# Patient Record
Sex: Male | Born: 1940 | Race: Black or African American | Hispanic: No | Smoking: Former smoker
Health system: Southern US, Community
[De-identification: ages and names within clinical notes are randomized; demographics above are authoritative.]

## PROBLEM LIST (undated history)

## (undated) DIAGNOSIS — K56609 Unspecified intestinal obstruction, unspecified as to partial versus complete obstruction: Secondary | ICD-10-CM

## (undated) DIAGNOSIS — R7303 Prediabetes: Secondary | ICD-10-CM

## (undated) DIAGNOSIS — I700 Atherosclerosis of aorta: Secondary | ICD-10-CM

## (undated) DIAGNOSIS — I10 Essential (primary) hypertension: Secondary | ICD-10-CM

## (undated) DIAGNOSIS — N401 Enlarged prostate with lower urinary tract symptoms: Secondary | ICD-10-CM

## (undated) DIAGNOSIS — E663 Overweight: Secondary | ICD-10-CM

## (undated) DIAGNOSIS — D573 Sickle-cell trait: Secondary | ICD-10-CM

## (undated) DIAGNOSIS — C61 Malignant neoplasm of prostate: Secondary | ICD-10-CM

## (undated) DIAGNOSIS — I493 Ventricular premature depolarization: Secondary | ICD-10-CM

## (undated) DIAGNOSIS — E785 Hyperlipidemia, unspecified: Secondary | ICD-10-CM

## (undated) DIAGNOSIS — H409 Unspecified glaucoma: Secondary | ICD-10-CM

## (undated) DIAGNOSIS — N400 Enlarged prostate without lower urinary tract symptoms: Secondary | ICD-10-CM

## (undated) DIAGNOSIS — E7800 Pure hypercholesterolemia, unspecified: Secondary | ICD-10-CM

## (undated) DIAGNOSIS — N138 Other obstructive and reflux uropathy: Secondary | ICD-10-CM

## (undated) DIAGNOSIS — Z86010 Personal history of colonic polyps: Secondary | ICD-10-CM

## (undated) DIAGNOSIS — T7840XA Allergy, unspecified, initial encounter: Secondary | ICD-10-CM

## (undated) DIAGNOSIS — L821 Other seborrheic keratosis: Secondary | ICD-10-CM

## (undated) DIAGNOSIS — Z860101 Personal history of adenomatous and serrated colon polyps: Secondary | ICD-10-CM

## (undated) DIAGNOSIS — N62 Hypertrophy of breast: Secondary | ICD-10-CM

## (undated) DIAGNOSIS — N189 Chronic kidney disease, unspecified: Secondary | ICD-10-CM

## (undated) DIAGNOSIS — I739 Peripheral vascular disease, unspecified: Secondary | ICD-10-CM

## (undated) DIAGNOSIS — F419 Anxiety disorder, unspecified: Secondary | ICD-10-CM

## (undated) DIAGNOSIS — J309 Allergic rhinitis, unspecified: Secondary | ICD-10-CM

## (undated) DIAGNOSIS — I509 Heart failure, unspecified: Secondary | ICD-10-CM

## (undated) DIAGNOSIS — G4730 Sleep apnea, unspecified: Secondary | ICD-10-CM

## (undated) DIAGNOSIS — R339 Retention of urine, unspecified: Secondary | ICD-10-CM

## (undated) DIAGNOSIS — J069 Acute upper respiratory infection, unspecified: Secondary | ICD-10-CM

## (undated) DIAGNOSIS — M549 Dorsalgia, unspecified: Secondary | ICD-10-CM

## (undated) HISTORY — DX: Prediabetes: R73.03

## (undated) HISTORY — DX: Hyperlipidemia, unspecified: E78.5

## (undated) HISTORY — DX: Overweight: E66.3

## (undated) HISTORY — DX: Atherosclerosis of aorta: I70.0

## (undated) HISTORY — DX: Hypertrophy of breast: N62

## (undated) HISTORY — DX: Allergy, unspecified, initial encounter: T78.40XA

## (undated) HISTORY — DX: Ventricular premature depolarization: I49.3

## (undated) HISTORY — PX: OTHER SURGICAL HISTORY: SHX169

## (undated) HISTORY — DX: Personal history of colonic polyps: Z86.010

## (undated) HISTORY — PX: POLYPECTOMY: SHX149

## (undated) HISTORY — PX: COLONOSCOPY: SHX174

## (undated) HISTORY — DX: Allergic rhinitis, unspecified: J30.9

## (undated) HISTORY — DX: Heart failure, unspecified: I50.9

## (undated) HISTORY — PX: COLONOSCOPY W/ BIOPSIES AND POLYPECTOMY: SHX1376

## (undated) HISTORY — PX: REFRACTIVE SURGERY: SHX103

## (undated) HISTORY — DX: Other seborrheic keratosis: L82.1

## (undated) HISTORY — DX: Anxiety disorder, unspecified: F41.9

## (undated) HISTORY — DX: Sickle-cell trait: D57.3

## (undated) HISTORY — DX: Peripheral vascular disease, unspecified: I73.9

## (undated) HISTORY — DX: Personal history of adenomatous and serrated colon polyps: Z86.0101

## (undated) HISTORY — DX: Benign prostatic hyperplasia without lower urinary tract symptoms: N40.0

## (undated) HISTORY — DX: Dorsalgia, unspecified: M54.9

## (undated) HISTORY — DX: Essential (primary) hypertension: I10

## (undated) HISTORY — DX: Pure hypercholesterolemia, unspecified: E78.00

## (undated) HISTORY — DX: Unspecified intestinal obstruction, unspecified as to partial versus complete obstruction: K56.609

## (undated) HISTORY — DX: Chronic kidney disease, unspecified: N18.9

## (undated) HISTORY — DX: Acute upper respiratory infection, unspecified: J06.9

---

## 2001-02-12 HISTORY — PX: PROSTATE BIOPSY: SHX241

## 2001-05-02 ENCOUNTER — Ambulatory Visit: Admission: RE | Admit: 2001-05-02 | Discharge: 2001-07-31 | Payer: Self-pay | Admitting: Radiation Oncology

## 2001-05-12 ENCOUNTER — Ambulatory Visit (HOSPITAL_COMMUNITY): Admission: RE | Admit: 2001-05-12 | Discharge: 2001-05-12 | Payer: Self-pay | Admitting: Urology

## 2004-07-11 ENCOUNTER — Ambulatory Visit: Payer: Self-pay | Admitting: Pulmonary Disease

## 2006-01-15 ENCOUNTER — Ambulatory Visit: Payer: Self-pay | Admitting: Pulmonary Disease

## 2006-02-19 ENCOUNTER — Ambulatory Visit: Payer: Self-pay | Admitting: Pulmonary Disease

## 2006-03-20 ENCOUNTER — Ambulatory Visit: Payer: Self-pay | Admitting: Gastroenterology

## 2006-04-15 ENCOUNTER — Encounter (INDEPENDENT_AMBULATORY_CARE_PROVIDER_SITE_OTHER): Payer: Self-pay | Admitting: *Deleted

## 2006-04-15 ENCOUNTER — Ambulatory Visit: Payer: Self-pay | Admitting: Gastroenterology

## 2006-11-22 ENCOUNTER — Ambulatory Visit: Payer: Self-pay | Admitting: Pulmonary Disease

## 2006-11-22 DIAGNOSIS — D126 Benign neoplasm of colon, unspecified: Secondary | ICD-10-CM | POA: Insufficient documentation

## 2006-11-22 DIAGNOSIS — C61 Malignant neoplasm of prostate: Secondary | ICD-10-CM

## 2006-11-22 DIAGNOSIS — K589 Irritable bowel syndrome without diarrhea: Secondary | ICD-10-CM | POA: Insufficient documentation

## 2006-11-22 DIAGNOSIS — F411 Generalized anxiety disorder: Secondary | ICD-10-CM | POA: Insufficient documentation

## 2006-11-22 DIAGNOSIS — N62 Hypertrophy of breast: Secondary | ICD-10-CM | POA: Insufficient documentation

## 2006-11-22 DIAGNOSIS — K649 Unspecified hemorrhoids: Secondary | ICD-10-CM | POA: Insufficient documentation

## 2007-01-23 ENCOUNTER — Telehealth (INDEPENDENT_AMBULATORY_CARE_PROVIDER_SITE_OTHER): Payer: Self-pay | Admitting: *Deleted

## 2007-02-24 ENCOUNTER — Encounter: Payer: Self-pay | Admitting: Pulmonary Disease

## 2007-04-03 ENCOUNTER — Ambulatory Visit: Payer: Self-pay | Admitting: Pulmonary Disease

## 2007-10-28 ENCOUNTER — Encounter: Payer: Self-pay | Admitting: Pulmonary Disease

## 2008-01-22 ENCOUNTER — Ambulatory Visit: Payer: Self-pay | Admitting: Pulmonary Disease

## 2008-01-22 DIAGNOSIS — I499 Cardiac arrhythmia, unspecified: Secondary | ICD-10-CM

## 2008-01-22 DIAGNOSIS — I10 Essential (primary) hypertension: Secondary | ICD-10-CM | POA: Insufficient documentation

## 2008-01-23 ENCOUNTER — Ambulatory Visit: Payer: Self-pay | Admitting: Pulmonary Disease

## 2008-01-25 LAB — CONVERTED CEMR LAB
AST: 21 units/L (ref 0–37)
Albumin: 3.8 g/dL (ref 3.5–5.2)
Basophils Absolute: 0 10*3/uL (ref 0.0–0.1)
Bilirubin, Direct: 0.1 mg/dL (ref 0.0–0.3)
Chloride: 105 meq/L (ref 96–112)
Creatinine, Ser: 1.4 mg/dL (ref 0.4–1.5)
Direct LDL: 140.5 mg/dL
Eosinophils Absolute: 0.1 10*3/uL (ref 0.0–0.7)
Eosinophils Relative: 1 % (ref 0.0–5.0)
GFR calc non Af Amer: 54 mL/min
Hemoglobin: 14.2 g/dL (ref 13.0–17.0)
Lymphocytes Relative: 40.2 % (ref 12.0–46.0)
MCV: 80.1 fL (ref 78.0–100.0)
Monocytes Absolute: 0.3 10*3/uL (ref 0.1–1.0)
Monocytes Relative: 6.3 % (ref 3.0–12.0)
Neutro Abs: 2.8 10*3/uL (ref 1.4–7.7)
Neutrophils Relative %: 52.4 % (ref 43.0–77.0)
Potassium: 4.2 meq/L (ref 3.5–5.1)
Sodium: 141 meq/L (ref 135–145)
Total CHOL/HDL Ratio: 5.4
Triglycerides: 52 mg/dL (ref 0–149)
WBC: 5.3 10*3/uL (ref 4.5–10.5)

## 2008-09-02 ENCOUNTER — Ambulatory Visit: Payer: Self-pay | Admitting: Pulmonary Disease

## 2008-09-02 DIAGNOSIS — E7800 Pure hypercholesterolemia, unspecified: Secondary | ICD-10-CM

## 2008-09-02 DIAGNOSIS — M5380 Other specified dorsopathies, site unspecified: Secondary | ICD-10-CM | POA: Insufficient documentation

## 2008-10-07 ENCOUNTER — Telehealth: Payer: Self-pay | Admitting: Pulmonary Disease

## 2008-10-11 LAB — CONVERTED CEMR LAB
BUN: 21 mg/dL (ref 6–23)
Basophils Absolute: 0 10*3/uL (ref 0.0–0.1)
CO2: 30 meq/L (ref 19–32)
Cholesterol: 177 mg/dL (ref 0–200)
Eosinophils Absolute: 0.1 10*3/uL (ref 0.0–0.7)
Glucose, Bld: 91 mg/dL (ref 70–99)
HCT: 41.7 % (ref 39.0–52.0)
HDL: 39.9 mg/dL (ref >39.00)
Lymphocytes Relative: 40.9 % (ref 12.0–46.0)
Lymphs Abs: 2.1 10*3/uL (ref 0.7–4.0)
MCV: 81.1 fL (ref 78.0–100.0)
Monocytes Absolute: 0.3 10*3/uL (ref 0.1–1.0)
Monocytes Relative: 6.4 % (ref 3.0–12.0)
Sodium: 145 meq/L (ref 135–145)
Total Bilirubin: 0.9 mg/dL (ref 0.3–1.2)
Total CHOL/HDL Ratio: 4
Triglycerides: 50 mg/dL (ref 0.0–149.0)
VLDL: 10 mg/dL (ref 0.0–40.0)
WBC: 5.1 10*3/uL (ref 4.5–10.5)

## 2008-11-19 ENCOUNTER — Encounter: Payer: Self-pay | Admitting: Pulmonary Disease

## 2009-04-20 ENCOUNTER — Encounter (INDEPENDENT_AMBULATORY_CARE_PROVIDER_SITE_OTHER): Payer: Self-pay | Admitting: *Deleted

## 2009-04-28 ENCOUNTER — Ambulatory Visit: Payer: Self-pay | Admitting: Pulmonary Disease

## 2009-04-30 LAB — CONVERTED CEMR LAB
ALT: 24 units/L (ref 0–53)
AST: 23 units/L (ref 0–37)
Alkaline Phosphatase: 76 units/L (ref 39–117)
Basophils Relative: 0.5 % (ref 0.0–3.0)
Chloride: 107 meq/L (ref 96–112)
Creatinine, Ser: 1.4 mg/dL (ref 0.4–1.5)
Eosinophils Absolute: 0.1 10*3/uL (ref 0.0–0.7)
Glucose, Bld: 92 mg/dL (ref 70–99)
HCT: 42.6 % (ref 39.0–52.0)
LDL Cholesterol: 131 mg/dL — ABNORMAL HIGH (ref 0–99)
Lymphocytes Relative: 38 % (ref 12.0–46.0)
MCHC: 32.9 g/dL (ref 30.0–36.0)
MCV: 81.2 fL (ref 78.0–100.0)
Monocytes Relative: 6.2 % (ref 3.0–12.0)
Platelets: 147 10*3/uL — ABNORMAL LOW (ref 150.0–400.0)
Potassium: 3.8 meq/L (ref 3.5–5.1)
RBC: 5.25 M/uL (ref 4.22–5.81)
RDW: 13.8 % (ref 11.5–14.6)
Sodium: 143 meq/L (ref 135–145)
Total CHOL/HDL Ratio: 4
Triglycerides: 64 mg/dL (ref 0.0–149.0)
VLDL: 12.8 mg/dL (ref 0.0–40.0)

## 2009-05-02 ENCOUNTER — Telehealth (INDEPENDENT_AMBULATORY_CARE_PROVIDER_SITE_OTHER): Payer: Self-pay | Admitting: *Deleted

## 2009-05-02 ENCOUNTER — Encounter (INDEPENDENT_AMBULATORY_CARE_PROVIDER_SITE_OTHER): Payer: Self-pay | Admitting: *Deleted

## 2009-05-09 ENCOUNTER — Encounter (INDEPENDENT_AMBULATORY_CARE_PROVIDER_SITE_OTHER): Payer: Self-pay

## 2009-05-10 ENCOUNTER — Ambulatory Visit: Payer: Self-pay | Admitting: Gastroenterology

## 2009-05-25 ENCOUNTER — Encounter: Payer: Self-pay | Admitting: Pulmonary Disease

## 2009-06-20 ENCOUNTER — Telehealth: Payer: Self-pay | Admitting: Pulmonary Disease

## 2009-11-16 ENCOUNTER — Telehealth: Payer: Self-pay | Admitting: Gastroenterology

## 2009-12-13 ENCOUNTER — Ambulatory Visit: Payer: Self-pay | Admitting: Pulmonary Disease

## 2009-12-26 ENCOUNTER — Telehealth: Payer: Self-pay | Admitting: Pulmonary Disease

## 2009-12-28 ENCOUNTER — Ambulatory Visit: Payer: Self-pay | Admitting: Cardiovascular Disease

## 2009-12-28 ENCOUNTER — Ambulatory Visit: Payer: Self-pay

## 2009-12-28 ENCOUNTER — Ambulatory Visit (HOSPITAL_COMMUNITY): Admission: RE | Admit: 2009-12-28 | Discharge: 2009-12-28 | Payer: Self-pay

## 2009-12-28 ENCOUNTER — Encounter: Payer: Self-pay | Admitting: Pulmonary Disease

## 2010-01-23 ENCOUNTER — Telehealth: Payer: Self-pay | Admitting: Pulmonary Disease

## 2010-02-10 ENCOUNTER — Telehealth: Payer: Self-pay | Admitting: Pulmonary Disease

## 2010-02-15 ENCOUNTER — Encounter: Payer: Self-pay | Admitting: Pulmonary Disease

## 2010-03-14 NOTE — Assessment & Plan Note (Signed)
Summary: cpx ///kp   CC:  8 month ROV & review of mult medical problems....  History of Present Illness: 70 y/o BM here for a follow up visit... he has multiple medical problems as noted below...    ~  he is followed Q46mo by the Urology division at [facility] for prostate cancer- bx 1/03 was + for sm volume prostate cancer, subseq bx 12/04 was neg, on active surveillance w/ PSA's in the 10-12 range and stable... last seen 9/09 & their note is reviewed... he's had some nocturia and freq- INTOL to Flomax & they have rec RAPAFLO but he hasn't tried it yet...   ~  April 28, 2009:  he saw DrBorden 10/10 w/ PSA=13, he wanted to repeat bx but pt declined, f/u eval sched for 4/11... BP has been borderline elevated but pt declined med Rx... we discussed further eval w/ 2DEcho to help him decide if med Rx indicated or not... he denies symptoms- no CP. palpit, SOB, etc... he is also due for f/u colonoscopy by DrJacobs (colon 3/08 w/ 10mm adenomatous polyp removed)...     Problem List:  HYPERTENSION (ICD-401.9) - hx borderline HBP prev Rx w/ Amlodipine5 but he didn't tolerate- c/o nightmares... he prefers to control this naturally w/ herbs, diet, low sodium, etc... home BP checks prev in the 140's/ 80's are now 130's/ 70's by his history...  ~  3/11:  BP= 150/90 today & he denies HA, fatigue, visual changes, CP, palipit, dizziness, syncope, dyspnea, edema, etc... we discussed 2DEcho to eval for LVH.  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  HYPERCHOLESTEROLEMIA, MILD (ICD-272.0) - hx mild elevation of TChol & LDL in the past- he prefers to control w/ diet + exercise therapy and doesn't want statin med Rx...   ~  FLP 12/09 showed TChol 207, Tg 52, HDL 38, LDL 141... he prefers diet.  ~  FLP 7/10 showed TChol 177, TG 50, HDL 40,  LDL 127... improved, continue diet Rx.  ~  FLP 3/11 showed TChol 191, TG 64, HDL 47, LDL 131     Hx of IRRITABLE BOWEL SYNDROME (ICD-564.1)  ADENOMATOUS COLONIC POLYP (ICD-211.3) - Colonoscopy by [facility] 3/08 w/ 10mm polyp in asc colon, removed w/ path= tubular adenoma;  repeat colon due 70yrs.  ~  7/10: he denies nausea, vomiting, heartburn, diarrhea, constipation, blood in stool, abdominal pain, swelling, gas...  ~  3/11:  repeat colonoscopy due per DrJacobs- refer to GI.  Hx of HEMORRHOIDS (ICD-455.6) - Hx hemorroid surg by DrGerkin 10/03     PROSTATE CANCER (ICD-185) - Dx w/ Prostate Ca 1/03 by DrPeterson & offered surg vs seeds;  second opinion w/ DrDalstedt 2/03 w/ long discussion re: options;  discussed seed Rx w/ DrGoodchild 3/03;  pt preferred herbal Rx- and takes [employer] & Immune Enhancers;  4th opinion w/ Dr Sinclair Grooms @ [facility] 8/04 w/ repeat bx which was neg & decision made for "watchful waiting" w/ q75mo follow up in [facility]- last seen 9/09 and PSA was 10.3.Marland Kitchen.  ~  7/10: pt informs me that he is no longer going to [facility] and wants a Emergency planning/management officer... refer to DrBorden at [facility]... we did his PSA= 13.85...  ~  3/11:  pt still on watchful waiting protocol per his decision... PSA today = 18.36... refer back  to DrBorden.  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 he has been taking an "immune enhancer" from DrNeilson in [place], [place] (pt asked to bring bottle in for [place] to review).     ANXIETY (ICD-300.00)   Allergies: 1)  ! Flomax 2)  ! Amlodipine Besylate (Amlodipine Besylate)  Comments:  Nurse/Medical Assistant: The patient's medications and allergies were reviewed with the patient and were updated in the Medication and Allergy Lists.  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 Hemorrhoidectomy - DrGerkin 10/03  Family History: Reviewed history from 09/02/2008 and no changes required. mother deceased in her late 7's from stroke hx of alcohol abuse father deceased age 42 from heart disease/hx of dm and hbp 1 brother alive age 64  hx of HBP 1 sister alive age 62  hx of HBP, DM 1 sister deceased age 79 from DM and dialysis  Social History: Reviewed history from 09/02/2008 and no changes required. quit smoking in 1971  smoked for 15 yrs  1/2 pack per week married to John [name] 1 son exercises  plays tennis 2-3 times per week no alcohol use  quit in 1980 quit using drugs in 1980  Review of Systems  The patient denies anorexia, fever, weight loss, weight gain, vision loss, decreased hearing, hoarseness, chest pain, syncope, dyspnea on exertion, peripheral edema, prolonged cough, headaches, hemoptysis, abdominal pain, melena, hematochezia, severe indigestion/heartburn, hematuria, incontinence, muscle weakness, suspicious skin lesions, transient blindness, difficulty walking, depression, unusual weight change, abnormal bleeding, enlarged lymph nodes, and angioedema.    Vital Signs:  Patient profile:   70 year old male Height:      75 inches Weight:      228.13 pounds BMI:     28.62 O2 Sat:      98 % on Room air Temp:     96.9 degrees F oral Pulse rate:   62 / minute BP sitting:   150 / 90  (right arm) Cuff size:   regular  Vitals Entered By: [name] CMA (April 28, 2009 9:37 AM)  O2 Sat at Rest %:  98 O2 Flow:  Room air CC: 8 month ROV & review of mult medical problems... Is Patient Diabetic?  No Pain Assessment Patient in pain? no      Comments meds updated today   Physical Exam  Additional Exam:  WD, Overweight, 70 y/o BM in NAD... GENERAL:  Alert & oriented; pleasant & cooperative... HEENT:  [place]/AT, EOM-wnl, PERRLA, EACs-clear, TMs-wnl, NOSE-clear, THROAT-clear & wnl. NECK:  Supple w/ fairROM; no JVD; normal carotid impulses w/o bruits; no thyromegaly or nodules palpated; no lymphadenopathy. CHEST:  Clear to P & A; without wheezes/ rales/ or rhonchi. HEART:  Regular Rhythm; without murmurs/ rubs/ or gallops. ABDOMEN:  Soft & nontender; normal bowel sounds; no organomegaly or masses detected. Rectal exam is deferred... EXT: without deformities, mild arthritic changes; no varicose veins/ venous insuffic/ or edema. NEURO:  CN's intact; motor testing normal; sensory testing normal; gait normal & balance OK. DERM:  No lesions noted; no rash etc...    CXR  Procedure  date:  04/28/2009  Findings:      CHEST - 2 VIEW Comparison: 01/22/2008   Findings:  The heart size and mediastinal contours are within normal limits.  Both lungs are clear.  The visualized skeletal structures are unremarkable.   IMPRESSION: No active cardiopulmonary disease.   Read By:  Jonne Ply,  M.D.   EKG  Procedure date:  04/28/2009  Findings:      Normal sinus rhythm with rate of:  60/ min...  Tracings show poor R progression & NSSTTWA...  SN   MISC. Report  Procedure date:  04/28/2009  Findings:      Lipid Panel (LIPID)   Cholesterol               191 mg/dL                   1-610   Triglycerides             64.0 mg/dL                  9.6-045.4   HDL                       09.81 mg/dL                 >19.14   LDL Cholesterol      [H]  782 mg/dL                   9-56  BMP (METABOL)   Sodium                    143 mEq/L                   135-145   Potassium                 3.8 mEq/L                   3.5-5.1   Chloride                  107 mEq/L                    96-112   Carbon Dioxide            32 mEq/L                    19-32   Glucose                   92 mg/dL                    21-30   BUN                       17 mg/dL                    8-65   Creatinine                1.4 mg/dL                   7.8-4.6   Calcium                   9.3 mg/dL                   9.6-29.5   GFR  65.16 mL/min                >60  Hepatic/Liver Function Panel (HEPATIC)   Total Bilirubin           0.6 mg/dL                   9.1-4.7   Direct Bilirubin          0.1 mg/dL                   8.2-9.5   Alkaline Phosphatase      76 U/L                      39-117   AST                       23 U/L                      0-37   ALT                       24 U/L                      0-53   Total Protein             7.7 g/dL                    6.2-1.3   Albumin                   4.0 g/dL                    0.8-6.5  Comments:      CBC Platelet w/Diff (CBCD)   White Cell Count          5.5 K/uL                    4.5-10.5   Red Cell Count            5.25 Mil/uL                 4.22-5.81   Hemoglobin                14.0 g/dL                   78.4-69.6   Hematocrit                42.6 %                      39.0-52.0   MCV                       81.2 fl                     78.0-100.0   Platelet Count       [L]  147.0 K/uL                  150.0-400.0   Neutrophil %              54.3 %                      43.0-77.0   Lymphocyte %  38.0 %                      12.0-46.0   Monocyte %                6.2 %                       3.0-12.0   Eosinophils%              1.0 %                       0.0-5.0   Basophils %               0.5 %                       0.0-3.0  Tests: (5)   FastTSH                   1.33 uIU/mL                 0.35-5.50  Prostate Specific Antigen (PSA)   PSA-Hyb              [H]  18.36 ng/mL                 0.10-4.00   Impression & Recommendations:  Problem # 1:  HYPERTENSION (ICD-401.9) BP is borderline, but he notes  better at home... we decided to check 2DEcho for LVH & let that guide therapy which he is reluctant to accept... Orders: T-2 View CXR (71020TC) TLB-Lipid Panel (80061-LIPID) TLB-BMP (Basic Metabolic Panel-BMET) (80048-METABOL) TLB-Hepatic/Liver Function Pnl (80076-HEPATIC) TLB-CBC Platelet - w/Differential (85025-CBCD) TLB-TSH (Thyroid Stimulating Hormone) (84443-TSH) TLB-PSA (Prostate Specific Antigen) (84153-PSA) 2 D Echo (2 D Echo)  Problem # 2:  HYPERCHOLESTEROLEMIA, MILD (ICD-272.0) He prefers to control w/ diet alone and declines med Rx for this problem...  Problem # 3:  ADENOMATOUS COLONIC POLYP (ICD-211.3) Due for f/u colon by DrJacobs... Orders: Gastroenterology Referral (GI)  Problem # 4:  PROSTATE CANCER (ICD-185) Rising PSA-  needs f/u DrBorden...  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  Problem # 6:  OTHER MEDICAL PROBLEMS AS NOTED>>>  Complete Medication List: 1)  Aspirin 81 Mg Tbec (Aspirin) .... Take 1 tab by mouth once daily.Marland KitchenMarland Kitchen 2)  Multiple Vitamin Tabs (Multiple vitamin) .... He takes several vitamin supplements 3)  Immune Enhancer  .... Take as directed by drneilson...  Other Orders: EKG w/ Interpretation (93000) Urology Referral (Urology)  Patient Instructions: 1)  Today we updated your med list- see below.... 2)  Today we did your follow up CXR, EKG, & fasting blood work... please call the "phone tree" in a few days for your lab results.Marland KitchenMarland Kitchen 3)  Continue your excellent exercise program, and be sure to elim salt from your diet.Marland KitchenMarland Kitchen 4)  We will arrange for a 2DEcho of your heart & we will call you w/ these results when avail (& determine if BP meds are needed). 5)  We will also set up a f/u appt w/ DrBorden for April.Marland KitchenMarland Kitchen  6)  We will also check w/ DrJacobs about the f/u colonoscopy based on the polyp you had in 2008... 7)  Call for any questions... 8)  Please schedule a follow-up appointment  in 6 months.   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  Tracings show poor R progression & NSSTTWA...  SN

## 2010-03-14 NOTE — Progress Notes (Signed)
Summary: results/cb  Phone Note Call from Patient Call back at Home Phone [phone]   Caller: wife John [name] Call For: John [name] Summary of Call: wants results of labs drawn 04/28/2009 Initial call taken by: Lacinda Axon,  Jun 20, 2009 12:13 PM  Follow-up for Phone Call        Pt had labs done 3/17- append says PT msg recorded but this is no longer available.  Please advise results, thanks [name]  Jun 20, 2009 1:35 PM   Additional Follow-up for Phone Call Additional follow up Details #1::        per SN----i called and spoke with pts wife and she is aware of results---per SN  slightly elevated ldl on diet alone---all other labs are normal except for the psa---very high at 18.36  she stated that this was very important and that they will follow up with urology for this---they will call for appt and she is aware that a copy of these labs have been forwarded to urology back in march when the labs were done. [name] CMA  Jun 20, 2009 3:26 PM

## 2010-03-14 NOTE — Assessment & Plan Note (Signed)
Summary: rov 6 months///kp   CC:  7-8 month ROV & review of mult medical problems....  History of Present Illness: 70 y/o [name] here for a follow up visit... he has multiple medical problems as noted below...    ~  he is followed Q3mo by the Urology division at [facility] for prostate cancer- bx 1/03 was + for sm volume prostate cancer, subseq bx 12/04 was neg, on active surveillance w/ PSA's in the 10-12 range and stable... last seen 9/09 & their note is reviewed... he's had some nocturia and freq- INTOL to Flomax & they have rec RAPAFLO but he hasn't tried it yet...   ~  April 28, 2009:  he saw DrBorden 10/10 w/ PSA=13, he wanted to repeat bx but pt declined, f/u eval sched for 4/11... BP has been borderline elevated but pt declined med Rx... we discussed further eval w/ 2DEcho to help him decide if med Rx indicated or not (he never went for the Echo)... he denies symptoms- no CP, palpit, SOB, etc... he is also due for f/u colonoscopy by DrJacobs (colon 3/08 w/ 10mm adenomatous polyp removed- f/u colon sched for pt & he cancelled the appt).   ~  December 13, 2009:  he had f/u DrBorden (PSA ~18) w/ repeat prostate bx= "no cancer there" per pt hx (we don't have results from Urology) & he notes f/u due soon... he notes a lipoma on left lat foot- reassured & offered surg referral if he wants... BP noted to be elev & we discussed adding DOXAZOSIN 8mg /d...    Problem List:  HYPERTENSION (ICD-401.9) - hx borderline HBP prev Rx w/ Amlodipine5 but he didn't tolerate- c/o nightmares... he prefers to control this naturally w/ herbs, diet, low sodium, etc... home BP checks prev in the 140's/ 80's are now 130's/ 70's by his history...  ~  3/11:  BP= 150/90 today & he denies HA, fatigue, visual changes, CP, palipit, dizziness, syncope, dyspnea, edema, etc... we discussed 2DEcho to eval for LVH (he never went).  ~  11/11:  BP up at 150/100 today & we discussed diet, no salt, & start DOXAZOSIN 8mg /d.  Hx of  PREMATURE VENTRICULAR CONTRACTIONS (ICD-427.69), & DYSRHYTHMIA, CARDIAC NOS (ICD-427.9) - on ASA 81mg /d... Remote hx of PVC's w/ eval 1986 showing rare PVC's on holter monitor;  no cardiac symptoms; EKG w/ L axis, poor R prog V1-3, NAD.  HYPERCHOLESTEROLEMIA, MILD (ICD-272.0) - hx mild elevation of TChol & LDL in the past- he prefers to control w/ diet + exercise therapy and doesn't want statin med Rx...   ~  FLP 12/09 showed TChol 207, Tg John, HDL 38, LDL 141... he prefers diet.  ~  FLP 7/10 showed TChol 177, TG 50, HDL 40, LDL 127... improved, continue diet Rx.  ~  FLP 3/11 showed TChol 191, TG 64, HDL 47, LDL 131     Hx of IRRITABLE BOWEL SYNDROME (ICD-564.1)  ADENOMATOUS COLONIC POLYP (ICD-211.3) - Colonoscopy by [facility] 3/08 w/ 10mm polyp in asc colon, removed w/ path= tubular adenoma;  repeat colon due 21yrs.  ~  7/10: he denies nausea, vomiting, heartburn, diarrhea, constipation, blood in stool, abdominal pain, swelling, gas...  ~  3/11:  repeat colonoscopy due per DrJacobs- referred to GI, but pt cancelled appt.  ~  11/11:  pt encouraged to set up appt for f/u colonoscopy- he has promised to resched.  Hx of HEMORRHOIDS (ICD-455.6) - Hx hemorroid surg by DrGerkin 10/03     PROSTATE CANCER (ICD-185) -  Dx w/ Prostate Ca 1/03 by DrPeterson & offered surg vs seeds;  second opinion w/ DrDalstedt 2/03 w/ long discussion re: options;  discussed seed Rx w/ DrGoodchild 3/03;  pt preferred herbal Rx- and takes [employer] & Immune Enhancers;  4th opinion w/ Dr Sinclair Grooms @ [facility] 8/04 w/ repeat bx which was neg & decision made for "watchful waiting" w/ q31mo follow up in [facility]- last seen 9/09 and PSA was 10.3.Marland Kitchen.  ~  7/10: pt informs me that he is no longer going to [facility] and wants a Emergency planning/management officer... refer to DrBorden at [facility]... we did his PSA= 13.85...  ~  3/11:  pt still on watchful waiting protocol per his decision... PSA today = 18.36... refer back to DrBorden> note indicates that he  wants to repeat bx & pt agreed> we don't have results but pt indicates that "there was no cancer there".  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 he has been taking an "immune enhancer" from DrNeilson in [place], [place] (pt asked to bring bottle in for [place] to review).     ANXIETY (ICD-300.00)  Health Maint:  pt still taking supplemental "Immune Enhancer" - he states "I can tell a difference, I have more energy".   Allergies (verified): 1)  ! Flomax 2)  ! Amlodipine Besylate (Amlodipine Besylate)  Comments:  Nurse/Medical Assistant: The patient's medications and allergies were reviewed with the patient and were updated in the Medication and Allergy Lists. Boone Master CNA/MA  December 13, 2009 10:43 AM   Past History:  Past Medical History: HYPERTENSION (ICD-401.9) Hx of PREMATURE VENTRICULAR CONTRACTIONS (ICD-427.69) HYPERCHOLESTEROLEMIA, MILD (ICD-272.0) Hx of IRRITABLE BOWEL SYNDROME (ICD-564.1) ADENOMATOUS COLONIC POLYP (ICD-211.3) Hx of HEMORRHOIDS (ICD-455.6) PROSTATE CANCER (ICD-185) Hx of GYNECOMASTIA (ICD-611.1) ANXIETY (ICD-300.00)  Past Surgical History: FNA L Breast 5/01 in [place] - neg (pt had gynecomastia) Prostate Biopsy - DrPeterson 1/03 +prostate ca; repeat bx 10/04 DrWallen [facility] neg. Hemorrhoidectomy - DrGerkin 10/03  Family History: Reviewed history from 04/28/2009 and no changes required. mother deceased in her late 50's from stroke hx of alcohol abuse father deceased age 38 from heart disease/hx of dm and hbp 1 brother alive age 87  hx of HBP 1 sister alive age 27  hx of HBP, DM 1 sister deceased age 76 from DM and dialysis  Social History: Reviewed history from 04/28/2009 and no changes required. quit smoking in 1971  smoked for 15 yrs  1/2 pack per week married to John [name] 1 son exercises  plays tennis 2-3 times per week no alcohol use  quit in 1980 quit using drugs in  1980  Review of Systems      See HPI  The patient denies anorexia, fever, weight loss, weight gain, vision loss, decreased hearing, hoarseness, chest pain, syncope, dyspnea on exertion, peripheral edema, prolonged cough, headaches, hemoptysis, abdominal pain, melena, hematochezia, severe indigestion/heartburn, hematuria, incontinence, muscle weakness, suspicious skin lesions, transient blindness, difficulty walking, depression, unusual weight change, abnormal bleeding, enlarged lymph nodes, and angioedema.    Vital Signs:  Patient profile:   70 year old male Height:      75 inches Weight:      222.25 pounds BMI:     27.88 O2 Sat:      100 % on Room air Temp:     98.8 degrees F oral Pulse rate:   72 / minute BP sitting:   150 / 100  (left arm)  Cuff size:   regular  Vitals Entered By: Boone Master CNA/MA (December 13, 2009 10:43 AM)  O2 Flow:  Room air  Physical Exam  Additional Exam:  WD, Overweight, 70 y/o [name] in NAD... GENERAL:  Alert & oriented; pleasant & cooperative... HEENT:  [place]/AT, EOM-wnl, PERRLA, EACs-clear, TMs-wnl, NOSE-clear, THROAT-clear & wnl. NECK:  Supple w/ fairROM; no JVD; normal carotid impulses w/o bruits; no thyromegaly or nodules palpated; no lymphadenopathy. CHEST:  Clear to P & A; without wheezes/ rales/ or rhonchi. HEART:  Regular Rhythm; without murmurs/ rubs/ or gallops. ABDOMEN:  Soft & nontender; normal bowel sounds; no organomegaly or masses detected. Rectal exam is deferred... EXT: without deformities, mild arthritic changes; no varicose veins/ venous insuffic/ or edema. NEURO:  CN's intact; motor testing normal; sensory testing normal; gait normal & balance OK. DERM:  No lesions noted; no rash etc...    Impression & Recommendations:  Problem # 1:  HYPERTENSION (ICD-401.9) BP is higher & he needs meds but has been reluctant to start anything & never stays on the prev meds... we discussed these issues & I have rec DOXAZOSIN 8mg  for BP &  prostate... monitorBP at home... he needs the 2DEcho & we will try to resched this for him. His updated medication list for this problem includes:    Doxazosin Mesylate 8 Mg Tabs (Doxazosin mesylate) .Marland Kitchen... Take 1 tab by mouth once daily  Orders: 2 D Echo (2 D Echo)  Problem # 2:  HYPERCHOLESTEROLEMIA, MILD (ICD-272.0) On diet alone>  refuses med Rx.  Problem # 3:  ADENOMATOUS COLONIC POLYP (ICD-211.3) Overdue for f/u colon & needs to resched this important procedure.  Problem # 4:  PROSTATE CANCER (ICD-185) Followed by DrBorden w/ bxc repeated ?4/11> we don't have results & he will f/u w/ Urology.  Problem # 5:  ANXIETY (ICD-300.00) Aware>  pt declines meds for this...  Problem # 6:  OTHER MEDICAL PROBLEMS AS NOTED>>> He declines flu vaccine...  Complete Medication List: 1)  Aspirin 81 Mg Tbec (Aspirin) .... Take 1 tab by mouth once daily.Marland KitchenMarland Kitchen 2)  Multiple Vitamin Tabs (Multiple vitamin) .... He takes several vitamin supplements 3)  Immune Enhancer  .... Take as directed by drneilson... 4)  Doxazosin Mesylate 8 Mg Tabs (Doxazosin mesylate) .... Take 1 tab by mouth once daily  Patient Instructions: 1)  Today we updated your med list- see below.... 2)  We decided to add DOXAZOSIN- take one tab daily yo help your urination & BP... 3)  We will arrange for the 2DEchocardiogram at the [facility] center on [address]... we will call you w/ this result when avail.Marland KitchenMarland Kitchen 4)  Remember: no salt/ sodium, keep the wt down... 5)  Call for any questions.Marland KitchenMarland Kitchen 6)  Please schedule a follow-up appointment in 6 months, with FASTING blood work at that time... Prescriptions: DOXAZOSIN MESYLATE 8 MG TABS (DOXAZOSIN MESYLATE) take 1 tab by mouth once daily  #30 x 12   Entered and Authorized by:   Michele Mcalpine MD   Signed by:   Michele Mcalpine MD on 12/13/2009   Method used:   Print then Give to Patient   RxID:   [id number]

## 2010-03-14 NOTE — Letter (Signed)
Summary: Previsit letter  [facility]  [address] [place], [place] [address]   Phone: [phone]  Fax: [phone]       05/02/2009 MRN: [id number]  [facility] [address] [place], [place]  [address]  Dear John [name],  Welcome to the Gastroenterology Division at [facility].    You are scheduled to see a nurse for your pre-procedure visit on 05/10/2009 at 1:30PM on the 3rd floor at [facility], [address]  We ask that you try to arrive at our office 15 minutes prior to your appointment time to allow for check-in.  Your nurse visit will consist of discussing your medical and surgical history, your immediate family medical history, and your medications.    Please bring a complete list of all your medications or, if you prefer, bring the medication bottles and we will list them.  We will need to be aware of both prescribed and over the counter drugs.  We will need to know exact dosage information as well.  If you are on blood thinners (Coumadin, Plavix, Aggrenox, Ticlid, etc.) please call our office today/prior to your appointment, as we need to consult with your physician about holding your medication.   Please be prepared to read and sign documents such as consent forms, a financial agreement, and acknowledgement forms.  If necessary, and with your consent, a friend or relative is welcome to sit-in on the nurse visit with you.  Please bring your insurance card so that we may make a copy of it.  If your insurance requires a referral to see a specialist, please bring your referral form from your primary care physician.  No co-pay is required for this nurse visit.     If you cannot keep your appointment, please call [phone] to cancel or reschedule prior to your appointment date.  This allows [place] the opportunity to schedule an appointment for another patient in need of care.    Thank you for choosing [facility] for your  medical needs.  We appreciate the opportunity to care for you.  Please visit [place] at our website  to learn more about our practice.                     Sincerely.                                                                                                                   The Gastroenterology Division

## 2010-03-14 NOTE — Progress Notes (Signed)
Summary: fax   [id number]  Phone Note From Other Clinic Call back at [phone] ex [phone]   Caller: [facility]  connie Call For: nadel Summary of Call: received fax with two patients name ( John [name] and John [name] need clarification . Initial call taken by: Rickard Patience,  May 02, 2009 1:17 PM  Follow-up for Phone Call        [facility]. Arman Filter LPN  May 02, 2009 1:24 PM   Additional Follow-up for Phone Call Additional follow up Details #1::        Spoke with Junious Dresser in med records.  She states that they only received referal letter for this pt and labs.  They are needing last ov and cxr report faxed.  I faxed these both to Connnie's attn at [phone]

## 2010-03-14 NOTE — Miscellaneous (Signed)
Summary: [facility] previsit  Clinical Lists Changes  Medications: Added new medication of MOVIPREP 100 GM  SOLR (PEG-KCL-NACL-NASULF-NA ASC-C) As per prep instructions. - Signed Rx of MOVIPREP 100 GM  SOLR (PEG-KCL-NACL-NASULF-NA ASC-C) As per prep instructions.;  #1 x 0;  Signed;  Entered by: Ulis Rias RN;  Authorized by: Rachael Fee MD;  Method used: [facility]  [address] [phone]*, [address], [place], [place], [place]  [id number], Ph: [phone] or [phone], Fax: [phone] Observations: Added new observation of ALLERGY REV: Done (05/10/2009 13:19)    Prescriptions: MOVIPREP 100 GM  SOLR (PEG-KCL-NACL-NASULF-NA ASC-C) As per prep instructions.  #1 x 0   Entered by:   Ulis Rias RN   Authorized by:   Rachael Fee MD   Signed by:   Ulis Rias RN on 05/10/2009   Method used:   [facility]        [facility]  [address] [phone]* (retail)       [address]       [place], [place]  [id number]       Ph: [phone] or [phone]       Fax: [phone]   RxID:   [id number]

## 2010-03-14 NOTE — Progress Notes (Signed)
Summary: test  Phone Note Call from Patient Call back at Home Phone [phone]   Caller: spouse-Brenda Lapine  Call For: Elexia Friedt Summary of Call: need to schedule rountine test Initial call taken by: Lacinda Axon,  December 26, 2009 12:37 PM  Follow-up for Phone Call        Pt wife is concerned about her husbands behavior. She states sometimes it seems liek he has 2 different personalitis. He has been having alot of mood swings, and has ben acting strange. She is requested that he be tested for sickle cell anemia,bipolar disorder, and  alzehimers. Pt also wants the same test order for her because she states he will not get the test done if she doesnt also get them done. Please advise.Carron Curie CMA  December 26, 2009 12:52 PM   Additional Follow-up for Phone Call Additional follow up Details #1::        per SN--pts wife is requesting neurology appt for eval of mental status changes---order has been placed in the computer for this---pts wife is aware of this and is requesting a copy of pts last PSA [name] CMA  December 26, 2009 3:17 PM   New Problems: ALTERED MENTAL STATUS (ICD-780.97)   New Problems: ALTERED MENTAL STATUS (ICD-780.97)

## 2010-03-14 NOTE — Letter (Signed)
Summary: [facility]  [facility]   Imported By: Lennie Odor 06/07/2009 11:36:46  _____________________________________________________________________  External Attachment:    Type:   Image     Comment:   External Document

## 2010-03-14 NOTE — Letter (Signed)
Summary: Colonoscopy Letter  [facility]  [address] [place], [place] [address]   Phone: [phone]  Fax: [phone]      April 20, 2009 MRN: [id number]   John [name] [address] [place], [place]  [address]   Dear [name],   According to your medical record, it is time for you to schedule a Colonoscopy. The American Cancer Society recommends this procedure as a method to detect early colon cancer. Patients with a family history of colon cancer, or a personal history of colon polyps or inflammatory bowel disease are at increased risk.  This letter has been generated based on the recommendations made at the time of your procedure. If you feel that in your particular situation this may no longer apply, please contact our office.  Please call our office at [phone] to schedule this appointment or to update your records at your earliest convenience.  Thank you for cooperating with [place] to provide you with the very best care possible.   Sincerely,  Rachael Fee, M.D.  [facility] Gastro[facility] Division [phone]

## 2010-03-14 NOTE — Letter (Signed)
Summary: [facility] Instructions  [facility]  [address] [place], [place] [address]   Phone: [phone]  Fax: [phone]       AUGUSTA HILBERT    03-Mar-1940    MRN: [id number]        Procedure Day Dorna Bloom:  [facility]  05/25/09     Arrival Time:  8:30AM     Procedure Time:  9:30AM     Location of Procedure:                    _X _  [facility] (4th Floor)                        PREPARATION FOR COLONOSCOPY WITH MOVIPREP   Starting 5 days prior to your procedure 05/20/09 do not eat nuts, seeds, popcorn, corn, beans, peas,  salads, or any raw vegetables.  Do not take any fiber supplements (e.g. Metamucil, Citrucel, and Benefiber).  THE DAY BEFORE YOUR PROCEDURE         DATE: 05/24/09  DAY: TUESDAY  1.  Drink clear liquids the entire day-NO SOLID FOOD  2.  Do not drink anything colored red or purple.  Avoid juices with pulp.  No orange juice.  3.  Drink at least 64 oz. (8 glasses) of fluid/clear liquids during the day to prevent dehydration and help the prep work efficiently.  CLEAR LIQUIDS INCLUDE: Water Jello Ice Popsicles Tea (sugar ok, no milk/cream) Powdered fruit flavored drinks Coffee (sugar ok, no milk/cream) Gatorade Juice: apple, white grape, white cranberry  Lemonade Clear bullion, consomm, broth Carbonated beverages (any kind) Strained chicken noodle soup Hard Candy                             4.  In the morning, mix first dose of MoviPrep solution:    Empty 1 Pouch A and 1 Pouch B into the disposable container    Add lukewarm drinking water to the top line of the container. Mix to dissolve    Refrigerate (mixed solution should be used within 24 hrs)  5.  Begin drinking the prep at 5:00 p.m. The MoviPrep container is divided by 4 marks.   Every 15 minutes drink the solution down to the next mark (approximately 8 oz) until the full liter is complete.   6.  Follow completed prep with 16 oz of clear liquid of your choice  (Nothing red or purple).  Continue to drink clear liquids until bedtime.  7.  Before going to bed, mix second dose of MoviPrep solution:    Empty 1 Pouch A and 1 Pouch B into the disposable container    Add lukewarm drinking water to the top line of the container. Mix to dissolve    Refrigerate  THE DAY OF YOUR PROCEDURE      DATE: 05/25/09 DAY: WEDNESDAY  Beginning at 4:30a.m. (5 hours before procedure):         1. Every 15 minutes, drink the solution down to the next mark (approx 8 oz) until the full liter is complete.  2. Follow completed prep with 16 oz. of clear liquid of your choice.    3. You may drink clear liquids until 7:30AM (2 HOURS BEFORE PROCEDURE).   MEDICATION INSTRUCTIONS  Unless otherwise instructed, you should take regular prescription medications with a small sip of water   as early as possible the morning of  your procedure.         OTHER INSTRUCTIONS  You will need a responsible adult at least 68 years of age to accompany you and drive you home.   This person must remain in the waiting room during your procedure.  Wear loose fitting clothing that is easily removed.  Leave jewelry and other valuables at home.  However, you may wish to bring a book to read or  an iPod/MP3 player to listen to music as you wait for your procedure to start.  Remove all body piercing jewelry and leave at home.  Total time from sign-in until discharge is approximately 2-3 hours.  You should go home directly after your procedure and rest.  You can resume normal activities the  day after your procedure.  The day of your procedure you should not:   Drive   Make legal decisions   Operate machinery   Drink alcohol   Return to work  You will receive specific instructions about eating, activities and medications before you leave.    The above instructions have been reviewed and explained to me by   Ulis Rias RN  May 10, 2009 2:07 PM     I fully understand  and can verbalize these instructions _____________________________ Date _________

## 2010-03-14 NOTE — Progress Notes (Signed)
Summary: Reschedule Colonoscopy  Phone Note Outgoing Call Call back at Home Phone [phone]   Call placed by: Harlow Mares CMA Duncan Dull),  November 16, 2009 8:45 AM Call placed to: Patient Summary of Call: patient is due for a colonoscopy he had it scheduled and cxed I have tried to call the patient to reschedule but his home number is disconnected. We will mail him a reminder letter.  Initial call taken by: Harlow Mares CMA (AAMA),  November 16, 2009 8:46 AM

## 2010-03-16 NOTE — Progress Notes (Signed)
Summary: rx questions  Phone Note Call from Patient Call back at Home Phone [phone]   Caller: Wyline Copas Call For: nadel Summary of Call: questions re: rx's. call home # first then cell [phone] Initial call taken by: Tivis Ringer, CNA,  February 10, 2010 8:47 AM  Follow-up for Phone Call        Pt wife wanted to know what the name of new BP medication the pt was started on. I advised it was doxazosin.Carron Curie CMA  February 10, 2010 9:32 AM

## 2010-03-16 NOTE — Progress Notes (Signed)
Summary: echo results  Phone Note Call from Patient Call back at Home Phone [phone]   Caller: Patient Call For: nadel Summary of Call: Requests results of 2-d echo. Initial call taken by: Darletta Moll,  January 23, 2010 11:48 AM  Follow-up for Phone Call        Pt informed of SN recs on 12/28/09 append to 2D echo. Zackery Barefoot CMA  January 23, 2010 2:14 PM

## 2010-03-16 NOTE — Letter (Signed)
Summary: [facility]   Imported By: Sherian Rein 02/24/2010 12:37:23  _____________________________________________________________________  External Attachment:    Type:   Image     Comment:   External Document

## 2010-05-22 ENCOUNTER — Telehealth: Payer: Self-pay | Admitting: Pulmonary Disease

## 2010-05-22 NOTE — Telephone Encounter (Signed)
Spoke w/ pt and he was calling to set up an apt w/ Dr. Christella Hartigan for his colonoscopy. Pt advised me he already see Dr. Christella Hartigan just needs an apt. I advised pt he needs to call up to their office to make that apt. Pt verbalized understanindg

## 2010-05-31 ENCOUNTER — Ambulatory Visit (AMBULATORY_SURGERY_CENTER): Payer: Medicare Other | Admitting: *Deleted

## 2010-05-31 VITALS — Ht 75.0 in | Wt 228.0 lb

## 2010-05-31 DIAGNOSIS — Z86010 Personal history of colonic polyps: Secondary | ICD-10-CM

## 2010-06-12 ENCOUNTER — Telehealth: Payer: Self-pay | Admitting: Gastroenterology

## 2010-06-12 ENCOUNTER — Ambulatory Visit: Payer: Self-pay | Admitting: Pulmonary Disease

## 2010-06-12 NOTE — Telephone Encounter (Signed)
[facility] [address].  Pt notified.  Ezra Sites

## 2010-06-13 ENCOUNTER — Encounter: Payer: Self-pay | Admitting: Gastroenterology

## 2010-06-14 ENCOUNTER — Ambulatory Visit (AMBULATORY_SURGERY_CENTER): Payer: Medicare Other | Admitting: Gastroenterology

## 2010-06-14 ENCOUNTER — Telehealth: Payer: Self-pay | Admitting: Gastroenterology

## 2010-06-14 ENCOUNTER — Encounter: Payer: Self-pay | Admitting: Gastroenterology

## 2010-06-14 VITALS — BP 164/97 | HR 65 | Temp 98.1°F | Resp 14 | Ht 75.0 in | Wt 215.0 lb

## 2010-06-14 DIAGNOSIS — D126 Benign neoplasm of colon, unspecified: Secondary | ICD-10-CM

## 2010-06-14 DIAGNOSIS — Z860100 Personal history of colon polyps, unspecified: Secondary | ICD-10-CM

## 2010-06-14 DIAGNOSIS — Z1211 Encounter for screening for malignant neoplasm of colon: Secondary | ICD-10-CM

## 2010-06-14 MED ORDER — SODIUM CHLORIDE 0.9 % IV SOLN: 500.0000 mL | INTRAVENOUS | Status: DC

## 2010-06-14 NOTE — Telephone Encounter (Signed)
1440--spouse called back to [facility]--spoke with Everlean Alstrom RN. Spouse instructed that all foods pt could eat are on the blue/green discharge instruction sheets, and she stated she realized that after she read the papers after she called. Spouse with no further questions

## 2010-06-14 NOTE — Progress Notes (Signed)
Iv tubing disconnected at iv catheter site, iv fluids running onto bed, unsure of how much medicine pt actually received. Tubing connected and pt given an additional 6 mg Versed, 3 mg and 3 mg 2 minutes apart and became more drowsy then sleepy

## 2010-06-14 NOTE — Patient Instructions (Signed)
Please read the handout given to you by your recovery room nurse.   The biopsy results will be mailed to you within 2 weeks.   Resume your daily medications.  If you have any questions or concerns, please call [phone]. Thank-you.

## 2010-06-14 NOTE — Telephone Encounter (Signed)
Tried home number and number given in admitting today [phone]---home number in computer disconnected and [phone] was a machine and had no ID so no message left

## 2010-06-21 ENCOUNTER — Ambulatory Visit (INDEPENDENT_AMBULATORY_CARE_PROVIDER_SITE_OTHER): Payer: Medicare Other | Admitting: Pulmonary Disease

## 2010-06-21 ENCOUNTER — Other Ambulatory Visit (INDEPENDENT_AMBULATORY_CARE_PROVIDER_SITE_OTHER): Payer: Medicare Other

## 2010-06-21 DIAGNOSIS — F411 Generalized anxiety disorder: Secondary | ICD-10-CM

## 2010-06-21 DIAGNOSIS — I10 Essential (primary) hypertension: Secondary | ICD-10-CM

## 2010-06-21 DIAGNOSIS — E7800 Pure hypercholesterolemia, unspecified: Secondary | ICD-10-CM

## 2010-06-21 DIAGNOSIS — I4949 Other premature depolarization: Secondary | ICD-10-CM

## 2010-06-21 DIAGNOSIS — D573 Sickle-cell trait: Secondary | ICD-10-CM

## 2010-06-21 DIAGNOSIS — D126 Benign neoplasm of colon, unspecified: Secondary | ICD-10-CM

## 2010-06-21 DIAGNOSIS — C61 Malignant neoplasm of prostate: Secondary | ICD-10-CM

## 2010-06-21 DIAGNOSIS — Z0110 Encounter for examination of ears and hearing without abnormal findings: Secondary | ICD-10-CM

## 2010-06-21 LAB — CBC WITH DIFFERENTIAL/PLATELET
Basophils Absolute: 0 10*3/uL (ref 0.0–0.1)
Basophils Relative: 0.5 % (ref 0.0–3.0)
Eosinophils Relative: 0.8 % (ref 0.0–5.0)
HCT: 42.6 % (ref 39.0–52.0)
Hemoglobin: 14.6 g/dL (ref 13.0–17.0)
Lymphocytes Relative: 41.4 % (ref 12.0–46.0)
Lymphs Abs: 2 10*3/uL (ref 0.7–4.0)
Monocytes Relative: 5.4 % (ref 3.0–12.0)
Neutro Abs: 2.6 10*3/uL (ref 1.4–7.7)
RBC: 5.23 Mil/uL (ref 4.22–5.81)
RDW: 14.6 % (ref 11.5–14.6)
WBC: 4.9 10*3/uL (ref 4.5–10.5)

## 2010-06-21 LAB — LIPID PANEL
HDL: 46.7 mg/dL (ref >39.00)
Triglycerides: 40 mg/dL (ref 0.0–149.0)
VLDL: 8 mg/dL (ref 0.0–40.0)

## 2010-06-21 LAB — BASIC METABOLIC PANEL
BUN: 17 mg/dL (ref 6–23)
CO2: 30 mEq/L (ref 19–32)
Calcium: 9.2 mg/dL (ref 8.4–10.5)
Creatinine, Ser: 1.4 mg/dL (ref 0.4–1.5)
GFR: 62.86 mL/min (ref >60.00)
Glucose, Bld: 89 mg/dL (ref 70–99)
Sodium: 141 mEq/L (ref 135–145)

## 2010-06-21 LAB — HEPATIC FUNCTION PANEL
Albumin: 4 g/dL (ref 3.5–5.2)
Total Protein: 6.9 g/dL (ref 6.0–8.3)

## 2010-06-21 NOTE — Patient Instructions (Signed)
Today we updated your med list in our EPIC system...    Be sure to take the Doxazosin daily to protect your heart...  Today we did your follow up fasting blood work...    Please call the PHONE TREE in a few days for your results...    Dial [id number] & when prompted enter your patient number followed by the # symbol...    Your patient number is:  [id number]#  We included a blood test to check for sickle trait per your request...  Call for any problems...  Let's plan a follow up visit in 6 months.Marland KitchenMarland Kitchen

## 2010-06-21 NOTE — Progress Notes (Signed)
Subjective:    Patient ID: John [name], male    DOB: 1940/08/25, 70 y.o.   MRN: [id number]  HPI 70 y/o BM here for a follow up visit... he has multiple medical problems as noted below...   ~  he is followed Q59mo by the Urology division at [facility] for prostate cancer- bx 1/03 was + for sm volume prostate cancer, subseq bx 12/04 was neg, on active surveillance w/ PSA's in the 10-12 range and stable... last seen 9/09 & their note is reviewed... he's had some nocturia and freq- INTOL to Flomax & they have rec RAPAFLO but he hasn't tried it yet...  ~  April 28, 2009:  he saw DrBorden 10/10 w/ PSA=13, he wanted to repeat bx but pt declined, f/u eval sched for 4/11... BP has been borderline elevated but pt declined med Rx... we discussed further eval w/ 2DEcho to help him decide if med Rx indicated or not (he never went for the Echo)... he denies symptoms- no CP, palpit, SOB, etc... he is also due for f/u colonoscopy by DrJacobs (colon 3/08 w/ 10mm adenomatous polyp removed- f/u colon sched for pt & he cancelled the appt).  ~  December 13, 2009:  he had f/u DrBorden (PSA~18) w/ repeat prostate bx= "no cancer there" per pt hx (we don't have results from Urology) & he notes f/u due soon... he notes a lipoma on left lat foot- reassured & offered surg referral if he wants... BP noted to be elev & we discussed adding DOXAZOSIN 8mg /d...  ~  Jun 21, 2010:  79mo ROV & his only med is ASA 81mg  & Doxazosin 8mg /d> problem is he's not taking it every day & BP today= 160/90 & I told him he is increasing his stroke risk by not taking it every day! He points out that he remains asymptoatic w/o CP/ palpit/ dizzy/ SOB/ edmea/ or cerebral ischemic symptoms... He had 2DEcho 11/11 showing norm LV size & func w/ EF=55-60% & no regional wall motion problems, mild LA dil, otherw normal study...  He saw DrBorden 1/12 (he had surveillenc bx 6/11 when PSA rose to 16= low vol Gleason 6 tumor & they elected to continue watchful waiting)  & his f/u PSA was 15, continuing observation + added Finasteride for BPH symptoms but he didn't stick w/ it...  He also had f/u colonoscopy 5/12 by DrJacobs w/ 2 sm polyps removed= 1 was a tubular adenoma, f/u rec 41yrs...        Problem List:  HYPERTENSION (ICD-401.9) - he started on DOXAZOSIN 8mg /d 11/11 for elev BP & prostate symptoms;  He was intol to Norvasc in the past c/o nightmares while on this med;  He prefers to control BP w/ herbs, diet, low sodium, etc... ~  3/11:  BP= 150/90 today & he denies HA, fatigue, visual changes, CP, palipit, dizziness, syncope, dyspnea, edema, etc... we discussed 2DEcho to eval for LVH ==> finall done 11/11 & was WNL, no LVH etc... ~  11/11:  BP up at 150/100 today & we discussed diet, no salt, & start DOXAZOSIN 8mg /d. ~  5/12:  BP= 160/90 & he admits to not taking the Doxazosin regularly, asked to do so (so we can assess response)...  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 Remote hx of PVC's w/ eval 1986 showing rare PVC's on holter monitor;  no cardiac symptoms; EKG w/ L axis, poor R prog V1-3, NAD.  HYPERCHOLESTEROLEMIA, MILD (ICD-272.0) -  hx mild elevation of TChol & LDL in the past- he prefers to control w/ diet + exercise therapy and doesn't want statin med Rx...  ~  FLP 12/09 showed TChol 207, Tg 52, HDL 38, LDL 141... he prefers diet. ~  FLP 7/10 showed TChol 177, TG 50, HDL 40, LDL 127 ~  FLP 3/11 showed TChol 191, TG 64, HDL 47, LDL 131 ~  FLP 5/12 showed TChol 181, TG 40, HDL 47, LDL 126... He continues to refuse med rx.     Hx of IRRITABLE BOWEL SYNDROME (ICD-564.1)  ADENOMATOUS COLONIC POLYP (ICD-211.3) - Colonoscopy by [facility] 3/08 w/ 10mm polyp in asc colon, removed w/ path= tubular adenoma;  repeat colon due 95yrs. ~  7/10: he denies nausea, vomiting, heartburn, diarrhea, constipation, blood in stool, abdominal pain, swelling, gas... ~  5/12: pt finally had his f/u  colonoscopy> 2 polyps- one tubular adenoma w/ f/u suggested 3yrs...  Hx of HEMORRHOIDS (ICD-455.6) - Hx hemorroid surg by DrGerkin 10/03     PROSTATE CANCER (ICD-185) - Dx w/ Prostate Ca 1/03 by DrPeterson & offered surg vs seeds;  second opinion w/ DrDalstedt 2/03 w/ long discussion re: options;  discussed seed Rx w/ DrGoodchild 3/03;  pt preferred herbal Rx- and takes [employer] & Immune Enhancers;  4th opinion w/ Dr Sinclair Grooms @ [facility] 8/04 w/ repeat bx which was neg & decision made for "watchful waiting" w/ q21mo follow up in [facility]- last seen 9/09 and PSA was 10.3.Marland Kitchen. ~  7/10: pt informs me that he is no longer going to [facility] and wants a Emergency planning/management officer... refer to DrBorden at [facility]... we did his PSA= 13.85... ~  3/11:  pt still on watchful waiting protocol per his decision... PSA today = 18.36... refer back to DrBorden> note indicates that he wants to repeat bx & pt agreed> we don't have results but pt indicates that "there was no cancer there". ~  1/12:  f/u note from DrBorden reviewed> continues on watchful waiting, tried Finasteride for BPH symptoms...  Hx of GYNECOMASTIA (ICD-611.1) - Eval for L Gynecomastia 5/01 in [place]; FNA bx was neg... he has been taking an "immune enhancer" from DrNeilson in [place], [place] (pt asked to bring bottle in for [place] to review).     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  Health Maint:  pt still taking supplemental "Immune Enhancer" - he states "I can tell a difference, I have more energy".   Past Surgical History  Procedure Date  . Colonoscopy   . Polypectomy   . Prostate biopsy 02/2001    repeat in 11/2002 Dr. Sinclair Grooms at [facility]    Outpatient Encounter Prescriptions as of 06/21/2010  Medication Sig Dispense Refill  . aspirin 81 MG tablet Take 81  mg by mouth daily.        Marland Kitchen doxazosin (CARDURA XL) 8 MG 24 hr tablet Take 8 mg by mouth at bedtime.        . Multiple Vitamin (MULTIVITAMIN) capsule Take 1 capsule by mouth daily.        Marland Kitchen DISCONTD: traMADol (ULTRAM) 50 MG tablet Take 50 mg by mouth as needed.    ==> given by Urology for premature ejac.      Allergies  Allergen Reactions  . Amlodipine Besylate  REACTION: causes nightmares  . Tamsulosin     REACTION: pt states intol to Flomax w/ dizziness    Review of Systems        See HPI - all other systems neg except as noted... The patient denies anorexia, fever, weight loss, weight gain, vision loss, decreased hearing, hoarseness, chest pain, syncope, dyspnea on exertion, peripheral edema, prolonged cough, headaches, hemoptysis, abdominal pain, melena, hematochezia, severe indigestion/heartburn, hematuria, incontinence, muscle weakness, suspicious skin lesions, transient blindness, difficulty walking, depression, unusual weight change, abnormal bleeding, enlarged lymph nodes, and angioedema.    Objective:   Physical Exam      WD, Overweight, 69 y/o BM in NAD... GENERAL:  Alert & oriented; pleasant & cooperative... HEENT:  [place]/AT, EOM-wnl, PERRLA, EACs-clear, TMs-wnl, NOSE-clear, THROAT-clear & wnl. NECK:  Supple w/ fairROM; no JVD; normal carotid impulses w/o bruits; no thyromegaly or nodules palpated; no lymphadenopathy. CHEST:  Clear to P & A; without wheezes/ rales/ or rhonchi. HEART:  Regular Rhythm; without murmurs/ rubs/ or gallops. ABDOMEN:  Soft & nontender; normal bowel sounds; no organomegaly or masses detected. Rectal exam is deferred... EXT: without deformities, mild arthritic changes; no varicose veins/ venous insuffic/ or edema. NEURO:  CN's intact; motor testing normal; sensory testing normal; gait normal & balance OK. DERM:  No lesions noted; no rash etc...   Assessment & Plan:   HBP>  He admits to not taking the Doxazosin regularly;  Asked to do so to decr  stroke risk 7 assess efficacy of this med...  CHOL>  Remains on diet alone w/ fair numbers, he refuses med Rx...  GI>  DrJacobs> s/p colonoscopy w/ removal of adenomaous polyp 5/12 & repeat suggwested 76yrs...  Prostate Cancer>  Managed by DrBorden for Urology> pt continues on active surveillance protocol...  Son has AS hemoglobin>  Pt wants test for sickle trait & we will order a hemoglobin electrophoresis at his request..Marland Kitchen

## 2010-06-26 ENCOUNTER — Encounter: Payer: Self-pay | Admitting: Pulmonary Disease

## 2010-06-28 ENCOUNTER — Telehealth: Payer: Self-pay | Admitting: Pulmonary Disease

## 2010-06-28 NOTE — Telephone Encounter (Signed)
Please advise Dr. Kriste Basque pt is requesting his sickle cell trait results. Pt states he has received his other lab results except for that one. Thanks  Carver Fila, CMA

## 2010-06-29 NOTE — Telephone Encounter (Signed)
Called and spoke with pt and he is aware that the lab did not draw this test.  Offered to have pt come back for this test but he stated that he will just wait till his next ov.

## 2010-06-30 NOTE — Assessment & Plan Note (Signed)
[facility]                             PULMONARY OFFICE NOTE   Castulo, Scarpelli ABDOU STOCKS                      MRN:          [id number]  DATE:02/19/2006                            DOB:          [birth year]    HISTORY OF PRESENT ILLNESS:  The patient is a 70 year old, African-  American male patient of Dr. Jodelle Green who has a known history of prostate  cancer, irritable bowel syndrome, who presents for an acute office  visit.  The patient complains of a 3-day history of right shoulder and  neck pain.  The patient reports that he has been doing some heavy  lifting and woodwork recently.  And, three days ago, he was lifting a  heavy bag with several books in it and swung his arm backwards when he  felt a sharp pain along his right neck and shoulderblade.  The patient  had significant pain and tenderness that was increased with movement.  Pain has slightly improved today.  However, it is still quite tender to  touch.  The patient denies chest pain, shortness of breath, nausea,  vomiting, abdominal pain, or previous injury.  The patient does have a  known history of prostate cancer and was most recently followed at [facility] by Dr. Thayer Jew with, per report, a PSA that was stable at  11.5.  Prior to the last 3 days, the patient denies any joint or back  pain.  The patient has not used any medications for treatment.   PAST MEDICAL HISTORY:  Reviewed.   CURRENT MEDICATIONS:  Reviewed.   PHYSICAL EXAM:  The patient is a pleasant male in no acute distress.  He is afebrile with stable vital signs.  O2 saturation is 97% on room  air.  HEENT:  Unremarkable.  NECK:  Supple without adenopathy.  No JVD.  Lung sounds are clear to auscultation bilaterally.  CARDIAC:  Regular rate.  ABDOMEN:  Soft without any hepatosplenomegaly.  No guarding or rebound  noted.  No abdominal bruits or masses appreciated.  EXTREMITIES:  Warm without any calf tenderness, cyanosis,  clubbing, or  edema.  Upper extremity strength is normal.  Range of motion is normal  on the left.  Range of motion is normal on the right but with  reproducible symptoms along his right lateral neck.  The patient has  tenderness along the right lateral neck and down into the right  subscapular region.  No ecchymosis, deformity is noted.  Hand grips are  normal.  Pulses are intact.  SKIN:  Without rash.   IMPRESSION AND PLAN:  Right neck and shoulder strain.  The patient is to  alternate ice and heat to the area.  The patient is to begin Motrin 800  mg t.i.d. with food x7 days.  May use Skelaxin 800 mg up to 3 times a  day as needed, along with Vicodin 1-2 every 4-6 hours as needed for  severe pain.  The patient is aware of sedating effect.  Symptoms should  improve within the next 2 weeks.  If symptoms do not resolve, the  patient  may need x-rays and/or  referral to orthopedist.  The patient is aware to follow back up if  symptoms do not resolve past 2 weeks.      Rubye Oaks, NP  Electronically Signed      Lonzo Cloud. Kriste Basque, MD  Electronically Signed   TP/MedQ  DD: 02/19/2006  DT: 02/19/2006  Job #: [id number]

## 2010-07-15 ENCOUNTER — Encounter: Payer: Self-pay | Admitting: Pulmonary Disease

## 2010-09-11 ENCOUNTER — Telehealth: Payer: Self-pay | Admitting: Pulmonary Disease

## 2010-09-11 NOTE — Telephone Encounter (Signed)
Per SN, needs to see urology- rec Dr Lynnae Sandhoff or Dr Vonita Moss. I spoke with pt and notified of these recs. He states already has urology appt for Friday and will keep this appt.

## 2010-09-11 NOTE — Telephone Encounter (Signed)
Called and spoke with pts wife---she stated that the pt had a stomach virus x 1 week ago--pt c/o pain in his left side and pain in his genital area.  The pain started all of a sudden.  Pt was resting when i called.  Pt has appt with urology on Friday.  She stated that he is able to urinate ok and no pain with urinating. Pt stated that his penis is painful as well.

## 2010-12-20 ENCOUNTER — Ambulatory Visit (INDEPENDENT_AMBULATORY_CARE_PROVIDER_SITE_OTHER): Payer: Medicare Other | Admitting: Pulmonary Disease

## 2010-12-20 ENCOUNTER — Encounter: Payer: Self-pay | Admitting: Pulmonary Disease

## 2010-12-20 DIAGNOSIS — C61 Malignant neoplasm of prostate: Secondary | ICD-10-CM

## 2010-12-20 DIAGNOSIS — I10 Essential (primary) hypertension: Secondary | ICD-10-CM

## 2010-12-20 DIAGNOSIS — D126 Benign neoplasm of colon, unspecified: Secondary | ICD-10-CM

## 2010-12-20 DIAGNOSIS — E7800 Pure hypercholesterolemia, unspecified: Secondary | ICD-10-CM

## 2010-12-20 DIAGNOSIS — Z23 Encounter for immunization: Secondary | ICD-10-CM

## 2010-12-20 DIAGNOSIS — N139 Obstructive and reflux uropathy, unspecified: Secondary | ICD-10-CM | POA: Insufficient documentation

## 2010-12-20 DIAGNOSIS — F411 Generalized anxiety disorder: Secondary | ICD-10-CM

## 2010-12-20 NOTE — Patient Instructions (Signed)
Today we updated your med list in our EPIC system...    We decided to decrease your CARDURA to 1/2 tab daily & see if your BP stays controlled & your voiding symptoms don't get worse; all at the same time that your mood swings are less & your memory better...  Call me w/ a progress report in a month or two...  Let's plan a routine follow up eval w/ fasting blood work in about 6 months.Marland KitchenMarland Kitchen

## 2010-12-31 ENCOUNTER — Other Ambulatory Visit: Payer: Self-pay | Admitting: Pulmonary Disease

## 2011-01-07 ENCOUNTER — Encounter: Payer: Self-pay | Admitting: Pulmonary Disease

## 2011-01-07 NOTE — Progress Notes (Signed)
Subjective:    Patient ID: John [name], male    DOB: Jul 05, 1940, 70 y.o.   MRN: [id number]  HPI 70 y/o BM here for a follow up visit... he has multiple medical problems as noted below...   ~  he was prev followed Q51mo by the Urology division at [facility] for prostate cancer- bx 1/03 was + for sm volume prostate cancer, subseq bx 12/04 was neg, on active surveillance w/ PSA's in the 10-12 range and stable... last seen 9/09 & their note is reviewed... he's had some nocturia and freq- INTOL to Flomax & they have rec RAPAFLO but he hasn't tried it yet...  ~  April 28, 2009:  he saw DrBorden 10/10 w/ PSA=13, he wanted to repeat bx but pt declined, f/u eval sched for 4/11... BP has been borderline elevated but pt declined med Rx... we discussed further eval w/ 2DEcho to help him decide if med Rx indicated or not (he never went for the Echo)... he denies symptoms- no CP, palpit, SOB, etc... he is also due for f/u colonoscopy by DrJacobs (colon 3/08 w/ 10mm adenomatous polyp removed- f/u colon sched for pt & he cancelled the appt).  ~  December 13, 2009:  he had f/u DrBorden (PSA~18) w/ repeat prostate bx= "no cancer there" per pt hx (we don't have results from Urology) & he notes f/u due soon... he notes a lipoma on left lat foot- reassured & offered surg referral if he wants... BP noted to be elev & we discussed adding DOXAZOSIN 8mg /d...  ~  Jun 21, 2010:  29mo ROV & his only med is ASA 81mg  & Doxazosin 8mg /d> problem is he's not taking it every day & BP today= 160/90 & I told him he is increasing his stroke risk by not taking it every day! He points out that he remains asymptomatic w/o CP/ palpit/ dizzy/ SOB/ edmea/ or cerebral ischemic symptoms... He had 2DEcho 11/11 showing norm LV size & func w/ EF=55-60% & no regional wall motion problems, mild LA dil, otherw normal study...  He saw DrBorden 1/12 (he had surveillence bx 6/11 when PSA rose to 16= low vol Gleason 6 tumor & they elected to continue watchful  waiting) & his f/u PSA was 15, continuing observation + added Finasteride for BPH symptoms but he didn't stick w/ it...  He also had f/u colonoscopy 5/12 by DrJacobs w/ 2 sm polyps removed= 1 was a tubular adenoma, f/u rec 30yrs...  ~  December 20, 2010:  29mo ROV & wife states that the Doxazosin is causing mood swings & problems making decisions, forgetful> they are convinced it is the Cardura; he has nocturia x 2-3 but this is not particularly bothersome to the pt (I pointed out that sleep disruption could cause sleepiness, forgetfulness, mood swings but they are SURE it's the Cardura);  Offered to switch the med but they request just decr the dose to 1/2 tab daily- ok...  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          Problem List:  HYPERTENSION (ICD-401.9) - he started on DOXAZOSIN 8mg /d 11/11 for elev BP & prostate symptoms;  He was intol to Norvasc in the past c/o nightmares while on this med;  He prefers to control BP w/ herbs, diet, low sodium, etc... ~  3/11:  BP= 150/90 today & he denies HA, fatigue, visual changes, CP, palipit, dizziness, syncope, dyspnea, edema, etc... we discussed 2DEcho to eval for  LVH ==> finall done 11/11 & was WNL, no LVH etc... ~  11/11:  BP up at 150/100 today & we discussed diet, no salt, & start DOXAZOSIN 8mg /d. ~  5/12:  BP= 160/90 & he admits to not taking the Doxazosin regularly, asked to do so (so we can assess response)... ~  11/12:  BP= 148/80 & reminded to take the Doxazosin regularly but they want to decr the dose to 1/2 tab daily due to mood changes & forgetfulness that they are sure is coming from this med.  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 Remote hx of PVC's w/ eval 1986 showing rare PVC's on holter monitor;  no cardiac symptoms; EKG w/ L axis, poor R prog V1-3, NAD.  HYPERCHOLESTEROLEMIA, MILD (ICD-272.0) - hx mild elevation  of TChol & LDL in the past- he prefers to control w/ diet + exercise therapy and doesn't want statin med Rx...  ~  FLP 12/09 showed TChol 207, Tg 52, HDL 38, LDL 141... he prefers diet. ~  FLP 7/10 showed TChol 177, TG 50, HDL 40, LDL 127 ~  FLP 3/11 showed TChol 191, TG 64, HDL 47, LDL 131 ~  FLP 5/12 showed TChol 181, TG 40, HDL 47, LDL 126... He continues to refuse med rx.     Hx of IRRITABLE BOWEL SYNDROME (ICD-564.1)  ADENOMATOUS COLONIC POLYP (ICD-211.3) - Colonoscopy by [facility] 3/08 w/ 10mm polyp in asc colon, removed w/ path= tubular adenoma;  repeat colon due 70yrs. ~  7/10: he denies nausea, vomiting, heartburn, diarrhea, constipation, blood in stool, abdominal pain, swelling, gas... ~  5/12: pt finally had his f/u colonoscopy> 2 polyps- one tubular adenoma w/ f/u suggested 68yrs...  Hx of HEMORRHOIDS (ICD-455.6) - Hx hemorroid surg by DrGerkin 10/03     PROSTATE CANCER (ICD-185) - Dx w/ Prostate Ca 1/03 by DrPeterson & offered surg vs seeds;  second opinion w/ DrDalstedt 2/03 w/ long discussion re: options;  discussed seed Rx w/ DrGoodchild 3/03;  pt preferred herbal Rx- and takes [employer] & Immune Enhancers;  4th opinion w/ Dr Sinclair Grooms @ [facility] 8/04 w/ repeat bx which was neg & decision made for "watchful waiting" w/ q17mo follow up in [facility]- last seen 9/09 and PSA was 10.3.Marland Kitchen. ~  7/10: pt informs me that he is no longer going to [facility] and wants a Emergency planning/management officer... refer to DrBorden at [facility]... we did his PSA= 13.85... ~  3/11:  pt still on watchful waiting protocol per his decision... PSA today = 18.36... refer back to DrBorden> note indicates that he wants to repeat bx & pt agreed> we don't have results but pt indicates that "there was no cancer there". ~  1/12:  f/u note from DrBorden reviewed> continues on watchful waiting, tried Finasteride for BPH symptoms... ~  8/12:  F/u note from DrBorden reviewed> pt declined incr meds either incr dose of doxazosin or adding  Finasteride, continue watchful waiting...  Hx of GYNECOMASTIA (ICD-611.1) - Eval for L Gynecomastia 5/01 in [place]; FNA bx was neg... he has been taking an "immune enhancer" from DrNeilson in [place], [place] (pt asked to bring bottle in for [place] to review).     ANXIETY (ICD-300.00)  Question of HEMOGLOBINOPATHY> Soul tells me his son has sickle trait & his wife was apparently tested & has normal hemoglobin- so Unknown must have AS Hgb and we will order hemoglobin electrophoresis to check...  Health Maint:  pt still taking supplemental "Immune Enhancer" - he states "I can tell a difference, I have more energy".   Past Surgical History  Procedure Date  . Colonoscopy   . Polypectomy   . Prostate biopsy 02/2001    repeat in 11/2002 Dr. Sinclair Grooms at [facility]    Outpatient Encounter Prescriptions as of 12/20/2010  Medication Sig Dispense Refill  . aspirin 81 MG tablet Take 81 mg by mouth daily.        Marland Kitchen doxazosin (CARDURA XL) 8 MG 24 hr tablet TAKE 1/2 TABLET BY MOUTH AT BEDTIME      . Multiple Vitamin (MULTIVITAMIN) capsule Take 1 capsule by mouth daily.          Allergies  Allergen Reactions  . Amlodipine Besylate     REACTION: causes nightmares  . Tamsulosin     REACTION: pt states intol to Flomax w/ dizziness    Review of Systems        See HPI - all other systems neg except as noted... The patient denies anorexia, fever, weight loss, weight gain, vision loss, decreased hearing, hoarseness, chest pain, syncope, dyspnea on exertion, peripheral edema, prolonged cough, headaches, hemoptysis, abdominal pain, melena, hematochezia, severe indigestion/heartburn, hematuria, incontinence, muscle weakness, suspicious skin lesions, transient blindness, difficulty walking, depression, unusual weight change, abnormal bleeding, enlarged lymph nodes, and angioedema.    Objective:   Physical Exam      WD, Overweight, 69 y/o BM in NAD... GENERAL:  Alert & oriented; pleasant & cooperative... HEENT:   Johnson/AT, EOM-wnl, PERRLA, EACs-clear, TMs-wnl, NOSE-clear, THROAT-clear & wnl. NECK:  Supple w/ fairROM; no JVD; normal carotid impulses w/o bruits; no thyromegaly or nodules palpated; no lymphadenopathy. CHEST:  Clear to P & A; without wheezes/ rales/ or rhonchi. HEART:  Regular Rhythm; without murmurs/ rubs/ or gallops. ABDOMEN:  Soft & nontender; normal bowel sounds; no organomegaly or masses detected. Rectal exam is deferred... EXT: without deformities, mild arthritic changes; no varicose veins/ venous insuffic/ or edema. NEURO:  CN's intact; motor testing normal; sensory testing normal; gait normal & balance OK. DERM:  No lesions noted; no rash etc...   Assessment & Plan:   HBP>  He admits to not taking the Doxazosin regularly;  Asked to do so to decr stroke risk & assess efficacy of this med for his BP; he wants to take 1/2 tab daily due to perceived side effects noted above...  CHOL>  Remains on diet alone w/ fair numbers, he refuses med Rx...  GI>  DrJacobs> s/p colonoscopy w/ removal of adenomaous polyp 5/12 & repeat suggested 48yrs...  Prostate Cancer>  Managed by DrBorden for Urology> pt continues on active surveillance protocol...  Son has AS hemoglobin>  Pt wants test for sickle trait & we will order a hemoglobin electrophoresis at his request (testwasn't carried out by the lab).Marland KitchenMarland Kitchen

## 2011-03-16 DIAGNOSIS — N400 Enlarged prostate without lower urinary tract symptoms: Secondary | ICD-10-CM | POA: Diagnosis not present

## 2011-03-16 DIAGNOSIS — C61 Malignant neoplasm of prostate: Secondary | ICD-10-CM | POA: Diagnosis not present

## 2011-04-20 ENCOUNTER — Telehealth: Payer: Self-pay | Admitting: Pulmonary Disease

## 2011-04-20 ENCOUNTER — Ambulatory Visit (INDEPENDENT_AMBULATORY_CARE_PROVIDER_SITE_OTHER): Payer: Medicare Other

## 2011-04-20 DIAGNOSIS — Z23 Encounter for immunization: Secondary | ICD-10-CM | POA: Diagnosis not present

## 2011-04-20 NOTE — Telephone Encounter (Signed)
Called and spoke with pt and he is going to come in at 2pm today for his TB skin test.  Pt is aware that he will need to come back on Monday so we can read the results and place the results on his form,  Pt voiced his understanding of this.

## 2011-04-20 NOTE — Telephone Encounter (Signed)
Per SN---ok to come in today for the TB skin test and he will need to come back Monday to have this read.  He can drop the papers off today when he comes in.  thanks

## 2011-04-20 NOTE — Telephone Encounter (Signed)
Patient needs to have a TB test placed today,  and Health Certificate (will bring form in today) filled out with results of TB test by Tuesday of next week. 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

## 2011-04-24 LAB — TB SKIN TEST: TB Skin Test: NEGATIVE mm

## 2011-06-21 ENCOUNTER — Ambulatory Visit: Payer: Medicare Other | Admitting: Pulmonary Disease

## 2011-07-23 ENCOUNTER — Telehealth: Payer: Self-pay | Admitting: Pulmonary Disease

## 2011-07-23 MED ORDER — AZITHROMYCIN 250 MG PO TABS: ORAL_TABLET | ORAL | Status: DC

## 2011-07-23 NOTE — Telephone Encounter (Signed)
Per SN---ok to send in zpak #1  Take as directed with no refills.  Use otc mucinex 2 po bid with plenty of fluids and otc delsym 2 tsp bid for cough.  thanks

## 2011-07-23 NOTE — Telephone Encounter (Signed)
i spoke with spouse and is aware of SN recs. Rx has been sent to the pharmacy

## 2011-07-23 NOTE — Telephone Encounter (Signed)
I spoke with spouse and pt c/o dry hacking cough, head stopped up, chest congestion, night sweats due to low grade fever x Saturday. Pt has been taking tussin cough syrup and an allergy medication w/ antihistamine. Spouse requesting to have something called in for pt. Please advise SN thanks  Allergies  Allergen Reactions  . Amlodipine Besylate     REACTION: causes nightmares  . Tamsulosin     REACTION: pt states intol to Flomax w/ dizziness

## 2011-08-03 ENCOUNTER — Ambulatory Visit: Payer: Medicare Other | Admitting: Pulmonary Disease

## 2011-08-06 DIAGNOSIS — H409 Unspecified glaucoma: Secondary | ICD-10-CM | POA: Diagnosis not present

## 2011-08-06 DIAGNOSIS — H4011X0 Primary open-angle glaucoma, stage unspecified: Secondary | ICD-10-CM | POA: Diagnosis not present

## 2011-08-28 ENCOUNTER — Ambulatory Visit (INDEPENDENT_AMBULATORY_CARE_PROVIDER_SITE_OTHER): Payer: Medicare Other | Admitting: Pulmonary Disease

## 2011-08-28 ENCOUNTER — Other Ambulatory Visit (INDEPENDENT_AMBULATORY_CARE_PROVIDER_SITE_OTHER): Payer: Medicare Other

## 2011-08-28 ENCOUNTER — Encounter: Payer: Self-pay | Admitting: Pulmonary Disease

## 2011-08-28 VITALS — BP 132/86 | HR 64 | Temp 97.0°F | Ht 75.0 in | Wt 227.6 lb

## 2011-08-28 DIAGNOSIS — D126 Benign neoplasm of colon, unspecified: Secondary | ICD-10-CM

## 2011-08-28 DIAGNOSIS — E7800 Pure hypercholesterolemia, unspecified: Secondary | ICD-10-CM | POA: Diagnosis not present

## 2011-08-28 DIAGNOSIS — F411 Generalized anxiety disorder: Secondary | ICD-10-CM

## 2011-08-28 DIAGNOSIS — I4949 Other premature depolarization: Secondary | ICD-10-CM | POA: Diagnosis not present

## 2011-08-28 DIAGNOSIS — K589 Irritable bowel syndrome without diarrhea: Secondary | ICD-10-CM

## 2011-08-28 DIAGNOSIS — I10 Essential (primary) hypertension: Secondary | ICD-10-CM

## 2011-08-28 DIAGNOSIS — D573 Sickle-cell trait: Secondary | ICD-10-CM

## 2011-08-28 DIAGNOSIS — C61 Malignant neoplasm of prostate: Secondary | ICD-10-CM

## 2011-08-28 LAB — HEPATIC FUNCTION PANEL
ALT: 19 U/L (ref 0–53)
AST: 23 U/L (ref 0–37)
Albumin: 3.9 g/dL (ref 3.5–5.2)
Total Bilirubin: 0.6 mg/dL (ref 0.3–1.2)
Total Protein: 6.9 g/dL (ref 6.0–8.3)

## 2011-08-28 LAB — CBC WITH DIFFERENTIAL/PLATELET
Basophils Relative: 0.5 % (ref 0.0–3.0)
Eosinophils Relative: 1.1 % (ref 0.0–5.0)
Lymphocytes Relative: 41.8 % (ref 12.0–46.0)
Monocytes Absolute: 0.3 10*3/uL (ref 0.1–1.0)
Monocytes Relative: 6.1 % (ref 3.0–12.0)
Neutrophils Relative %: 50.5 % (ref 43.0–77.0)
Platelets: 118 10*3/uL — ABNORMAL LOW (ref 150.0–400.0)
RBC: 5.13 Mil/uL (ref 4.22–5.81)
WBC: 4.6 10*3/uL (ref 4.5–10.5)

## 2011-08-28 LAB — BASIC METABOLIC PANEL
BUN: 20 mg/dL (ref 6–23)
Chloride: 108 mEq/L (ref 96–112)
Creatinine, Ser: 1.4 mg/dL (ref 0.4–1.5)
Glucose, Bld: 103 mg/dL — ABNORMAL HIGH (ref 70–99)
Potassium: 3.9 mEq/L (ref 3.5–5.1)

## 2011-08-28 LAB — TSH: TSH: 1.16 u[IU]/mL (ref 0.35–5.50)

## 2011-08-28 LAB — LIPID PANEL
Cholesterol: 185 mg/dL (ref 0–200)
LDL Cholesterol: 128 mg/dL — ABNORMAL HIGH (ref 0–99)
Triglycerides: 47 mg/dL (ref 0.0–149.0)

## 2011-08-28 MED ORDER — FUROSEMIDE 20 MG PO TABS
20.0000 mg | ORAL_TABLET | Freq: Every day | ORAL | Status: DC | PRN
Start: 2011-08-28 — End: 2011-09-06

## 2011-08-28 NOTE — Patient Instructions (Addendum)
Today we updated your med list in our EPIC system...    Continue your current medications the same...  Today we did your follow up FASTING blood work...    We will call you w/ the results when avail...  For your intermittent swelling:    NO SALT in diet!!!    Elevate your legs when possible...    Wear support hose...    Use the new LASIX (Furosemide) 20mg - one tab in AM as needed for swelling that won't go down overnight...  Call for any questions.Marland KitchenMarland Kitchen

## 2011-08-28 NOTE — Progress Notes (Signed)
Subjective:    Patient ID: KAMILO OCH, male    DOB: 1940/05/24, 71 y.o.   MRN: [id number]  HPI 71 y/o BM here for a follow up visit... he has multiple medical problems as noted below...   ~  he was prev followed Q3824mo by the Urology division at [facility] for prostate cancer- bx 1/03 was + for sm volume prostate cancer, subseq bx 12/04 was neg, on active surveillance w/ PSA's in the 10-12 range and stable... last seen 9/09 & their note is reviewed... he's had some nocturia and freq- INTOL to Flomax & they have rec RAPAFLO but he hasn't tried it yet...  ~  April 28, 2009:  he saw DrBorden 10/10 w/ PSA=13, he wanted to repeat bx but pt declined, f/u eval sched for 4/11... BP has been borderline elevated but pt declined med Rx... we discussed further eval w/ 2DEcho to help him decide if med Rx indicated or not (he never went for the Echo)... he denies symptoms- no CP, palpit, SOB, etc... he is also due for f/u colonoscopy by DrJacobs (colon 3/08 w/ 10mm adenomatous polyp removed- f/u colon sched for pt & he cancelled the appt).  ~  December 13, 2009:  he had f/u DrBorden (PSA~18) w/ repeat prostate bx= "no cancer there" per pt hx (we don't have results from Urology) & he notes f/u due soon... he notes a lipoma on left lat foot- reassured & offered surg referral if he wants... BP noted to be elev & we discussed adding DOXAZOSIN 8mg /d...  ~  Jun 21, 2010:  824mo ROV & his only med is ASA 81mg  & Doxazosin 8mg /d> problem is he's not taking it every day & BP today= 160/90 & I told him he is increasing his stroke risk by not taking it every day! He points out that he remains asymptomatic w/o CP/ palpit/ dizzy/ SOB/ edmea/ or cerebral ischemic symptoms... He had 2DEcho 11/11 showing norm LV size & func w/ EF=55-60% & no regional wall motion problems, mild LA dil, otherw normal study...  He saw DrBorden 1/12 (he had surveillence bx 6/11 when PSA rose to 16= low vol Gleason 6 tumor & they elected to continue watchful  waiting) & his f/u PSA was 15, continuing observation + added Finasteride for BPH symptoms but he didn't stick w/ it...  He also had f/u colonoscopy 5/12 by DrJacobs w/ 2 sm polyps removed= 1 was a tubular adenoma, f/u rec 44yrs...  ~  December 20, 2010:  824mo ROV & wife states that the Doxazosin is causing mood swings & problems making decisions, forgetful> they are convinced it is the Cardura; he has nocturia x 2-3 but this is not particularly bothersome to the pt (I pointed out that sleep disruption could cause sleepiness, forgetfulness, mood swings but they are SURE it's the Cardura);  Offered to switch the med but they request just decr the dose to 1/2 tab daily- ok...  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   ~  August 28, 2011:  71mo ROV & Valerio's CC is swelling in his legs for the past 2 weeks; he has mild venous insuffic & has not been restricting sodium; we reviewed the need for NO SALT, elev legs, support hose, & start LASIX 20mg /d as needed for swelling...  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    HBP> on Cardura8mg - taking 1/2 tab daily; BP= 132/86 &  they won't incr the dose due to "mood swings"; hopefully low sodium plus wt reduction will help...    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    Chol> they refuse meds, on diet alone, last LDL=126 & FLP today showed TChol 185, TG 47, HDL 48, LDL 128- he is content w/ these numbers...    GI> IBS, Hx colon polyp> last colon 5/12 w/ 2 polyps removed, stable- no recent symptoms...    GU> known prostate cancer & BPH w/ LTOS followed by DrBorden on Cardura; last seen 2/13 7 note reviewed...    Anxiety> He has a generalized anxiety disorder, & exac by substitute teaching stress; he declines anxiolytic rx...    Prob AS hemoglobin> his son has sickle trait 7 apparently his wife was tested & normal, therefore  Ashwath must be AS as well & we tried to confirm this w/ Hemoglobin electrophoresis but the blood was never drawn for this test... We reviewed prob list, meds, xrays and labs> see below>> LABS 7/13:  FLP- at goals x LDL=128 on diet alone;  Chems- ok;  CBC- ok w/ Hg=13.8 MCV=80 Plat=118;  TSH=1.16         Problem List:  HYPERTENSION (ICD-401.9) - he started on DOXAZOSIN 8mg /d 11/11 for elev BP & prostate symptoms;  He was intol to Norvasc in the past c/o nightmares while on this med;  He prefers to control BP w/ herbs, diet, low sodium, etc... ~  3/11:  BP= 150/90 today & he denies HA, fatigue, visual changes, CP, palipit, dizziness, syncope, dyspnea, edema, etc... we discussed 2DEcho to eval for LVH ==> finall done 11/11 & was WNL, no LVH etc... ~  11/11:  BP up at 150/100 today & we discussed diet, no salt, & start DOXAZOSIN 8mg /d. ~  5/12:  BP= 160/90 & he admits to not taking the Doxazosin regularly, asked to do so (so we can assess response)... ~  11/12:  BP= 148/80 & reminded to take the Doxazosin regularly but they want to decr the dose to 1/2 tab daily due to mood changes & forgetfulness that they are sure is coming from this med. ~  7/13:  BP= 132/86 & he denies CP, palpit, dizzy, SOB, etc...  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 Remote hx of PVC's w/ eval 1986 showing rare PVC's on holter monitor;  no cardiac symptoms; EKG w/ L axis, poor R prog V1-3, NAD.  Venous Insuffic & Edema >> He noted some LE swelling & we discussed the need for low sodium diet, elevation, support hose, & start LASIX 20mg /d as needed...  HYPERCHOLESTEROLEMIA, MILD (ICD-272.0) - hx mild elevation of TChol & LDL in the past- he prefers to control w/ diet + exercise therapy and doesn't want statin med Rx...  ~  FLP 12/09 showed TChol 207, Tg 52, HDL 38, LDL 141... he prefers diet. ~  FLP 7/10 showed TChol 177, TG 50, HDL 40, LDL 127 ~  FLP  3/11 showed TChol 191, TG 64, HDL 47, LDL 131 ~  FLP 5/12 showed TChol 181, TG 40, HDL 47, LDL 126... He continues to refuse med rx. ~  FLP 7/13 showed TChol 185, TG 47, HDL 48, LDL 128     Hx of IRRITABLE BOWEL SYNDROME (ICD-564.1) - he denies abd pain, N/V, D/C, or blood seen...  ADENOMATOUS COLONIC POLYP (ICD-211.3)  ~  Colonoscopy by [facility] 3/08  w/ 10mm polyp in asc colon, removed w/ path= tubular adenoma;  repeat colon due 49yrs. ~  7/10: he denies nausea, vomiting, heartburn, diarrhea, constipation, blood in stool, abdominal pain, swelling, gas... ~  5/12: pt finally had his f/u colonoscopy> 2 polyps- one tubular adenoma w/ f/u suggested 37yrs...  Hx of HEMORRHOIDS (ICD-455.6) - Hx hemorroid surg by DrGerkin 10/03     PROSTATE CANCER (ICD-185) - Dx w/ Prostate Ca 1/03 by DrPeterson & offered surg vs seeds;  second opinion w/ DrDalstedt 2/03 w/ long discussion re: options;  discussed seed Rx w/ DrGoodchild 3/03;  pt preferred herbal Rx- and takes [employer] & Immune Enhancers;  4th opinion w/ Dr Sinclair Grooms @ [facility] 8/04 w/ repeat bx which was neg & decision made for "watchful waiting" w/ q29mo follow up in [facility]- last seen 9/09 and PSA was 10.3.Marland Kitchen. ~  7/10: pt informs me that he is no longer going to [facility] and wants a Emergency planning/management officer... refer to DrBorden at [facility]... we did his PSA= 13.85... ~  3/11:  pt still on watchful waiting protocol per his decision... PSA today = 18.36... refer back to DrBorden> note indicates that he wants to repeat bx & pt agreed> Bx done 6/11 & DrBorden's notes indicates it was pos for low vol Gleason6 prostate cancer & pt chose to continue surveillance; pt indicated "there was no cancer there". ~  1/12:  f/u note from DrBorden reviewed> continues on watchful waiting, tried Finasteride for BPH symptoms... ~  8/12:  f/u note from DrBorden reviewed> pt declined incr meds either incr dose of doxazosin or adding Finasteride, continue watchful waiting... ~   2/13:  f/u note from DrBorden reviewed> prostate ca, BPH/ LUTS, ED> PSA was 15.28 & he wanted to continue surveillance...  Hx of GYNECOMASTIA (ICD-611.1) - Eval for L Gynecomastia 5/01 in [place]; FNA bx was neg... he has been taking an "immune enhancer" from DrNeilson in [place], [place] (pt asked to bring bottle in for [place] to review).     ANXIETY (ICD-300.00) - Inez's underlying anxiety disorder is exac by substitute teaching now...  Question of HEMOGLOBINOPATHY> Shigeo tells me his son has sickle trait & his wife was apparently tested & has normal hemoglobin- so Kanin must have AS Hgb and we will order hemoglobin electrophoresis to check ==> this was ordered but never collected... ~  Labs 3/11 showed Hg= 14.0, MCV= 81, Plat= 147K ~  Labs 5/12 showed Hg= 14.6, MCV= 82, Plat= 131K ~  Labs 7/13 showed Hg= 13.8, MCV= 80, Plat= 118K  Health Maint:  pt still taking supplemental "Immune Enhancer" - he states "I can tell a difference, I have more energy".   Past Surgical History  Procedure Date  . Colonoscopy   . Polypectomy   . Prostate biopsy 02/2001    repeat in 11/2002 Dr. Sinclair Grooms at [facility]    Outpatient Encounter Prescriptions as of 08/28/2011  Medication Sig Dispense Refill  . aspirin 81 MG tablet Take 81 mg by mouth daily.        . bimatoprost (LUMIGAN) 0.03 % ophthalmic solution Place 1 drop into both eyes at bedtime.      Marland Kitchen doxazosin (CARDURA) 8 MG tablet       . ketotifen (ZADITOR) 0.025 % ophthalmic solution Place 1 drop into both eyes 2 (two) times daily as needed.      . Multiple Vitamin (MULTIVITAMIN) capsule Take 1 capsule by mouth daily.        Marland Kitchen DISCONTD:  doxazosin (CARDURA) 8 MG tablet TAKE 1 TABLET BY MOUTH EVERY DAY  30 tablet  8  . DISCONTD: azithromycin (ZITHROMAX) 250 MG tablet Take as directed  6 tablet  0  . DISCONTD: doxazosin (CARDURA XL) 8 MG 24 hr tablet TAKE 1/2 TABLET BY MOUTH AT BEDTIME        Allergies  Allergen Reactions  . Amlodipine Besylate     REACTION:  causes nightmares  . Tamsulosin     REACTION: pt states intol to Flomax w/ dizziness    Review of Systems        See HPI - all other systems neg except as noted... The patient denies anorexia, fever, weight loss, weight gain, vision loss, decreased hearing, hoarseness, chest pain, syncope, dyspnea on exertion, peripheral edema, prolonged cough, headaches, hemoptysis, abdominal pain, melena, hematochezia, severe indigestion/heartburn, hematuria, incontinence, muscle weakness, suspicious skin lesions, transient blindness, difficulty walking, depression, unusual weight change, abnormal bleeding, enlarged lymph nodes, and angioedema.    Objective:   Physical Exam      WD, Overweight, 70 y/o BM in NAD... GENERAL:  Alert & oriented; pleasant & cooperative... HEENT:  /AT, EOM-wnl, PERRLA, EACs-clear, TMs-wnl, NOSE-clear, THROAT-clear & wnl. NECK:  Supple w/ fairROM; no JVD; normal carotid impulses w/o bruits; no thyromegaly or nodules palpated; no lymphadenopathy. CHEST:  Clear to P & A; without wheezes/ rales/ or rhonchi. HEART:  Regular Rhythm; without murmurs/ rubs/ or gallops. ABDOMEN:  Soft & nontender; normal bowel sounds; no organomegaly or masses detected. Rectal exam is deferred... EXT: without deformities, mild arthritic changes; no varicose veins/ venous insuffic/ or edema. NEURO:  CN's intact; motor testing normal; sensory testing normal; gait normal & balance OK. DERM:  No lesions noted; no rash etc...  RADIOLOGY DATA:  Reviewed in the EPIC EMR & discussed w/ the patient...  LABORATORY DATA:  Reviewed in the EPIC EMR & discussed w/ the patient...   Assessment & Plan:   HBP>  He admits to not taking the Doxazosin regularly;  Asked to do so to decr stroke risk & assess efficacy of this med for his BP; he wants to take 1/2 tab daily due to perceived side effects noted above> OK but take 1/2 tab every day.  CHOL>  Remains on diet alone w/ fair numbers, he refuses med  Rx...  GI>  DrJacobs> s/p colonoscopy w/ removal of adenomaous polyp 5/12 & repeat suggested 39yrs...  Prostate Cancer>  Managed by DrBorden for Urology> pt continues on active surveillance protocol...  Son has AS hemoglobin>  Pt wants test for sickle trait & we will order a hemoglobin electrophoresis at his request (test wasn't carried out by the lab).Marland KitchenMarland Kitchen

## 2011-09-04 DIAGNOSIS — H4011X0 Primary open-angle glaucoma, stage unspecified: Secondary | ICD-10-CM | POA: Diagnosis not present

## 2011-09-04 DIAGNOSIS — H409 Unspecified glaucoma: Secondary | ICD-10-CM | POA: Diagnosis not present

## 2011-09-05 ENCOUNTER — Telehealth: Payer: Self-pay | Admitting: Pulmonary Disease

## 2011-09-05 ENCOUNTER — Encounter (HOSPITAL_COMMUNITY): Payer: Self-pay | Admitting: *Deleted

## 2011-09-05 ENCOUNTER — Emergency Department (HOSPITAL_COMMUNITY)
Admission: EM | Admit: 2011-09-05 | Discharge: 2011-09-05 | Disposition: A | Discharge status: Home / Self Care | Payer: Medicare Other | Attending: Emergency Medicine | Admitting: Emergency Medicine

## 2011-09-05 DIAGNOSIS — Z8546 Personal history of malignant neoplasm of prostate: Secondary | ICD-10-CM | POA: Insufficient documentation

## 2011-09-05 DIAGNOSIS — R197 Diarrhea, unspecified: Secondary | ICD-10-CM | POA: Insufficient documentation

## 2011-09-05 DIAGNOSIS — Z7982 Long term (current) use of aspirin: Secondary | ICD-10-CM | POA: Insufficient documentation

## 2011-09-05 DIAGNOSIS — Z87891 Personal history of nicotine dependence: Secondary | ICD-10-CM | POA: Diagnosis not present

## 2011-09-05 DIAGNOSIS — E7800 Pure hypercholesterolemia, unspecified: Secondary | ICD-10-CM | POA: Insufficient documentation

## 2011-09-05 DIAGNOSIS — N400 Enlarged prostate without lower urinary tract symptoms: Secondary | ICD-10-CM | POA: Diagnosis not present

## 2011-09-05 DIAGNOSIS — R509 Fever, unspecified: Secondary | ICD-10-CM | POA: Diagnosis not present

## 2011-09-05 DIAGNOSIS — F411 Generalized anxiety disorder: Secondary | ICD-10-CM | POA: Insufficient documentation

## 2011-09-05 DIAGNOSIS — E860 Dehydration: Secondary | ICD-10-CM | POA: Insufficient documentation

## 2011-09-05 DIAGNOSIS — K589 Irritable bowel syndrome without diarrhea: Secondary | ICD-10-CM | POA: Diagnosis not present

## 2011-09-05 DIAGNOSIS — I10 Essential (primary) hypertension: Secondary | ICD-10-CM | POA: Insufficient documentation

## 2011-09-05 DIAGNOSIS — R109 Unspecified abdominal pain: Secondary | ICD-10-CM | POA: Diagnosis not present

## 2011-09-05 LAB — COMPREHENSIVE METABOLIC PANEL
ALT: 14 U/L (ref 0–53)
Albumin: 3.4 g/dL — ABNORMAL LOW (ref 3.5–5.2)
Alkaline Phosphatase: 75 U/L (ref 39–117)
Glucose, Bld: 119 mg/dL — ABNORMAL HIGH (ref 70–99)
Potassium: 3.8 mEq/L (ref 3.5–5.1)
Sodium: 135 mEq/L (ref 135–145)
Total Protein: 6.8 g/dL (ref 6.0–8.3)

## 2011-09-05 LAB — CBC WITH DIFFERENTIAL/PLATELET
Basophils Relative: 0 % (ref 0–1)
Eosinophils Absolute: 0 10*3/uL (ref 0.0–0.7)
Lymphs Abs: 0.8 10*3/uL (ref 0.7–4.0)
MCH: 26.6 pg (ref 26.0–34.0)
Neutrophils Relative %: 88 % — ABNORMAL HIGH (ref 43–77)
RBC: 5.33 MIL/uL (ref 4.22–5.81)

## 2011-09-05 LAB — URINALYSIS, DIPSTICK ONLY
Protein, ur: 30 mg/dL — AB
Urobilinogen, UA: 0.2 mg/dL (ref 0.0–1.0)

## 2011-09-05 MED ORDER — SODIUM CHLORIDE 0.9 % IJ SOLN
3.0000 mL | INTRAMUSCULAR | Status: AC
  Administered 2011-09-05: 3 mL via INTRAVENOUS

## 2011-09-05 MED ORDER — SODIUM CHLORIDE 0.9 % IV BOLUS (SEPSIS)
1000.0000 mL | Freq: Once | INTRAVENOUS | Status: AC
  Administered 2011-09-05: 1000 mL via INTRAVENOUS

## 2011-09-05 MED ORDER — ACETAMINOPHEN 325 MG PO TABS
650.0000 mg | ORAL_TABLET | Freq: Once | ORAL | Status: AC
  Administered 2011-09-05: 650 mg via ORAL
  Filled 2011-09-05: qty 2

## 2011-09-05 NOTE — Telephone Encounter (Signed)
Continue tylenol for fever, rest and plenty of fluids if not completely better by next week will need to call back for ov, if worsens between now and Friday  call back for further recs

## 2011-09-05 NOTE — Telephone Encounter (Signed)
Spoke with pt's spouse. She states that the pt was taken to ED early this am for diarrhea and fever. He was given IV fluids and tylenol and sent home with instructions to f/u with PCP and stay hydrated and take tylenol for fever. She is requesting any further recs from SN. States that his BM's are now normal, but still has low grade temp. I advised that he can take tylenol for fever, but since no more diarrhea there is no need to take med for diarrhea. SN, she still wants recs from you, please advise thanks Allergies  Allergen Reactions  . Amlodipine Besylate     REACTION: causes nightmares  . Tamsulosin     REACTION: pt states intol to Flomax w/ dizziness

## 2011-09-05 NOTE — ED Notes (Signed)
PA at bedside.

## 2011-09-05 NOTE — ED Notes (Signed)
Pt c/o chills 1200 yesterday. Denies n/v. Diarrhea started yesterday and pt had 3-4 BMs over past 24 hours. Dark black loose stools reported. No abdominal pain, but reports gas.

## 2011-09-05 NOTE — ED Provider Notes (Signed)
History     CSN: [id number]  Arrival date & time 09/05/11  8295   First MD Initiated Contact with Patient 09/05/11 [phone]      Chief Complaint  Patient presents with  . Fever    (Consider location/radiation/quality/duration/timing/severity/associated sxs/prior treatment) HPI  71 year old presents complaining of fever. Patient reports for the past 2 days he has had fever, chills, night sweats and persistent diarrhea. Reports 5-7 bouts of diarrhea per day. Diarrhea is non-mucous, nonbloody. He also reports loss of appetite. Reports mild nausea without vomiting. Symptom has been persistent, but appears to be improving. Patient denies headache, chest pain, shortness of breath, abdominal pain, back pain. He does endorse increased urinary frequency without burning urination. Nothing seems to make the symptoms better or worse. He received Tylenol in the ED today at arrival. Patient reports having flu symptoms  over a month ago and did take Z-Pak for 5 days. Patient denies any recent travel, or any exotic food. Patient denies any recent sick contact.  Past Medical History  Diagnosis Date  . Cancer     prostate/healed with holistic meds  . Hypertension   . BPH (benign prostatic hyperplasia)   . Anxiety   . Premature ventricular contractions   . Hypercholesteremia   . IBS (irritable bowel syndrome)   . Hx of adenomatous colonic polyps   . Hemorrhoids   . Gynecomastia     Past Surgical History  Procedure Date  . Colonoscopy   . Polypectomy   . Prostate biopsy 02/2001    repeat in 11/2002 Dr. Sinclair Grooms at [facility]    No family history on file.  History  Substance Use Topics  . Smoking status: Former Smoker -- 0.5 packs/day for 15 years    Types: Cigarettes    Quit date: 02/12/1969  . Smokeless tobacco: Never Used  . Alcohol Use: No     quit in 1980      Review of Systems  All other systems reviewed and are negative.    Allergies  Amlodipine besylate and Tamsulosin  Home  Medications   Current Outpatient Rx  Name Route Sig Dispense Refill  . ASPIRIN 325 MG PO TABS Oral Take 325 mg by mouth daily.    Marland Kitchen BIMATOPROST 0.03 % OP SOLN Both Eyes Place 1 drop into both eyes at bedtime.    Marland Kitchen DOXAZOSIN MESYLATE 8 MG PO TABS Oral Take 8 mg by mouth at bedtime.     Marland Kitchen KETOTIFEN FUMARATE 0.025 % OP SOLN Both Eyes Place 1 drop into both eyes 2 (two) times daily as needed.    . FUROSEMIDE 20 MG PO TABS Oral Take 1 tablet (20 mg total) by mouth daily as needed. swelling 30 tablet 5    BP 147/74  Pulse 89  Temp 99 F (37.2 C) (Oral)  Resp 20  SpO2 96%  Physical Exam  Nursing note and vitals reviewed. Constitutional: He appears well-developed and well-nourished. No distress.       Awake, diaphoretic  HENT:  Head: Atraumatic.  Mouth/Throat: Oropharynx is clear and moist.  Eyes: Conjunctivae are normal. Right eye exhibits no discharge. Left eye exhibits no discharge.  Neck: Normal range of motion. Neck supple.  Cardiovascular: Normal rate and regular rhythm.   Pulmonary/Chest: Effort normal. No respiratory distress. He exhibits no tenderness.  Abdominal: Soft. Normal appearance. There is no tenderness. There is no rigidity, no rebound, no guarding, no CVA tenderness, no tenderness at McBurney's point and negative Murphy's sign. No hernia.  Genitourinary: Rectum normal. Rectal exam shows no external hemorrhoid, no internal hemorrhoid and anal tone normal. Guaiac negative stool.  Musculoskeletal: He exhibits no tenderness.       ROM appears intact, no obvious focal weakness  Neurological: He is alert.  Skin: Skin is warm and dry. No rash noted.  Psychiatric: He has a normal mood and affect.    ED Course  Procedures (including critical care time)   Labs Reviewed  CBC WITH DIFFERENTIAL  COMPREHENSIVE METABOLIC PANEL  URINALYSIS, DIPSTICK ONLY   No results found.   No diagnosis found.  Results for orders placed during the hospital encounter of 09/05/11  CBC  WITH DIFFERENTIAL      Component Value Range   WBC 13.0 (*) 4.0 - 10.5 K/uL   RBC 5.33  4.22 - 5.81 MIL/uL   Hemoglobin 14.2  13.0 - 17.0 g/dL   HCT 16.1  09.6 - 04.5 %   MCV 74.5 (*) 78.0 - 100.0 fL   MCH 26.6  26.0 - 34.0 pg   MCHC 35.8  30.0 - 36.0 g/dL   RDW 40.9  81.1 - 91.4 %   Platelets COUNT MAY BE INACCURATE DUE TO FIBRIN CLUMPS.  150 - 400 K/uL   Neutrophils Relative 88 (*) 43 - 77 %   Neutro Abs 11.5 (*) 1.7 - 7.7 K/uL   Lymphocytes Relative 6 (*) 12 - 46 %   Lymphs Abs 0.8  0.7 - 4.0 K/uL   Monocytes Relative 5  3 - 12 %   Monocytes Absolute 0.7  0.1 - 1.0 K/uL   Eosinophils Relative 0  0 - 5 %   Eosinophils Absolute 0.0  0.0 - 0.7 K/uL   Basophils Relative 0  0 - 1 %   Basophils Absolute 0.0  0.0 - 0.1 K/uL  COMPREHENSIVE METABOLIC PANEL      Component Value Range   Sodium 135  135 - 145 mEq/L   Potassium 3.8  3.5 - 5.1 mEq/L   Chloride 101  96 - 112 mEq/L   CO2 24  19 - 32 mEq/L   Glucose, Bld 119 (*) 70 - 99 mg/dL   BUN 22  6 - 23 mg/dL   Creatinine, Ser 7.82 (*) 0.50 - 1.35 mg/dL   Calcium 9.0  8.4 - 95.6 mg/dL   Total Protein 6.8  6.0 - 8.3 g/dL   Albumin 3.4 (*) 3.5 - 5.2 g/dL   AST 25  0 - 37 U/L   ALT 14  0 - 53 U/L   Alkaline Phosphatase 75  39 - 117 U/L   Total Bilirubin 1.1  0.3 - 1.2 mg/dL   GFR calc non Af Amer 36 (*) >90 mL/min   GFR calc Af Amer 41 (*) >90 mL/min  URINALYSIS, DIPSTICK ONLY      Component Value Range   Specific Gravity, Urine 1.029  1.005 - 1.030   pH 5.5  5.0 - 8.0   Glucose, UA NEGATIVE  NEGATIVE mg/dL   Hgb urine dipstick SMALL (*) NEGATIVE   Bilirubin Urine SMALL (*) NEGATIVE   Ketones, ur 15 (*) NEGATIVE mg/dL   Protein, ur 30 (*) NEGATIVE mg/dL   Urobilinogen, UA 0.2  0.0 - 1.0 mg/dL   Nitrite NEGATIVE  NEGATIVE   Leukocytes, UA LARGE (*) NEGATIVE   No results found.  1. Diarrhea 2. dehydration   MDM  Fever, chills, and diarrhea.  Last abx 3-4 weeks ago.  Abd nontender.  Plan to check basic labs,  c-diff by  PCR, stool culture. Hemoccult negative.   My attending has seen the pt and agrees that abd CT not warranted at this time. Will continue IVF hydration  BP 135/69  Pulse 88  Temp 99 F (37.2 C) (Oral)  Resp 20  SpO2 100%   11:11 AM  Pt subjectively feels better.  Able to tolerates PO.  No BM while in ED.      Fayrene Helper, PA-C 09/05/11 1120

## 2011-09-05 NOTE — ED Notes (Signed)
Pt c/o diarrhea x 2 days; no appetite; nausea; fever

## 2011-09-05 NOTE — Telephone Encounter (Signed)
Phone call to patient-spoke with pt's spouse in regards to [facility] recs for pts condition. Per SN-- "Continue tylenol for fever, rest and plenty of fluids if not completely better by next week will need to call back for ov, if worsens between now and Friday call back for further recs". Pt expressed understanding and stated that if he didn't get any better that she would call before Friday or make him an appt for next week to be seen.

## 2011-09-05 NOTE — ED Provider Notes (Signed)
Medical screening examination/treatment/procedure(s) were conducted as a shared visit with non-physician practitioner(s) and myself.  I personally evaluated the patient during the encounter  Patient with 2 days of diarrhea and fevers.  Patient has had mild nausea but no vomiting.  No bloody stools.  No focal abdominal pain.  On my exam he is a soft and nontender abdomen.  He appears well.  We will check the patient's urinalysis in addition to his other laboratory studies.  A C. difficile has been sent because the patient was on antibiotics several weeks ago.  I do anticipate that the patient will likely be able to be discharged home with close followup with his primary care physician and good return precautions to include returning for persistent fevers, bloody stools or increasing abdominal pain.  Nat Christen, MD 09/05/11 [phone]

## 2011-09-06 ENCOUNTER — Ambulatory Visit (INDEPENDENT_AMBULATORY_CARE_PROVIDER_SITE_OTHER): Payer: Medicare Other | Admitting: Internal Medicine

## 2011-09-06 ENCOUNTER — Encounter: Payer: Self-pay | Admitting: Internal Medicine

## 2011-09-06 ENCOUNTER — Telehealth: Payer: Self-pay | Admitting: Pulmonary Disease

## 2011-09-06 ENCOUNTER — Emergency Department (HOSPITAL_COMMUNITY)
Admission: EM | Admit: 2011-09-06 | Discharge: 2011-09-07 | Disposition: A | Discharge status: Home / Self Care | Payer: Medicare Other | Attending: Emergency Medicine | Admitting: Emergency Medicine

## 2011-09-06 ENCOUNTER — Emergency Department (HOSPITAL_COMMUNITY): Payer: Medicare Other

## 2011-09-06 ENCOUNTER — Encounter (HOSPITAL_COMMUNITY): Payer: Self-pay | Admitting: *Deleted

## 2011-09-06 VITALS — BP 170/80 | HR 104 | Temp 100.4°F | Ht 72.0 in | Wt 225.0 lb

## 2011-09-06 DIAGNOSIS — Z87891 Personal history of nicotine dependence: Secondary | ICD-10-CM | POA: Diagnosis not present

## 2011-09-06 DIAGNOSIS — K589 Irritable bowel syndrome without diarrhea: Secondary | ICD-10-CM | POA: Diagnosis not present

## 2011-09-06 DIAGNOSIS — R197 Diarrhea, unspecified: Secondary | ICD-10-CM

## 2011-09-06 DIAGNOSIS — Z8546 Personal history of malignant neoplasm of prostate: Secondary | ICD-10-CM | POA: Diagnosis not present

## 2011-09-06 DIAGNOSIS — I10 Essential (primary) hypertension: Secondary | ICD-10-CM | POA: Diagnosis not present

## 2011-09-06 DIAGNOSIS — R509 Fever, unspecified: Secondary | ICD-10-CM

## 2011-09-06 DIAGNOSIS — Z7982 Long term (current) use of aspirin: Secondary | ICD-10-CM | POA: Insufficient documentation

## 2011-09-06 DIAGNOSIS — N400 Enlarged prostate without lower urinary tract symptoms: Secondary | ICD-10-CM | POA: Diagnosis not present

## 2011-09-06 DIAGNOSIS — F411 Generalized anxiety disorder: Secondary | ICD-10-CM | POA: Insufficient documentation

## 2011-09-06 DIAGNOSIS — R109 Unspecified abdominal pain: Secondary | ICD-10-CM | POA: Insufficient documentation

## 2011-09-06 LAB — CBC WITH DIFFERENTIAL/PLATELET
Basophils Absolute: 0 10*3/uL (ref 0.0–0.1)
Eosinophils Absolute: 0 10*3/uL (ref 0.0–0.7)
Eosinophils Relative: 0 % (ref 0–5)
MCH: 26.9 pg (ref 26.0–34.0)
Monocytes Absolute: 0.4 10*3/uL (ref 0.1–1.0)
Neutrophils Relative %: 91 % — ABNORMAL HIGH (ref 43–77)
Platelets: DECREASED 10*3/uL (ref 150–400)
RBC: 5.24 MIL/uL (ref 4.22–5.81)
RDW: 14.9 % (ref 11.5–15.5)
Smear Review: DECREASED

## 2011-09-06 LAB — URINE MICROSCOPIC-ADD ON

## 2011-09-06 LAB — LIPASE, BLOOD: Lipase: 30 U/L (ref 11–59)

## 2011-09-06 LAB — URINALYSIS, ROUTINE W REFLEX MICROSCOPIC
Bilirubin Urine: NEGATIVE
Nitrite: POSITIVE — AB
Protein, ur: 100 mg/dL — AB
Specific Gravity, Urine: 1.035 — ABNORMAL HIGH (ref 1.005–1.030)
Urobilinogen, UA: 1 mg/dL (ref 0.0–1.0)

## 2011-09-06 LAB — COMPREHENSIVE METABOLIC PANEL
Alkaline Phosphatase: 77 U/L (ref 39–117)
BUN: 25 mg/dL — ABNORMAL HIGH (ref 6–23)
Calcium: 8.9 mg/dL (ref 8.4–10.5)
GFR calc Af Amer: 46 mL/min — ABNORMAL LOW (ref >90)
Glucose, Bld: 119 mg/dL — ABNORMAL HIGH (ref 70–99)
Total Protein: 7.1 g/dL (ref 6.0–8.3)

## 2011-09-06 MED ORDER — SODIUM CHLORIDE 0.9 % IV SOLN: 1000.0000 mL | INTRAVENOUS | Status: DC

## 2011-09-06 MED ORDER — LOPERAMIDE HCL 2 MG PO CAPS: 2.0000 mg | ORAL_CAPSULE | Freq: Four times a day (QID) | ORAL | Status: AC | PRN

## 2011-09-06 MED ORDER — METRONIDAZOLE 500 MG PO TABS: 500.0000 mg | ORAL_TABLET | Freq: Three times a day (TID) | ORAL | Status: AC

## 2011-09-06 MED ORDER — HYDROMORPHONE HCL PF 1 MG/ML IJ SOLN
1.0000 mg | Freq: Once | INTRAMUSCULAR | Status: DC
  Filled 2011-09-06: qty 1

## 2011-09-06 MED ORDER — METRONIDAZOLE IN NACL 5-0.79 MG/ML-% IV SOLN
500.0000 mg | Freq: Once | INTRAVENOUS | Status: AC
  Administered 2011-09-06: 500 mg via INTRAVENOUS
  Filled 2011-09-06: qty 100

## 2011-09-06 MED ORDER — IOHEXOL 300 MG/ML  SOLN
100.0000 mL | Freq: Once | INTRAMUSCULAR | Status: AC | PRN
  Administered 2011-09-06: 80 mL via INTRAVENOUS

## 2011-09-06 MED ORDER — ONDANSETRON HCL 4 MG/2ML IJ SOLN
4.0000 mg | Freq: Once | INTRAMUSCULAR | Status: DC
  Filled 2011-09-06: qty 2

## 2011-09-06 MED ORDER — LOPERAMIDE HCL 2 MG PO CAPS
2.0000 mg | ORAL_CAPSULE | Freq: Once | ORAL | Status: AC
  Administered 2011-09-06: 2 mg via ORAL
  Filled 2011-09-06: qty 1

## 2011-09-06 MED ORDER — SODIUM CHLORIDE 0.9 % IV SOLN
1000.0000 mL | Freq: Once | INTRAVENOUS | Status: AC
  Administered 2011-09-06: 1000 mL via INTRAVENOUS

## 2011-09-06 MED ORDER — ACETAMINOPHEN 325 MG PO TABS
650.0000 mg | ORAL_TABLET | Freq: Once | ORAL | Status: AC
  Administered 2011-09-06: 650 mg via ORAL
  Filled 2011-09-06: qty 2

## 2011-09-06 NOTE — Telephone Encounter (Signed)
Returning call can be reached [phone].John [name] ° °

## 2011-09-06 NOTE — ED Notes (Signed)
Pt presents with c/o abd pain, fever. Denies n/v. Wife sts pt was just seen at PCP and was told she had gotten the "wrong tylenol." Wife sts she was giving pt tylenol to treat diarrhea "that's what it said on the box." Reports 5-6 loose BMs today.

## 2011-09-06 NOTE — Patient Instructions (Addendum)
You will need to return to ER for evaluation of Abdominal pain or Fever

## 2011-09-06 NOTE — Telephone Encounter (Signed)
LMOMTCB x1 for pt 

## 2011-09-06 NOTE — ED Provider Notes (Signed)
History     CSN: [id number]  Arrival date & time 09/06/11  1620   First MD Initiated Contact with Patient 09/06/11 1738      Chief Complaint  Patient presents with  . Abdominal Pain  . Fever    Patient is a 71 y.o. male presenting with abdominal pain, fever, and diarrhea.  Abdominal Pain The primary symptoms of the illness include abdominal pain, fever and diarrhea.  Additional symptoms associated with the illness include chills and anorexia. Symptoms associated with the illness do not include constipation. Significant associated medical issues do not include diverticulitis.  Fever Primary symptoms of the febrile illness include fever, abdominal pain and diarrhea.  Diarrhea The primary symptoms include fever, abdominal pain and diarrhea. The illness began 2 days ago. The onset was gradual. The problem has been gradually worsening.  The diarrhea is watery. The diarrhea occurs 5 to 10 times per day. Risk factors: no foreign travel, no recent abx.  The illness is also significant for chills and anorexia. The illness does not include constipation. Associated medical issues do not include diverticulitis.  Pt was seen in the ED yesterday and released.  He is not feeling better today.  He went to his [place] pulmonary clinic and was referred back to the ED.  Past Medical History  Diagnosis Date  . Cancer     prostate/healed with holistic meds  . Hypertension   . BPH (benign prostatic hyperplasia)   . Anxiety   . Premature ventricular contractions   . Hypercholesteremia   . IBS (irritable bowel syndrome)   . Hx of adenomatous colonic polyps   . Hemorrhoids   . Gynecomastia     Past Surgical History  Procedure Date  . Colonoscopy   . Polypectomy   . Prostate biopsy 02/2001    repeat in 11/2002 Dr. Sinclair Grooms at [facility]    No family history on file.  History  Substance Use Topics  . Smoking status: Former Smoker -- 0.5 packs/day for 15 years    Types: Cigarettes    Quit date:  02/12/1969  . Smokeless tobacco: Never Used  . Alcohol Use: No     quit in 1980      Review of Systems  Constitutional: Positive for fever and chills.  Gastrointestinal: Positive for abdominal pain, diarrhea and anorexia. Negative for constipation.  All other systems reviewed and are negative.    Allergies  Amlodipine besylate and Tamsulosin  Home Medications   Current Outpatient Rx  Name Route Sig Dispense Refill  . ASPIRIN 325 MG PO TABS Oral Take 325 mg by mouth daily.    Marland Kitchen BIMATOPROST 0.01 % OP SOLN Both Eyes Place 1 drop into both eyes at bedtime.    Marland Kitchen DOXAZOSIN MESYLATE 8 MG PO TABS Oral Take 8 mg by mouth daily.     Marland Kitchen KETOTIFEN FUMARATE 0.025 % OP SOLN Both Eyes Place 1 drop into both eyes 2 (two) times daily as needed. For pain.    Ronney Asters COLD/FLU SEVERE DAY PO Oral Take 1-2 tablets by mouth daily as needed. For cold symptoms.      BP 168/95  Pulse 91  Temp 102.1 F (38.9 C) (Oral)  Resp 22  SpO2 97%  Physical Exam  Nursing note and vitals reviewed. Constitutional: He appears well-developed and well-nourished.       shivering  HENT:  Head: Normocephalic and atraumatic.  Right Ear: External ear normal.  Left Ear: External ear normal.  Eyes: Conjunctivae are normal. Right  eye exhibits no discharge. Left eye exhibits no discharge. No scleral icterus.  Neck: Neck supple. No tracheal deviation present.  Cardiovascular: Normal rate, regular rhythm and intact distal pulses.   Pulmonary/Chest: Effort normal and breath sounds normal. No stridor. No respiratory distress. He has no wheezes. He has no rales.  Abdominal: Soft. Bowel sounds are normal. He exhibits no distension. There is tenderness. There is no rebound and no guarding.       Mild lower abdomen  Musculoskeletal: He exhibits no edema and no tenderness.  Neurological: He is alert. He has normal strength. No sensory deficit. Cranial nerve deficit:  no gross defecits noted. He exhibits normal muscle tone.  He displays no seizure activity. Coordination normal.  Skin: Skin is warm and dry. No rash noted.  Psychiatric: He has a normal mood and affect.    ED Course  Procedures (including critical care time)  Labs Reviewed  COMPREHENSIVE METABOLIC PANEL - Abnormal; Notable for the following:    Glucose, Bld 119 (*)     BUN 25 (*)     Creatinine, Ser 1.69 (*)     Albumin 3.3 (*)     GFR calc non Af Amer 40 (*)     GFR calc Af Amer 46 (*)     All other components within normal limits  URINALYSIS, ROUTINE W REFLEX MICROSCOPIC - Abnormal; Notable for the following:    APPearance CLOUDY (*)     Specific Gravity, Urine 1.035 (*)     Hgb urine dipstick LARGE (*)     Protein, ur 100 (*)     Nitrite POSITIVE (*)     Leukocytes, UA MODERATE (*)     All other components within normal limits  CBC WITH DIFFERENTIAL - Abnormal; Notable for the following:    WBC 12.7 (*)     HCT 38.3 (*)     MCV 73.1 (*)     MCHC 36.8 (*)     Neutrophils Relative 91 (*)     Lymphocytes Relative 6 (*)     Neutro Abs 11.5 (*)     All other components within normal limits  URINE MICROSCOPIC-ADD ON - Abnormal; Notable for the following:    Bacteria, UA MANY (*)     Casts GRANULAR CAST (*)     All other components within normal limits  LIPASE, BLOOD  CLOSTRIDIUM DIFFICILE BY PCR   Ct Abdomen Pelvis W Contrast  09/06/2011  *RADIOLOGY REPORT*  Clinical Data: Abdominal pain and fever.  CT ABDOMEN AND PELVIS WITH CONTRAST  Technique:  Multidetector CT imaging of the abdomen and pelvis was performed following the standard protocol during bolus administration of intravenous contrast.  Contrast: 80mL OMNIPAQUE IOHEXOL 300 MG/ML  SOLN  Comparison: None.  Findings: There is mild atelectasis at the left lung base.  The liver, gallbladder, pancreas and adrenal glands are unremarkable. The spleen is normal in size and shows one calcified granuloma. Both kidneys show no evidence of obstruction or calculi.  There are simple cysts  bilaterally in both kidneys having a benign appearance.  The largest emanates from the lower pole of the right kidney and measures 6.6 cm in diameter.  Bowel loops show no evidence of obstruction or focal inflammation. The appendix is well visualized in the pelvis with top normal caliber of 7 mm.  There is no evidence of inflammation surrounding the appendix to suggest acute appendicitis.  No free fluid or abscess identified.  The bladder shows circumferential wall thickening and there  is evidence of significant prostatic enlargement.  Maximal diameter of the prostate gland is 7.8 cm.  No evidence of hernias.  No enlarged lymph nodes or focal masses are identified.  Bony structures are unremarkable.  IMPRESSION: No acute findings.  There is mild atelectasis at the left lung base.  Top normal caliber of the appendix without overt evidence of appendicitis by CT.  Significant prostatic enlargement.  Original Report Authenticated By: Reola Calkins, M.D.     1. Diarrhea   2. Fever       MDM  No signs of colitis or diverticulitis on the CT.  With his fever and persistent diarrhea will cover him empirically for C diff.  Pt is tolerating PO well.  Not in any distress.  Will dc home with close follow up.          Celene Kras, MD 09/06/11 [phone]

## 2011-09-06 NOTE — ED Notes (Signed)
Pt refused pain medication and nausea medication

## 2011-09-06 NOTE — ED Notes (Signed)
Patient reminded about need for stool specimen

## 2011-09-06 NOTE — ED Notes (Signed)
Patient reminded that urine is ordered and still needs to be collected. Patient aware that if urine is not collected, in and out cath may be order to obtain urine.  

## 2011-09-06 NOTE — Telephone Encounter (Signed)
OV with MW at 3:15 today

## 2011-09-06 NOTE — ED Notes (Signed)
Pt did not want dilaudid or zofran at this time

## 2011-09-06 NOTE — ED Notes (Signed)
Patient aware of need for urine specimen. Patient unable to void at this time. Patient given urinal. Encouraged to call for assistance if needed.   Patient aware of need for stool specimen. Patient unable to go at this time. Patient encouraged to call for assistance.

## 2011-09-06 NOTE — Assessment & Plan Note (Addendum)
Onset 09/05/11 - last abx 07/23/11 = Zpak - last travel = [place] mid June 2013 x one week  - last sick exp = great granddaughter around August 27 2011  Based on my interaction with him and his wife I don't think he's a candidate for any further outpt management - intially said she was giving him furosemide for his acute illness despite being warned re threatened dehydration then that said she hadn't actually given it to him and that he had a temp of 104 when it was 100.4,  Also "he just got out of the hospital" when in fact he was just seen in the er only and is not on any form of treatment at this point.  In short I was not sure I was getting an accurate hx and that recs might be hard to implement so the best option would be return to er and consider admit.

## 2011-09-06 NOTE — Progress Notes (Signed)
Spoke with pt's spouse. She states that the pt was taken to ED early this am for diarrhea and fever. He was given IV fluids and tylenol and sent home with instructions to f/u with PCP and stay hydrated and take tylenol for fever. She is requesting any further recs from SN. States that his BM's are now normal, but still has low grade temp. I advised that he can take tylenol for fever, but since no more diarrhea there is no need to take med for diarrhea. SN, she still wants recs from you, please advise thanks Allergies  Allergen Reactions  . Amlodipine Besylate     REACTION: causes nightmares  . Tamsulosin     REACTION: pt states intol to Flomax w/ dizziness    Subjective:   Patient ID: John [name], male    DOB: 1940/07/28   MRN: [id number]  HPI 71 y/o BM  09/06/2011 acute ov/ Leonel Mccollum w/in ov c/o acute chills and fever sweats and mild gen aches and no appetite 7/23 then diarrhea with gen crampy abd pain and went to ER 7/24 with wbc 13 k and mild dehyrdration rx fluids only and bad chills  Am 7/25 and multiple bms so returned to office. No vomiting, still ambulating ok.  ? Has anything been called in or given to him  for this problem  Answer by wife "yes, here it is" =  Bottle of furosemide.  Last  travel to [place]   and stayed at [place] mid June,  last abx zpak 6/10 for "cold" which completely resolved,  Last sick contact=  great grand daughter 1 week ago. No other unusual exposure hx   No sob, cough, cp, dysuria. abd pain is crampy and generalized, stools are watery.  ROS  The following are not active complaints unless bolded sore throat, dysphagia, dental problems, itching, sneezing,  nasal congestion or excess/ purulent secretions, ear ache,   fever, chills, sweats, unintended wt loss, pleuritic or exertional cp, hemoptysis,  orthopnea pnd or leg swelling, presyncope, palpitations, heartburn, abdominal pain, anorexia, nausea, vomiting, diarrhea  or change in bowel or urinary habits, change  in stools or urine, dysuria,hematuria,  rash, arthralgias, visual complaints, headache, numbness weakness or ataxia or problems with walking or coordination,  change in mood/affect or memory.      Objective:   Physical Exam  Uncomfortable but not toxic amb bm  GENERAL:  Alert & oriented; pleasant & cooperative... HEENT:  [place]/AT, EOM-wnl, PERRLA, EACs-clear, TMs-wnl, NOSE-clear, THROAT-clear & wnl. NECK:  Supple w/ fairROM; no JVD; normal carotid impulses w/o bruits; no thyromegaly or nodules palpated; no lymphadenopathy. CHEST:  Clear to P & A; without wheezes/ rales/ or rhonchi. HEART:  Regular Rhythm; without murmurs/ rubs/ or gallops. ABDOMEN:  Soft & no focal tenderness, no rebound EXT: without deformities, mild arthritic changes; no varicose veins/ venous insuffic/ or edema. NEURO:  CN's intact; motor testing normal; sensory testing normal; gait normal & balance OK. DERM:  No lesions noted; no rash        Assessment & Plan:

## 2011-09-07 LAB — CLOSTRIDIUM DIFFICILE BY PCR: Toxigenic C. Difficile by PCR: NEGATIVE

## 2011-09-07 NOTE — ED Notes (Signed)
Pt verbalizes understanding 

## 2011-09-08 NOTE — ED Provider Notes (Signed)
Medical screening examination/treatment/procedure(s) were conducted as a shared visit with non-physician practitioner(s) and myself.  I personally evaluated the patient during the encounter   Gerica Koble A Ebba Goll, MD 09/08/11 0842 

## 2011-10-05 DIAGNOSIS — C61 Malignant neoplasm of prostate: Secondary | ICD-10-CM | POA: Diagnosis not present

## 2011-10-12 DIAGNOSIS — R82998 Other abnormal findings in urine: Secondary | ICD-10-CM | POA: Diagnosis not present

## 2011-10-12 DIAGNOSIS — N400 Enlarged prostate without lower urinary tract symptoms: Secondary | ICD-10-CM | POA: Diagnosis not present

## 2011-10-12 DIAGNOSIS — C61 Malignant neoplasm of prostate: Secondary | ICD-10-CM | POA: Diagnosis not present

## 2011-10-15 ENCOUNTER — Emergency Department (HOSPITAL_COMMUNITY)
Admission: EM | Admit: 2011-10-15 | Discharge: 2011-10-15 | Disposition: A | Discharge status: Home / Self Care | Payer: Medicare Other | Attending: Emergency Medicine | Admitting: Emergency Medicine

## 2011-10-15 ENCOUNTER — Emergency Department (HOSPITAL_COMMUNITY): Payer: Medicare Other

## 2011-10-15 ENCOUNTER — Encounter (HOSPITAL_COMMUNITY): Payer: Self-pay | Admitting: *Deleted

## 2011-10-15 DIAGNOSIS — N201 Calculus of ureter: Secondary | ICD-10-CM | POA: Insufficient documentation

## 2011-10-15 DIAGNOSIS — K5900 Constipation, unspecified: Secondary | ICD-10-CM | POA: Diagnosis not present

## 2011-10-15 DIAGNOSIS — Z8546 Personal history of malignant neoplasm of prostate: Secondary | ICD-10-CM | POA: Diagnosis not present

## 2011-10-15 DIAGNOSIS — R1084 Generalized abdominal pain: Secondary | ICD-10-CM | POA: Diagnosis not present

## 2011-10-15 DIAGNOSIS — R109 Unspecified abdominal pain: Secondary | ICD-10-CM | POA: Insufficient documentation

## 2011-10-15 LAB — COMPREHENSIVE METABOLIC PANEL
AST: 21 U/L (ref 0–37)
Albumin: 3.9 g/dL (ref 3.5–5.2)
Calcium: 9.6 mg/dL (ref 8.4–10.5)
Creatinine, Ser: 1.81 mg/dL — ABNORMAL HIGH (ref 0.50–1.35)
Total Protein: 7.3 g/dL (ref 6.0–8.3)

## 2011-10-15 LAB — CBC WITH DIFFERENTIAL/PLATELET
Basophils Absolute: 0 10*3/uL (ref 0.0–0.1)
Basophils Relative: 0 % (ref 0–1)
Eosinophils Relative: 0 % (ref 0–5)
HCT: 36.7 % — ABNORMAL LOW (ref 39.0–52.0)
MCHC: 35.7 g/dL (ref 30.0–36.0)
MCV: 74.7 fL — ABNORMAL LOW (ref 78.0–100.0)
Monocytes Absolute: 0.4 10*3/uL (ref 0.1–1.0)
Platelets: 156 10*3/uL (ref 150–400)
RDW: 15 % (ref 11.5–15.5)

## 2011-10-15 LAB — OCCULT BLOOD, POC DEVICE: Fecal Occult Bld: NEGATIVE

## 2011-10-15 MED ORDER — OXYCODONE-ACETAMINOPHEN 5-325 MG PO TABS: 1.0000 | ORAL_TABLET | Freq: Three times a day (TID) | ORAL | Status: AC | PRN

## 2011-10-15 MED ORDER — HYDROMORPHONE HCL PF 1 MG/ML IJ SOLN
1.0000 mg | Freq: Once | INTRAMUSCULAR | Status: AC
  Administered 2011-10-15: 1 mg via INTRAVENOUS
  Filled 2011-10-15: qty 1

## 2011-10-15 MED ORDER — ONDANSETRON HCL 4 MG/2ML IJ SOLN
INTRAMUSCULAR | Status: AC
  Administered 2011-10-15: 4 mg
  Filled 2011-10-15: qty 2

## 2011-10-15 MED ORDER — ONDANSETRON HCL 4 MG/2ML IJ SOLN
4.0000 mg | Freq: Once | INTRAMUSCULAR | Status: AC
  Administered 2011-10-15: 4 mg via INTRAVENOUS
  Filled 2011-10-15: qty 2

## 2011-10-15 MED ORDER — MORPHINE SULFATE 4 MG/ML IJ SOLN
4.0000 mg | Freq: Once | INTRAMUSCULAR | Status: AC
  Administered 2011-10-15: 4 mg via INTRAVENOUS
  Filled 2011-10-15: qty 1

## 2011-10-15 MED ORDER — SODIUM CHLORIDE 0.9 % IV BOLUS (SEPSIS)
500.0000 mL | Freq: Once | INTRAVENOUS | Status: AC
  Administered 2011-10-15: 500 mL via INTRAVENOUS

## 2011-10-15 NOTE — ED Notes (Signed)
Pt states that he has been constipated since this am at 0430. He began to have severe, gen abdominal pain at 0600. He rates the pain as 9/10, constant, cramp pain. He is passing gas. LBM 8/31 and WNL for pt, however, he generally has a movement daily. Took castor oil at 0930 and 2 dulcolax tabs.

## 2011-10-15 NOTE — ED Notes (Signed)
MD at bedside. Rectal exam performed

## 2011-10-15 NOTE — ED Notes (Signed)
Patient transported to CT 

## 2011-10-15 NOTE — ED Notes (Signed)
Called to bedside by wife who states that pt pain is back again at a 7/10. ED MD notified and orders given.

## 2011-10-15 NOTE — ED Notes (Signed)
Pt sipping on his contrast again after nausea subsided.

## 2011-10-15 NOTE — ED Notes (Signed)
Patient transported to X-ray 

## 2011-10-15 NOTE — ED Notes (Signed)
Pt just started drinking contrast medium.  Rates abdominal pain as a 6/10.

## 2011-10-15 NOTE — ED Notes (Signed)
Pt vomited contrast.  Dr Estell Harpin notified.  4mg  Zofran given per MD verbal order.

## 2011-10-15 NOTE — ED Provider Notes (Signed)
History     CSN: [id number]  Arrival date & time 10/15/11  1249   First MD Initiated Contact with Patient 10/15/11 1321      Chief Complaint  Patient presents with  . Abdominal Pain  . Constipation    (Consider location/radiation/quality/duration/timing/severity/associated sxs/prior treatment) Patient is a 71 y.o. male presenting with abdominal pain and constipation. The history is provided by the patient.  Abdominal Pain The primary symptoms of the illness include abdominal pain. The primary symptoms of the illness do not include shortness of breath, nausea, vomiting or diarrhea.  Additional symptoms associated with the illness include constipation. Symptoms associated with the illness do not include back pain.  Constipation  Associated symptoms include abdominal pain. Pertinent negatives include no diarrhea, no nausea, no vomiting, no chest pain, no headaches and no rash.  patient has diffuse crampy abdominal pain. No fevers. It is contstant. He has had constipation. Last bowel movement was 2 days ago. He normally goes around 3 times a day. He still passing some gas. No relief with Castor oil or Dulcolax. No vomiting. The pain began around 6 AM. No trauma. He had an episode of abdominal pain and July, but states it was not like this.  Past Medical History  Diagnosis Date  . Cancer     prostate/healed with holistic meds  . BPH (benign prostatic hyperplasia)   . Anxiety   . Premature ventricular contractions   . Hypercholesteremia   . Hx of adenomatous colonic polyps   . Hemorrhoids   . Gynecomastia   . Hypertension     Past Surgical History  Procedure Date  . Colonoscopy   . Polypectomy   . Prostate biopsy 02/2001    repeat in 11/2002 Dr. Sinclair Grooms at [facility]    History reviewed. No pertinent family history.  History  Substance Use Topics  . Smoking status: Former Smoker -- 0.5 packs/day for 15 years    Types: Cigarettes    Quit date: 02/12/1969  . Smokeless tobacco:  Never Used  . Alcohol Use: No     quit in 1980      Review of Systems  Constitutional: Negative for activity change and appetite change.  HENT: Negative for neck stiffness.   Eyes: Negative for pain.  Respiratory: Negative for chest tightness and shortness of breath.   Cardiovascular: Negative for chest pain and leg swelling.  Gastrointestinal: Positive for abdominal pain and constipation. Negative for nausea, vomiting and diarrhea.  Genitourinary: Negative for flank pain.  Musculoskeletal: Negative for back pain.  Skin: Negative for rash.  Neurological: Negative for weakness, numbness and headaches.  Psychiatric/Behavioral: Negative for behavioral problems.    Allergies  Amlodipine besylate and Tamsulosin  Home Medications   Current Outpatient Rx  Name Route Sig Dispense Refill  . ASPIRIN 325 MG PO TABS Oral Take 325 mg by mouth daily.    Marland Kitchen BIMATOPROST 0.01 % OP SOLN Both Eyes Place 1 drop into both eyes at bedtime.    Marland Kitchen BISACODYL 5 MG PO TBEC Oral Take 10 mg by mouth daily as needed. Constipation    . CASTOR OIL OIL Oral Take 30 mLs by mouth daily as needed. constipation    . DOXAZOSIN MESYLATE 8 MG PO TABS Oral Take 8 mg by mouth daily.     Marland Kitchen KETOTIFEN FUMARATE 0.025 % OP SOLN Both Eyes Place 1 drop into both eyes 2 (two) times daily as needed. For pain.    Marland Kitchen LATANOPROST 0.005 % OP SOLN Both Eyes Place  1 drop into both eyes at bedtime.    Ronney Asters COLD/FLU SEVERE DAY PO Oral Take 1-2 tablets by mouth daily as needed. For cold symptoms.      BP 172/77  Pulse 87  Temp 97.6 F (36.4 C) (Oral)  Resp 20  SpO2 96%  Physical Exam  Nursing note and vitals reviewed. Constitutional: He is oriented to person, place, and time. He appears well-developed and well-nourished.       Patient appears uncomfortable.  HENT:  Head: Normocephalic and atraumatic.  Neck: Normal range of motion. Neck supple.  Cardiovascular: Normal rate, regular rhythm and normal heart sounds.   No  murmur heard. Pulmonary/Chest: Effort normal and breath sounds normal.  Abdominal: Soft. Bowel sounds are normal. He exhibits no distension and no mass. There is tenderness. There is no rebound and no guarding.  Musculoskeletal: Normal range of motion. He exhibits no edema.  Neurological: He is alert and oriented to person, place, and time. No cranial nerve deficit.  Skin: Skin is warm and dry.  Psychiatric: He has a normal mood and affect.  tender on rectal exam. Enlarged prostate.   ED Course  Procedures (including critical care time)  Labs Reviewed  CBC WITH DIFFERENTIAL - Abnormal; Notable for the following:    HCT 36.7 (*)     MCV 74.7 (*)     Neutrophils Relative 88 (*)     Lymphocytes Relative 8 (*)     All other components within normal limits  COMPREHENSIVE METABOLIC PANEL - Abnormal; Notable for the following:    Glucose, Bld 160 (*)     Creatinine, Ser 1.81 (*)     GFR calc non Af Amer 37 (*)     GFR calc Af Amer 43 (*)     All other components within normal limits  LIPASE, BLOOD   Dg Abd Acute W/chest  10/15/2011  *RADIOLOGY REPORT*  Clinical Data: Abdominal pain.  Nausea and constipation.  ACUTE ABDOMEN SERIES (ABDOMEN 2 VIEW & CHEST 1 VIEW)  Comparison: None.  Findings: No free air.  No active cardiopulmonary disease.  Bowel gas pattern is nonobstructive.  No dilated loops of large or small bowel.  Scattered small bowel air fluid levels are nonspecific. Paucity of distal colonic gas noted.  No rectal gas identified. There is a prominent right-sided stool burden.  IMPRESSION: Nonobstructive bowel gas pattern.  Left colon decompressed.  No acute abnormality.   Original Report Authenticated By: Andreas Newport, M.D.      No diagnosis found.    MDM  Patient with diffuse abdominal pain and constipation. White count is not elevated. He is moderately tender particularly in the left lower quadrant. He was tender on rectal examination. Patient is pending a CT at this time.  There is a heavy right-sided stool burden. He may need an enema and CAT scan does not show a cause of the pain.        Juliet Rude. Rubin Payor, MD 10/15/11 1520

## 2011-10-16 ENCOUNTER — Telehealth: Payer: Self-pay | Admitting: Pulmonary Disease

## 2011-10-16 ENCOUNTER — Telehealth: Payer: Self-pay | Admitting: Gastroenterology

## 2011-10-16 DIAGNOSIS — K5900 Constipation, unspecified: Secondary | ICD-10-CM

## 2011-10-16 DIAGNOSIS — R630 Anorexia: Secondary | ICD-10-CM

## 2011-10-16 DIAGNOSIS — R109 Unspecified abdominal pain: Secondary | ICD-10-CM

## 2011-10-16 NOTE — Telephone Encounter (Signed)
Per SN---she will need to force fluids----he has an appt with Amy Easterwood in GI in the am at 10:30---if he is not able to increase his fluid intake then he will need to be taken back to the ER for further eval.  thanks

## 2011-10-16 NOTE — Telephone Encounter (Signed)
Dr Kriste Basque wants this patient seen asap for constipation, abd pain, loss of appetite  Pt has been added to Amy Esterwood's schedule for tomorrow 10/17/11 1030 am [place] to notify pt

## 2011-10-16 NOTE — Telephone Encounter (Signed)
Left message on machine to call back  

## 2011-10-16 NOTE — Telephone Encounter (Signed)
I spoke with Steward Drone and also she stated would SN okay for pt to use a fleet suppository?. Please advise thanks

## 2011-10-16 NOTE — Telephone Encounter (Signed)
Per SN---we will send a referral to GI for these symptoms----in the meantime the pt can try  1 bottle of citrate of magnesium followed by lots of water Then if nothing take dulcolax supp at bedtime and if nothing by the morning take dulcolax po tabs.  thanks

## 2011-10-16 NOTE — Telephone Encounter (Signed)
Spoke with pt's spouse. She states that the pt has not had BM in 3 days. 10/15/11 am took dulcolax and castor oil with no relief so went to ED. Was advised to f/u with SN and be referred to GI asap. She states that they noted gallstones on ct abd and sent him home with pain med (? Name). She states now pt has no appetite and continues to have constipation and abd pain. She is requesting ov with SN. Please advise, thanks! Allergies  Allergen Reactions  . Amlodipine Besylate     REACTION: causes nightmares  . Tamsulosin     REACTION: pt states intol to Flomax w/ dizziness

## 2011-10-16 NOTE — Telephone Encounter (Signed)
Pt wife is aware.Carron Curie, CMA

## 2011-10-16 NOTE — Telephone Encounter (Signed)
Called, spoke with pt's wife.  Informed her of below per SN.  She verbalized understanding of this and aware order placed for GI eval.  States pt has only eaten 1 slice of oatmeal bread and 1 cup of fatless brooth since Sunday.  Is drinking very little - approx 2-3 glasses of fluid/day.  Requesting further recs on this.  Pls advise.  Thank you.

## 2011-10-16 NOTE — Telephone Encounter (Signed)
Calling back about SN's recs.

## 2011-10-17 ENCOUNTER — Ambulatory Visit (INDEPENDENT_AMBULATORY_CARE_PROVIDER_SITE_OTHER): Payer: Medicare Other | Admitting: Pulmonary Disease

## 2011-10-17 ENCOUNTER — Ambulatory Visit: Payer: Medicare Other | Admitting: Physician Assistant

## 2011-10-17 ENCOUNTER — Encounter: Payer: Self-pay | Admitting: Pulmonary Disease

## 2011-10-17 VITALS — BP 160/80 | HR 74 | Temp 98.4°F | Ht 75.0 in | Wt 226.0 lb

## 2011-10-17 DIAGNOSIS — N390 Urinary tract infection, site not specified: Secondary | ICD-10-CM | POA: Diagnosis not present

## 2011-10-17 DIAGNOSIS — N139 Obstructive and reflux uropathy, unspecified: Secondary | ICD-10-CM

## 2011-10-17 DIAGNOSIS — C61 Malignant neoplasm of prostate: Secondary | ICD-10-CM | POA: Diagnosis not present

## 2011-10-17 DIAGNOSIS — K5900 Constipation, unspecified: Secondary | ICD-10-CM | POA: Insufficient documentation

## 2011-10-17 DIAGNOSIS — K589 Irritable bowel syndrome without diarrhea: Secondary | ICD-10-CM

## 2011-10-17 DIAGNOSIS — D573 Sickle-cell trait: Secondary | ICD-10-CM

## 2011-10-17 DIAGNOSIS — F411 Generalized anxiety disorder: Secondary | ICD-10-CM

## 2011-10-17 DIAGNOSIS — I10 Essential (primary) hypertension: Secondary | ICD-10-CM

## 2011-10-17 DIAGNOSIS — E7800 Pure hypercholesterolemia, unspecified: Secondary | ICD-10-CM

## 2011-10-17 NOTE — Patient Instructions (Addendum)
Today we updated your med list in our EPIC system...    Continue your current medications the same...  For your Prostate & Obstructive Uropathy>    Continue the Sulfa antibiotic as he prescribed...    Follow up w/ DrBorden's team in their office tomorrow as directed...  For your constipation & increased stool burden>    Start taking MIRALAX OTC- one capful in water daily...    Take SENAKOT-S 1-2 tabs every night to establish a regular bowel movement pattern...  Call for any questions.Marland KitchenMarland Kitchen

## 2011-10-18 ENCOUNTER — Encounter: Payer: Self-pay | Admitting: Pulmonary Disease

## 2011-10-18 DIAGNOSIS — N419 Inflammatory disease of prostate, unspecified: Secondary | ICD-10-CM | POA: Diagnosis not present

## 2011-10-18 DIAGNOSIS — R82998 Other abnormal findings in urine: Secondary | ICD-10-CM | POA: Diagnosis not present

## 2011-10-18 DIAGNOSIS — R339 Retention of urine, unspecified: Secondary | ICD-10-CM | POA: Diagnosis not present

## 2011-10-18 NOTE — Progress Notes (Signed)
Subjective:    Patient ID: John [name], male    DOB: 1940-02-24, 71 y.o.   MRN: [id number]  HPI 71 y/o BM here for a follow up visit... he has multiple medical problems as noted below...   ~  he was prev followed Q49mo by the Urology division at [facility] for prostate cancer- bx 1/03 was + for sm volume prostate cancer, subseq bx 12/04 was neg, on active surveillance w/ PSA's in the 10-12 range and stable... last seen 9/09 & their note is reviewed... he's had some nocturia and freq- INTOL to Flomax & they have rec RAPAFLO but he hasn't tried it yet...  ~  April 28, 2009:  he saw DrBorden 10/10 w/ PSA=13, he wanted to repeat bx but pt declined, f/u eval sched for 4/11... BP has been borderline elevated but pt declined med Rx... we discussed further eval w/ 2DEcho to help him decide if med Rx indicated or not (he never went for the Echo)... he denies symptoms- no CP, palpit, SOB, etc... he is also due for f/u colonoscopy by DrJacobs (colon 3/08 w/ 10mm adenomatous polyp removed- f/u colon sched for pt & he cancelled the appt).  ~  December 13, 2009:  he had f/u DrBorden (PSA~18) w/ repeat prostate bx= "no cancer there" per pt hx (we don't have results from Urology) & he notes f/u due soon... he notes a lipoma on left lat foot- reassured & offered surg referral if he wants... BP noted to be elev & we discussed adding DOXAZOSIN 8mg /d...  ~  Jun 21, 2010:  51mo ROV & his only med is ASA 81mg  & Doxazosin 8mg /d> problem is he's not taking it every day & BP today= 160/90 & I told him he is increasing his stroke risk by not taking it every day! He points out that he remains asymptomatic w/o CP/ palpit/ dizzy/ SOB/ edmea/ or cerebral ischemic symptoms... He had 2DEcho 11/11 showing norm LV size & func w/ EF=55-60% & no regional wall motion problems, mild LA dil, otherw normal study...  He saw DrBorden 1/12 (he had surveillence bx 6/11 when PSA rose to 16= low vol Gleason 6 tumor & they elected to continue watchful  waiting) & his f/u PSA was 15, continuing observation + added Finasteride for BPH symptoms but he didn't stick w/ it...  He also had f/u colonoscopy 5/12 by DrJacobs w/ 2 sm polyps removed= 1 was a tubular adenoma, f/u rec 68yrs...  ~  December 20, 2010:  51mo ROV & wife states that the Doxazosin is causing mood swings & problems making decisions, forgetful> they are convinced it is the Cardura; he has nocturia x 2-3 but this is not particularly bothersome to the pt (I pointed out that sleep disruption could cause sleepiness, forgetfulness, mood swings but they are SURE it's the Cardura);  Offered to switch the med but they request just decr the dose to 1/2 tab daily- ok...  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   ~  August 28, 2011:  54mo ROV & Ernie's CC is swelling in his legs for the past 2 weeks; he has mild venous insuffic & has not been restricting sodium; we reviewed the need for NO SALT, elev legs, support hose, & start LASIX 20mg /d as needed for swelling...  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    HBP> on Cardura8mg - taking 1/2 tab daily; BP= 132/86 &  they won't incr the dose due to "mood swings"; hopefully low sodium plus wt reduction will help...    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    Chol> they refuse meds, on diet alone, last LDL=126 & FLP today showed TChol 185, TG 47, HDL 48, LDL 128- he is content w/ these numbers...    GI> IBS, Hx colon polyp> last colon 5/12 w/ 2 polyps removed, stable- no recent symptoms...    GU> known prostate cancer & BPH w/ LTOS followed by DrBorden on Cardura; last seen 2/13 7 note reviewed...    Anxiety> He has a generalized anxiety disorder, & exac by substitute teaching stress; he declines anxiolytic rx...    Prob AS hemoglobin> his son has sickle trait & apparently his wife was tested & normal, therefore  Jamorian must be AS as well & we tried to confirm this w/ Hemoglobin electrophoresis but the blood was never drawn for this test... We reviewed prob list, meds, xrays and labs> see below>> LABS 7/13:  FLP- at goals x LDL=128 on diet alone;  Chems- ok;  CBC- ok w/ Hg=13.8 MCV=80 Plat=118;  TSH=1.16  ~  October 17, 2011:  6wk ROV & add-on after 2 ER visits> He & wife are incredibly poor historians & has special difficulty focusing on salient issues; review of all avail data from [facility], ER visits, etc indicate 2 interval problems:  Obstructive uropathy & constipation>>    Obstructive Uropathy> NEW PROB, hx has known prostate cancer on observation per DrBorden & actually saw DrBorden 10/12/11 in office  (note reviewed), known low vol prostate ca but last Bx was 6/11, hx BPH/LUTS intol Tamsulosin & treated w/ Cardura 8mg /d; he had elev PSA & UTI (EColi- sens Cipro, Sulfa- treated w/ Bacterim), PVR=90cc... He had CT Abd 7/13 in ER for abd pain & fever (likely from UTI)==> incr bladder wall thickening, enlarged prostate, no adenopathy, mult incidental findings (left base Atx, calcif granuloma in spleen, bilat renal cysts, top-nl appendix at 7mm w/o inflamm)...  AFTER his Urology f/u w/ DrBorden 8/30 he returned to the ER 10/15/11 w/ abd pain & constipation (XRays have shown increased right colonic stool burden on mult exams); repeat CT Abd 9/2 showed marked increased prostate, distended bladder, dilated ureters bilat, hydronephrosis (plus 3 tiny calculi in bladder & renal cysts); NOTE> CREAT=1.8==> we called DrBorden's office & they will see him tomorrow for further eval & Rx...    Constipation> this is a recurrent prob that they have a hard time grasping the need for regular stool softener/ laxative meds; repeated XRays have shown an increased right sided stool burden; rec to take MIRALAX once or twice daily, and SENAKOT-S 1-2 at bedtime...    We reviewed prob list, meds, xrays and labs> see below for updates >> LABS  9/13:  CBC- Hg=13.1 WBC=8.7;  Chems- BS=160 BUN=17 Creat=1.8 (prev 1.4-1.7)         Problem List:  HYPERTENSION (ICD-401.9) - he started on DOXAZOSIN 8mg /d 11/11 for elev BP & prostate symptoms;  He was intol to Norvasc in the past c/o nightmares while on this med;  He prefers to control BP w/ herbs, diet, low sodium, etc... ~  3/11:  BP= 150/90 today & he denies HA, fatigue, visual changes, CP, palipit, dizziness, syncope, dyspnea, edema, etc... we discussed 2DEcho to eval for LVH ==> finall done 11/11 & was WNL, no LVH etc... ~  11/11:  BP up at 150/100  today & we discussed diet, no salt, & start DOXAZOSIN 8mg /d. ~  5/12:  BP= 160/90 & he admits to not taking the Doxazosin regularly, asked to do so (so we can assess response)... ~  11/12:  BP= 148/80 & reminded to take the Doxazosin regularly but they want to decr the dose to 1/2 tab daily due to mood changes & forgetfulness that they are sure is coming from this med. ~  7/13:  BP= 132/86 & he denies CP, palpit, dizzy, SOB, etc... ~  9/13:  BP= 160/90 w/ his obstructive uropathy & pain; on Cardura8mg /d- encouraged to take it regularly which he hasn't been doing...  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 Remote hx of PVC's w/ eval 1986 showing rare PVC's on holter monitor;  no cardiac symptoms; EKG w/ L axis, poor R prog V1-3, NAD.  Venous Insuffic & Edema >> He noted some LE swelling & we discussed the need for low sodium diet, elevation, support hose, & on LASIX 20mg /d as needed...  HYPERCHOLESTEROLEMIA, MILD (ICD-272.0) - hx mild elevation of TChol & LDL in the past- he prefers to control w/ diet + exercise therapy and doesn't want statin med Rx...  ~  FLP 12/09 showed TChol 207, Tg 52, HDL 38, LDL 141... he prefers diet. ~  FLP 7/10 showed TChol 177, TG 50, HDL 40, LDL 127 ~  FLP 3/11 showed TChol 191, TG 64, HDL 47, LDL 131 ~  FLP 5/12 showed TChol 181, TG 40, HDL  47, LDL 126... He continues to refuse med rx. ~  FLP 7/13 showed TChol 185, TG 47, HDL 48, LDL 128     Hx of IRRITABLE BOWEL SYNDROME (ICD-564.1) - he denies abd pain, N/V, D/C, or blood seen... CONSTIPATION >> several abd films have shown increased right colon stool burden & he is encouraged to take MIRALAX & SENAKOT-S regularly...  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 ~  7/10: he denies nausea, vomiting, heartburn, diarrhea, constipation, blood in stool, abdominal pain, swelling, gas... ~  5/12: pt finally had his f/u colonoscopy> 2 polyps- one tubular adenoma w/ f/u suggested 10yrs...  Hx of HEMORRHOIDS (ICD-455.6) - Hx hemorroid surg by DrGerkin 10/03     PROSTATE CANCER (ICD-185) - Dx w/ Prostate Ca 1/03 by DrPeterson & offered surg vs seeds;  second opinion w/ DrDalstedt 2/03 w/ long discussion re: options;  discussed seed Rx w/ DrGoodchild 3/03;  pt preferred herbal Rx- and takes [employer] & Immune Enhancers;  4th opinion w/ Dr Sinclair Grooms @ [facility] 8/04 w/ repeat bx which was neg & decision made for "watchful waiting" w/ q50mo follow up in [facility]- last seen 9/09 and PSA was 10.3.Marland Kitchen. ~  7/10: pt informs me that he is no longer going to [employer] and wants a Emergency planning/management officer... refer to DrBorden at [facility]... we did his PSA= 13.85... ~  3/11:  pt still on watchful waiting protocol per his decision... PSA today = 18.36... refer back to DrBorden> note indicates that he wants to repeat bx & pt agreed> Bx done 6/11 & DrBorden's notes indicates it was pos for low vol Gleason6 prostate cancer & pt chose to continue surveillance; pt indicated "there was no cancer there". ~  1/12:  f/u note from DrBorden reviewed> continues on watchful waiting, tried Finasteride for BPH symptoms... ~  8/12:  f/u note from DrBorden reviewed> pt declined incr meds either incr dose of doxazosin or  adding Finasteride, continue watchful waiting... ~  2/13:  f/u note from DrBorden reviewed> prostate ca, BPH/ LUTS, ED> PSA was 15.28 & pt wanted to continue surveillance... ~  7-9/13: 2 ER visits & f/u DrBorden> UTI w/ EColi & elev PSA- treated w/ Sulfa, developed obstructive uropathy as noted above==> f/u pending  Hx of GYNECOMASTIA (ICD-611.1) - Eval for L Gynecomastia 5/01 in [place]; FNA bx was neg... he has been taking an "immune enhancer" from DrNeilson in [place], [place] (pt asked to bring bottle in for [place] to review).     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  Question of HEMOGLOBINOPATHY> Belen tells me his son has sickle trait & his wife was apparently tested & has normal hemoglobin- so Mohamud must have AS Hgb and we will order hemoglobin electrophoresis to check ==> this was ordered but never collected... ~  Labs 3/11 showed Hg= 14.0, MCV= 81, Plat= 147K ~  Labs 5/12 showed Hg= 14.6, MCV= 82, Plat= 131K ~  Labs 7/13 showed Hg= 13.8, MCV= 80, Plat= 118K  Health Maint:  pt still taking supplemental "Immune Enhancer" - he states "I can tell a difference, I have more energy".   Past Surgical History  Procedure Date  . Colonoscopy   . Polypectomy   . Prostate biopsy 02/2001    repeat in 11/2002 Dr. Sinclair Grooms at [facility]    Outpatient Encounter Prescriptions as of 10/17/2011  Medication Sig Dispense Refill  . aspirin 81 MG tablet Take 81 mg by mouth daily.      . bimatoprost (LUMIGAN) 0.01 % SOLN Place 1 drop into both eyes at bedtime.      . bisacodyl (DULCOLAX) 5 MG EC tablet Take 10 mg by mouth daily as needed. Constipation      . doxazosin (CARDURA) 8 MG tablet Take 8 mg by mouth daily.       Marland Kitchen ketotifen (ZADITOR) 0.025 % ophthalmic solution Place 1 drop into both eyes 2 (two) times daily as needed. For pain.      Marland Kitchen latanoprost (XALATAN) 0.005 % ophthalmic solution Place 1 drop into both eyes at bedtime.      Marland Kitchen  oxyCODONE-acetaminophen (PERCOCET/ROXICET) 5-325 MG per tablet Take 1 tablet by mouth every 8 (eight) hours as needed for pain.  15 tablet  0  . Pseudoephedrine-APAP-DM (TYLENOL COLD/FLU SEVERE DAY PO) Take 1-2 tablets by mouth daily as needed. For cold symptoms.      . castor oil liquid Take 30 mLs by mouth daily as needed. constipation      . DISCONTD: aspirin 325 MG tablet Take 325 mg by mouth daily.        Allergies  Allergen Reactions  . Amlodipine Besylate     REACTION: causes nightmares  . Tamsulosin     REACTION: pt states intol to Flomax w/ dizziness    Review of Systems        See HPI - all other systems neg except as noted... The patient denies anorexia, fever, weight loss, weight gain, vision loss, decreased hearing, hoarseness, chest pain, syncope, dyspnea on exertion, peripheral edema, prolonged cough, headaches, hemoptysis, abdominal pain, melena, hematochezia, severe indigestion/heartburn, hematuria, incontinence, muscle weakness, suspicious skin lesions, transient blindness, difficulty walking, depression, unusual weight change, abnormal bleeding, enlarged lymph nodes, and angioedema.    Objective:   Physical Exam  WD, Overweight, 70 y/o BM in NAD... GENERAL:  Alert & oriented; pleasant & cooperative... HEENT:  [place]/AT, EOM-wnl, PERRLA, EACs-clear, TMs-wnl, NOSE-clear, THROAT-clear & wnl. NECK:  Supple w/ fairROM; no JVD; normal carotid impulses w/o bruits; no thyromegaly or nodules palpated; no lymphadenopathy. CHEST:  Clear to P & A; without wheezes/ rales/ or rhonchi. HEART:  Regular Rhythm; without murmurs/ rubs/ or gallops. ABDOMEN:  Soft & nontender; normal bowel sounds; no organomegaly or masses detected. Rectal exam is deferred... EXT: without deformities, mild arthritic changes; no varicose veins/ venous insuffic/ or edema. NEURO:  CN's intact; motor testing normal; sensory testing normal; gait normal & balance OK. DERM:  No lesions noted; no rash  etc...  RADIOLOGY DATA:  Reviewed in the EPIC EMR & discussed w/ the patient...  LABORATORY DATA:  Reviewed in the EPIC EMR & discussed w/ the patient...   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  HBP>  He admits to not taking the Doxazosin regularly;  Asked to do so to decr stroke risk & assess efficacy of this med for his BP; he wants to take 1/2 tab daily due to perceived side effects noted above> OK but take 1/2 tab every day.  CHOL>  Remains on diet alone w/ fair numbers, he refuses med Rx...  GI>  IBS, Constipation, Polyps, hx Hems>  Followed by DrJacobs- s/p colonoscopy w/ removal of adenomaous polyp 5/12 & repeat suggested 71yrs; he has chr constip vs IBS-C and rec to take regular laxative meds w/ MIRALAX & SENAKOT-S (if not effective, may need Amitiza vs Linzess).  Son has AS hemoglobin>  Pt wants test for sickle trait & we will order a hemoglobin electrophoresis at his request (test wasn't carried out by the lab)...   Patient's Medications  New Prescriptions   No medications on file  Previous Medications   ASPIRIN 81 MG TABLET    Take 81 mg by mouth daily.   BIMATOPROST (LUMIGAN) 0.01 % SOLN    Place 1 drop into both eyes at bedtime.   BISACODYL (DULCOLAX) 5 MG EC TABLET    Take 10 mg by mouth daily as needed. Constipation   CASTOR OIL LIQUID    Take 30 mLs by mouth daily as needed. constipation   DOXAZOSIN (CARDURA) 8 MG TABLET    Take 8 mg by mouth daily.    KETOTIFEN (ZADITOR) 0.025 % OPHTHALMIC SOLUTION    Place 1 drop into both eyes 2 (two) times daily as needed. For pain.   LATANOPROST (XALATAN) 0.005 % OPHTHALMIC SOLUTION    Place 1 drop into both eyes at bedtime.   OXYCODONE-ACETAMINOPHEN (PERCOCET/ROXICET)  5-325 MG PER TABLET    Take 1 tablet by mouth every 8 (eight) hours as needed for pain.   PSEUDOEPHEDRINE-APAP-DM (TYLENOL COLD/FLU SEVERE DAY PO)    Take 1-2 tablets by mouth daily as needed. For cold symptoms.  Modified Medications   No medications on file  Discontinued Medications   ASPIRIN 325 MG TABLET    Take 325 mg by mouth daily.

## 2011-10-24 ENCOUNTER — Telehealth: Payer: Self-pay | Admitting: Pulmonary Disease

## 2011-10-24 NOTE — Telephone Encounter (Signed)
I spoke with spouse and stated pt has a catheter and noticed some bleeding last night. She has not called urology yet. I advised her she needed to call urology and speak with them since they placed this. She will do so. Nothing further was needed

## 2011-10-30 DIAGNOSIS — H4011X0 Primary open-angle glaucoma, stage unspecified: Secondary | ICD-10-CM | POA: Diagnosis not present

## 2011-11-02 DIAGNOSIS — R339 Retention of urine, unspecified: Secondary | ICD-10-CM | POA: Diagnosis not present

## 2011-11-13 DIAGNOSIS — R339 Retention of urine, unspecified: Secondary | ICD-10-CM | POA: Diagnosis not present

## 2011-11-29 DIAGNOSIS — C61 Malignant neoplasm of prostate: Secondary | ICD-10-CM | POA: Diagnosis not present

## 2011-12-12 DIAGNOSIS — R339 Retention of urine, unspecified: Secondary | ICD-10-CM | POA: Diagnosis not present

## 2011-12-12 DIAGNOSIS — C61 Malignant neoplasm of prostate: Secondary | ICD-10-CM | POA: Diagnosis not present

## 2011-12-17 ENCOUNTER — Other Ambulatory Visit: Payer: Self-pay | Admitting: Urology

## 2011-12-27 ENCOUNTER — Encounter (HOSPITAL_BASED_OUTPATIENT_CLINIC_OR_DEPARTMENT_OTHER): Payer: Self-pay | Admitting: *Deleted

## 2011-12-31 ENCOUNTER — Encounter (HOSPITAL_BASED_OUTPATIENT_CLINIC_OR_DEPARTMENT_OTHER): Payer: Self-pay | Admitting: *Deleted

## 2011-12-31 NOTE — Progress Notes (Signed)
SPOKE W/ WIFE.  NPO AFTER MN WITH EXCEPTION CLEAR LIQUIDS UNTIL 0830 (NO CREAM/ MILK PRODUCTS). ARRIVE AT 1315. NEEDS ISTAT AND EKG. WILL DO FLEET ENEMA AM OF SURG W/ SIP OF WATER.  FYI; YOU MAY FIND PT ANXIOUS , DENIES DX (ALTHOUGH DX IN 2003 AND REFUSED TX AND THINKS THAT WAS CURED UNTIL NOW) AND HAS CATHETER DUE TO OBSTRUCTION.

## 2012-01-02 NOTE — H&P (Signed)
History of Present Illness  [name] is a 71 year old with the following urologic history:  1) Prostate cancer: He was initially diagnosed with clinical stage T1c Nx Mx adenocarcinoma of the prostate January 2003 by Dr. Larey Dresser. His PSA at diagnosis was 8.4 and when repeated in March 2003 was 9.84. He sought multiple second opinions to discuss treatment and mangement options and was seen by Dr. Retta Diones, Dr. Dan Humphreys, and Dr. Sinclair Grooms at [facility]. He subsequently decided to proceed with surveillance and had been followed at [facility] until September 2009. He transferred his care to me in October 2010. I had recommended that he undergo a surveillance biopsy at that time considering that his last biopsy was in 2004. He initially refused a biopsy but finally proceeded with a surveillance biopsy in June 2011 when his PSA increased to 16.3. This continued to indicated low volume, Gleason 6 prostate cancer and he elected to continue surveillance.  Initial diagnosis: January 2003 TNM stage: cT1c Nx Mx Gleason score: 3+3=6 PSA at diagnosis: 8.4  Prior biopsies: Jan 2003: Gleason 3+3=6 adenocarcinoma, 5% of right biopsies Aug 2004: Negative June 2011: 20 core (1 positive) - L apex (3+3=6, 40%), Vol 117 cc, PSAD 0.14  2) BPH/LUTS: His baseline symptoms include frequency and nocturia. He has been unable to tolerate tamsulosin in the past. Baseline IPSS: 19. Current treatment: Doxazosin 2 mg   3) Erectile dysfunction: He has mild erectile dysfunction. SHIM:17.  He has not undergone treatment.  4) Premature ejaculation: Baseline intravaginal latency time was less than 2 minutes Current treatment: Tramadol 25 mg prn  Interval history:  He follows up today for a voiding trial and further evaluation of his urinary retention and recent history of prostatitis as well as for his elevated PSA. He has been doing relatively well denies any fever or pain symptoms.     Past Medical History Problems  1.  History of  Hypercholesterolemia 272.0 2. History of  Hypertension 401.9 3. Prostate Cancer 185  Surgical History Problems  1. History of  No Surgical Problems  Current Meds 1. Aspirin 81 MG Oral Tablet; Therapy: (Recorded:01Feb2013) to 2. Doxazosin Mesylate 2 MG Oral Tablet; Therapy: (Recorded:01Nov2011) to 3. Doxazosin Mesylate 4 MG Oral Tablet; TAKE 1 TABLET DAILY AS DIRECTED; Therapy:  20Sep2013 to (Evaluate:19Nov2013)  Requested for: 20Sep2013; Last Rx:20Sep2013  Allergies Medication  1. No Known Drug Allergies  Family History Problems  1. Paternal history of  Diabetes Mellitus V18.0 2. Paternal history of  Heart Disease V17.49 3. Maternal history of  Stroke Syndrome V17.1 Denied  4. Family history of  Prostate Cancer  Social History Problems  1. Former Smoker He quit smoking in 1960. 2. Marital History - Currently Married 3. Occupation: retired  Physical Exam Constitutional: Well nourished and well developed . No acute distress.  Genitourinary: Examination of the penis demonstrates an indwelling catheter.    Results/Data Selected Results  PSA 17Oct2013 09:00AM Heloise Purpura  SPECIMEN TYPE: BLOOD   Test Name Result Flag Reference  PSA 30.61 ng/mL H <=4.00  Test Methodology: ECLIA PSA (Electrochemiluminescence Immunoassay)   PSA Flowsheet Tempie Donning Includes: All]   ** Printed in Appendix #1 below.  Procedure  His bladder was filled with 150 cc of sterile saline. He was able to void 40 cc with a maximum flow rate of 2 cc per second and a flattened voiding curve.  I discussed options with John [name]. He elected to leave his catheter out and followed up later in the afternoon approximately  5 hours after he left the office. At that point, he was unable to void and was uncomfortable in the suprapubic region. An 75 French catheter was then inserted with return of over 500 cc of grossly clear urine.   Assessment  Acute Urinary Retention 788.20    Plan Acute  Urinary Retention (788.20)  1. Follow-up Office  Follow-up  Requested for: 30Oct2013  Discussion/Summary  1. Prostate cancer: Considering his persistently elevated PSA, I did recommend proceeding with further evaluation of his prostate cancer prior to any intervention of his retention. Although his PSA elevation may be related to his recent prostatitis and indwelling catheter, he is over due for a surveillance biopsy and understands the importance. Due to his anxiety about proceeding with a biopsy in the office, this will be performed under IV sedation in the operating room. He will be administered preoperative antibiotics and we have reviewed the potential risks and complications, alternative options, and expected recovery process associated with this procedure.  2. Urinary retention: Since he is undergoing his prostate biopsy under IV sedation the operating room, I recommended that I performed flexible cystoscopy at that time to further evaluate his prostate anatomy. His prostate ultrasound that day also will give me an idea about his prostate volume at this point which previously has been measured at over 100 cc. If he does not have obvious progression of his cancer, we will then discuss proceeding with urodynamics to assess evaluation of his detrusor function and appropriateness for considering a bladder outlet obstruction procedure if he does not wish to proceed with definitive treatment of his prostate cancer. He is agreeable to this plan.  CC: Dr. Alroy Dust     Signatures Electronically signed by : Heloise Purpura, M.D.; Dec 12 2011  5:05PM  Appendix #1   PSA Flowsheet Tempie Donning Includes: All]  Patient: John, [name]; DOB: 1940-02-15; MRN: [id number]    17Oct2013 23Aug2013 01Feb2013 28Sep2012 03Aug2012 03Jan2012 05Apr2011  PSA 30.61 ng/mL 30.04 ng/mL 15.28 ng/mL 16.83 ng/mL 25.05 ng/mL 14.93 ng/mL 16.30 ng/mL  Free PSA         % Free PSA

## 2012-01-03 ENCOUNTER — Encounter (HOSPITAL_BASED_OUTPATIENT_CLINIC_OR_DEPARTMENT_OTHER): Payer: Self-pay | Admitting: Anesthesiology

## 2012-01-03 ENCOUNTER — Encounter (HOSPITAL_BASED_OUTPATIENT_CLINIC_OR_DEPARTMENT_OTHER)
Admission: RE | Disposition: A | Discharge status: Home / Self Care | Payer: Self-pay | Source: Ambulatory Visit | Attending: Urology

## 2012-01-03 ENCOUNTER — Ambulatory Visit (HOSPITAL_BASED_OUTPATIENT_CLINIC_OR_DEPARTMENT_OTHER)
Admission: RE | Admit: 2012-01-03 | Discharge: 2012-01-03 | Disposition: A | Discharge status: Home / Self Care | Payer: Medicare Other | Source: Ambulatory Visit | Attending: Urology | Admitting: Urology

## 2012-01-03 ENCOUNTER — Other Ambulatory Visit: Payer: Self-pay

## 2012-01-03 ENCOUNTER — Ambulatory Visit (HOSPITAL_BASED_OUTPATIENT_CLINIC_OR_DEPARTMENT_OTHER): Payer: Medicare Other | Admitting: Anesthesiology

## 2012-01-03 ENCOUNTER — Encounter (HOSPITAL_BASED_OUTPATIENT_CLINIC_OR_DEPARTMENT_OTHER): Payer: Self-pay | Admitting: *Deleted

## 2012-01-03 DIAGNOSIS — N411 Chronic prostatitis: Secondary | ICD-10-CM | POA: Diagnosis not present

## 2012-01-03 DIAGNOSIS — C61 Malignant neoplasm of prostate: Secondary | ICD-10-CM | POA: Insufficient documentation

## 2012-01-03 DIAGNOSIS — Z7982 Long term (current) use of aspirin: Secondary | ICD-10-CM | POA: Insufficient documentation

## 2012-01-03 DIAGNOSIS — N401 Enlarged prostate with lower urinary tract symptoms: Secondary | ICD-10-CM | POA: Insufficient documentation

## 2012-01-03 DIAGNOSIS — N138 Other obstructive and reflux uropathy: Secondary | ICD-10-CM | POA: Insufficient documentation

## 2012-01-03 DIAGNOSIS — I10 Essential (primary) hypertension: Secondary | ICD-10-CM | POA: Diagnosis not present

## 2012-01-03 DIAGNOSIS — F524 Premature ejaculation: Secondary | ICD-10-CM | POA: Insufficient documentation

## 2012-01-03 DIAGNOSIS — E7800 Pure hypercholesterolemia, unspecified: Secondary | ICD-10-CM | POA: Insufficient documentation

## 2012-01-03 DIAGNOSIS — N529 Male erectile dysfunction, unspecified: Secondary | ICD-10-CM | POA: Diagnosis not present

## 2012-01-03 DIAGNOSIS — R339 Retention of urine, unspecified: Secondary | ICD-10-CM | POA: Insufficient documentation

## 2012-01-03 DIAGNOSIS — Z87891 Personal history of nicotine dependence: Secondary | ICD-10-CM | POA: Diagnosis not present

## 2012-01-03 HISTORY — DX: Retention of urine, unspecified: R33.9

## 2012-01-03 HISTORY — DX: Unspecified glaucoma: H40.9

## 2012-01-03 HISTORY — DX: Malignant neoplasm of prostate: C61

## 2012-01-03 HISTORY — PX: PROSTATE BIOPSY: SHX241

## 2012-01-03 HISTORY — PX: CYSTOSCOPY: SHX5120

## 2012-01-03 HISTORY — DX: Other obstructive and reflux uropathy: N40.1

## 2012-01-03 HISTORY — DX: Benign prostatic hyperplasia with lower urinary tract symptoms: N13.8

## 2012-01-03 LAB — POCT I-STAT 4, (NA,K, GLUC, HGB,HCT)
Glucose, Bld: 85 mg/dL (ref 70–99)
HCT: 40 % (ref 39.0–52.0)
Hemoglobin: 13.6 g/dL (ref 13.0–17.0)
Potassium: 4.5 mEq/L (ref 3.5–5.1)

## 2012-01-03 SURGERY — BIOPSY, PROSTATE, RECTAL APPROACH, WITH US GUIDANCE
Anesthesia: Monitor Anesthesia Care | Site: Prostate | Wound class: Clean Contaminated

## 2012-01-03 MED ORDER — LACTATED RINGERS IV SOLN
INTRAVENOUS | Status: DC
  Administered 2012-01-03 (×2): via INTRAVENOUS
  Filled 2012-01-03: qty 1000

## 2012-01-03 MED ORDER — MEPERIDINE HCL 25 MG/ML IJ SOLN
6.2500 mg | INTRAMUSCULAR | Status: DC | PRN
  Filled 2012-01-03: qty 1

## 2012-01-03 MED ORDER — OXYCODONE HCL 5 MG PO TABS
5.0000 mg | ORAL_TABLET | Freq: Once | ORAL | Status: DC | PRN
  Filled 2012-01-03: qty 1

## 2012-01-03 MED ORDER — ACETAMINOPHEN 10 MG/ML IV SOLN
1000.0000 mg | Freq: Once | INTRAVENOUS | Status: DC | PRN
  Filled 2012-01-03: qty 100

## 2012-01-03 MED ORDER — STERILE WATER FOR IRRIGATION IR SOLN
Status: DC | PRN
  Administered 2012-01-03: 3000 mL

## 2012-01-03 MED ORDER — DEXTROSE 5 % IV SOLN
1.0000 g | Freq: Once | INTRAVENOUS | Status: AC
  Administered 2012-01-03: 2 g via INTRAVENOUS
  Filled 2012-01-03: qty 10

## 2012-01-03 MED ORDER — HYDROMORPHONE HCL PF 1 MG/ML IJ SOLN
0.2500 mg | INTRAMUSCULAR | Status: DC | PRN
  Filled 2012-01-03: qty 1

## 2012-01-03 MED ORDER — OXYCODONE HCL 5 MG/5ML PO SOLN
5.0000 mg | Freq: Once | ORAL | Status: DC | PRN
  Filled 2012-01-03: qty 5

## 2012-01-03 MED ORDER — LIDOCAINE HCL (CARDIAC) 20 MG/ML IV SOLN
INTRAVENOUS | Status: DC | PRN
  Administered 2012-01-03: 75 mg via INTRAVENOUS

## 2012-01-03 MED ORDER — PROPOFOL 10 MG/ML IV BOLUS
INTRAVENOUS | Status: DC | PRN
  Administered 2012-01-03: 30 mg via INTRAVENOUS

## 2012-01-03 MED ORDER — MIDAZOLAM HCL 5 MG/5ML IJ SOLN
INTRAMUSCULAR | Status: DC | PRN
  Administered 2012-01-03: 2 mg via INTRAVENOUS

## 2012-01-03 MED ORDER — PROPOFOL 10 MG/ML IV EMUL
INTRAVENOUS | Status: DC | PRN
  Administered 2012-01-03: 75 ug/kg/min via INTRAVENOUS

## 2012-01-03 MED ORDER — LIDOCAINE HCL 2 % IJ SOLN
INTRAMUSCULAR | Status: DC | PRN
  Administered 2012-01-03: 12 mL

## 2012-01-03 MED ORDER — FENTANYL CITRATE 0.05 MG/ML IJ SOLN
INTRAMUSCULAR | Status: DC | PRN
  Administered 2012-01-03 (×2): 50 ug via INTRAVENOUS
  Administered 2012-01-03 (×2): 25 ug via INTRAVENOUS

## 2012-01-03 MED ORDER — ONDANSETRON HCL 4 MG/2ML IJ SOLN
INTRAMUSCULAR | Status: DC | PRN
  Administered 2012-01-03: 4 mg via INTRAVENOUS

## 2012-01-03 MED ORDER — PROMETHAZINE HCL 25 MG/ML IJ SOLN
6.2500 mg | INTRAMUSCULAR | Status: DC | PRN
  Filled 2012-01-03: qty 1

## 2012-01-03 MED ORDER — LIDOCAINE HCL 2 % EX GEL
CUTANEOUS | Status: DC | PRN
  Administered 2012-01-03: 1

## 2012-01-03 SURGICAL SUPPLY — 29 items
BAG DRAIN URO-CYSTO SKYTR STRL (DRAIN) ×2 IMPLANT
BAG DRN UROCATH (DRAIN)
BAG URINE LEG 500ML (DRAIN) ×1 IMPLANT
CANISTER SUCT LVC 12 LTR MEDI- (MISCELLANEOUS) IMPLANT
CATH FOLEY 2WAY SLVR  5CC 18FR (CATHETERS) ×1
CATH FOLEY 2WAY SLVR 5CC 18FR (CATHETERS) IMPLANT
CLOTH BEACON ORANGE TIMEOUT ST (SAFETY) ×2 IMPLANT
DRAPE CAMERA CLOSED 9X96 (DRAPES) ×2 IMPLANT
DRESSING TELFA 8X3 (GAUZE/BANDAGES/DRESSINGS) ×3 IMPLANT
ELECT REM PT RETURN 9FT ADLT (ELECTROSURGICAL)
ELECTRODE REM PT RTRN 9FT ADLT (ELECTROSURGICAL) ×2 IMPLANT
GLOVE BIO SURGEON STRL SZ 6.5 (GLOVE) ×1 IMPLANT
GLOVE BIO SURGEON STRL SZ7.5 (GLOVE) ×3 IMPLANT
GLOVE ECLIPSE 6.5 STRL STRAW (GLOVE) ×1 IMPLANT
GOWN STRL REIN XL XLG (GOWN DISPOSABLE) ×3 IMPLANT
GOWN XL W/COTTON TOWEL STD (GOWNS) ×2 IMPLANT
NDL SAFETY ECLIPSE 18X1.5 (NEEDLE) IMPLANT
NDL SPNL 22GX7 QUINCKE BK (NEEDLE) IMPLANT
NEEDLE HYPO 18GX1.5 SHARP (NEEDLE)
NEEDLE HYPO 22GX1.5 SAFETY (NEEDLE) IMPLANT
NEEDLE SPNL 22GX7 QUINCKE BK (NEEDLE) IMPLANT
NS IRRIG 500ML POUR BTL (IV SOLUTION) IMPLANT
PACK CYSTOSCOPY (CUSTOM PROCEDURE TRAY) ×2 IMPLANT
SURGILUBE 2OZ TUBE FLIPTOP (MISCELLANEOUS) ×2 IMPLANT
SYR 20CC LL (SYRINGE) IMPLANT
TOWEL OR 17X24 6PK STRL BLUE (TOWEL DISPOSABLE) ×3 IMPLANT
UNDERPAD 30X30 INCONTINENT (UNDERPADS AND DIAPERS) ×3 IMPLANT
WATER STERILE IRR 3000ML UROMA (IV SOLUTION) ×4 IMPLANT
WATER STERILE IRR 500ML POUR (IV SOLUTION) ×3 IMPLANT

## 2012-01-03 NOTE — Anesthesia Preprocedure Evaluation (Addendum)
Anesthesia Evaluation  Patient identified by MRN, date of birth, ID band Patient awake    Reviewed: Allergy & Precautions, H&P , NPO status , Patient's Chart, lab work & pertinent test results  Airway Mallampati: II TM Distance: >3 FB Neck ROM: Full    Dental  (+) Dental Advisory Given and Teeth Intact   Pulmonary former smoker,  breath sounds clear to auscultation  Pulmonary exam normal       Cardiovascular hypertension, Pt. on medications + dysrhythmias Rhythm:Regular Rate:Normal     Neuro/Psych PSYCHIATRIC DISORDERS Anxiety negative neurological ROS     GI/Hepatic negative GI ROS, Neg liver ROS,   Endo/Other  negative endocrine ROS  Renal/GU negative Renal ROS     Musculoskeletal negative musculoskeletal ROS (+)   Abdominal   Peds  Hematology negative hematology ROS (+)   Anesthesia Other Findings   Reproductive/Obstetrics                         Anesthesia Physical Anesthesia Plan  ASA: II  Anesthesia Plan: MAC   Post-op Pain Management:    Induction: Intravenous  Airway Management Planned: Simple Face Mask  Additional Equipment:   Intra-op Plan:   Post-operative Plan:   Informed Consent: I have reviewed the patients History and Physical, chart, labs and discussed the procedure including the risks, benefits and alternatives for the proposed anesthesia with the patient or authorized representative who has indicated his/her understanding and acceptance.   Dental advisory given  Plan Discussed with: CRNA  Anesthesia Plan Comments:         Anesthesia Quick Evaluation

## 2012-01-03 NOTE — Op Note (Signed)
Preoperative diagnosis: 1. Prostate cancer 2. Urinary retention  Postoperative diagnosis: 1. Prostate cancer 2. Urinary retention  Procedures: 1. Flexible cystoscopy 2. Transrectal ultrasound of the prostate 3. Prostate needle biopsy  Surgeon: Dr. Heloise Purpura  Anesthesia: IV sedation  Complications: None  Estimated blood loss: Minimal  Indication: John [name] is a 71 year old gentleman with a history of low-risk prostate cancer who has been undergoing active surveillance and he recently developed urinary retention. He presents today for a surveillance prostate biopsy and for flexible cystoscopy to further evaluate his prostate anatomy to determine appropriate treatment of his urinary retention which has been refractory to multiple voiding trials. Informed consent has been obtained after discussing the potential risks, complications, alternative options, and expected recovery process associated with the above procedures.  Description of procedure: The patient was taken to the operating room and IV sedation was administered. He was prepped and draped in the usual sterile fashion and was administered preoperative antibiotics. He was placed in the supine position and flexible cystourethroscopy was performed. This demonstrated a normal anterior urethra. Inspection of the prostatic urethra revealed bilateral lobe hypertrophy with a medium size median lobe. The prostate length was approximately 3 cm. A survey of the bladder did reveal some expected edematous changes associated with his chronic catheter use. However, no suspicious bladder tumors or other abnormalities were identified. The cystoscope was then removed and a new 18 French Foley catheter was placed to drainage. He was then repositioned in the left lateral decubitus position. The 10 mHz transrectal ultrasound probe was then inserted into the rectum. A. Prostatic nerve block was then performed with 10 cc of 2% Xylocaine. The prostate was  then visualized and appeared to be homogeneous except for a hyperechoic area in the anterior aspect of the right mid section of the prostate. This measured approximately 1 x 1 cm. Prostate volume was estimated at 124.3 cc. 12 needle biopsies were then performed using a standard sextant template with biopsies from each of the right and left apical, mid, and base sections of the prostate. Biopsies were obtained from both the lateral and parasagittal sections. He did have some postoperative bleeding from the rectum following removal of the ultrasound probe. Pressure was applied with subsequent resolution of the bleeding. He was then able to be awakened and transferred to the recovery unit in satisfactory condition.

## 2012-01-03 NOTE — Interval H&P Note (Signed)
History and Physical Interval Note:  01/03/2012 1:56 PM  John [name]  has presented today for surgery, with the diagnosis of PROSTATE CANCER and urinary retention.  The various methods of treatment have been discussed with the patient and family. After consideration of risks, benefits and other options for treatment, the patient has consented to  Procedure(s) (LRB) with comments: BIOPSY TRANSRECTAL ULTRASONIC PROSTATE (TUBP) (N/A) - 45 MIN  CYSTOSCOPY FLEXIBLE (N/A) as a surgical intervention .  The patient's history has been reviewed, patient examined, no change in status, stable for surgery.  I have reviewed the patient's chart and labs.  Questions were answered to the patient's satisfaction.     Arvell Pulsifer,LES

## 2012-01-03 NOTE — Transfer of Care (Signed)
Immediate Anesthesia Transfer of Care Note  Patient: John [name]  Procedure(s) Performed: Procedure(s) (LRB): BIOPSY TRANSRECTAL ULTRASONIC PROSTATE (TUBP) (N/A) CYSTOSCOPY FLEXIBLE (N/A)  Patient Location: PACU  Anesthesia Type:MAC  Level of Consciousness: awake, alert  and oriented  Airway & Oxygen Therapy: Patient Spontanous Breathing and Patient connected to face mask oxygen  Post-op Assessment: Report given to PACU RN and Post -op Vital signs reviewed and stable  Post vital signs: Reviewed and stable  Complications: No apparent anesthesia complications

## 2012-01-04 NOTE — Anesthesia Postprocedure Evaluation (Signed)
Anesthesia Post Note  Patient: John [name]  Procedure(s) Performed: Procedure(s) (LRB): BIOPSY TRANSRECTAL ULTRASONIC PROSTATE (TUBP) (N/A) CYSTOSCOPY FLEXIBLE (N/A)  Anesthesia type: MAC  Patient location: PACU  Post pain: Pain level controlled  Post assessment: Post-op Vital signs reviewed  Last Vitals: BP 165/82  Pulse 56  Temp 36 C (Axillary)  Resp 20  Ht 6' 2.5" (1.892 m)  Wt 214 lb 8 oz (97.297 kg)  BMI 27.17 kg/m2  SpO2 95%  Post vital signs: Reviewed  Level of consciousness: awake  Complications: No apparent anesthesia complications

## 2012-01-07 ENCOUNTER — Encounter (HOSPITAL_BASED_OUTPATIENT_CLINIC_OR_DEPARTMENT_OTHER): Payer: Self-pay

## 2012-01-07 ENCOUNTER — Encounter (HOSPITAL_BASED_OUTPATIENT_CLINIC_OR_DEPARTMENT_OTHER): Payer: Self-pay | Admitting: Urology

## 2012-01-15 DIAGNOSIS — C61 Malignant neoplasm of prostate: Secondary | ICD-10-CM | POA: Diagnosis not present

## 2012-01-23 ENCOUNTER — Other Ambulatory Visit (HOSPITAL_COMMUNITY): Payer: Self-pay | Admitting: Urology

## 2012-01-23 DIAGNOSIS — C61 Malignant neoplasm of prostate: Secondary | ICD-10-CM

## 2012-01-30 DIAGNOSIS — H4011X0 Primary open-angle glaucoma, stage unspecified: Secondary | ICD-10-CM | POA: Diagnosis not present

## 2012-02-05 ENCOUNTER — Ambulatory Visit (HOSPITAL_COMMUNITY): Payer: Medicare Other

## 2012-02-05 ENCOUNTER — Encounter (HOSPITAL_COMMUNITY): Payer: Medicare Other

## 2012-02-12 DIAGNOSIS — C61 Malignant neoplasm of prostate: Secondary | ICD-10-CM | POA: Diagnosis not present

## 2012-02-14 ENCOUNTER — Encounter (HOSPITAL_COMMUNITY)
Admission: RE | Admit: 2012-02-14 | Discharge: 2012-02-14 | Disposition: A | Discharge status: Home / Self Care | Payer: Medicare Other | Source: Ambulatory Visit | Attending: Urology | Admitting: Urology

## 2012-02-14 DIAGNOSIS — C61 Malignant neoplasm of prostate: Secondary | ICD-10-CM | POA: Insufficient documentation

## 2012-02-14 MED ORDER — TECHNETIUM TC 99M MEDRONATE IV KIT
25.6000 | PACK | Freq: Once | INTRAVENOUS | Status: AC | PRN
  Administered 2012-02-14: 25.6 via INTRAVENOUS

## 2012-02-27 DIAGNOSIS — R339 Retention of urine, unspecified: Secondary | ICD-10-CM | POA: Diagnosis not present

## 2012-03-06 DIAGNOSIS — R339 Retention of urine, unspecified: Secondary | ICD-10-CM | POA: Diagnosis not present

## 2012-03-06 DIAGNOSIS — C61 Malignant neoplasm of prostate: Secondary | ICD-10-CM | POA: Diagnosis not present

## 2012-03-11 ENCOUNTER — Other Ambulatory Visit: Payer: Self-pay | Admitting: Urology

## 2012-03-19 ENCOUNTER — Encounter (HOSPITAL_COMMUNITY): Payer: Self-pay | Admitting: Pharmacy Technician

## 2012-03-19 NOTE — Patient Instructions (Signed)
John [name]  03/19/2012   Your procedure is scheduled on: 03/27/12     Report to [facility] at  1030   AM.  Call this number if you have problems the morning of surgery: [phone]   Remember:   Do not eat food or drink liquids after midnight.   Take these medicines the morning of surgery with A SIP OF WATER:    Do not wear jewelry,   Do not wear lotions, powders, or perfumes. .  . Men may shave face and neck.  Do not bring valuables to the hospital.  Contacts, dentures or bridgework may not be worn into surgery.  Leave suitcase in the car. After surgery it may be brought to your room.  For patients admitted to the hospital, checkout time is 11:00 AM the day of  discharge.       SEE CHG INSTRUCTION SHEET    Please read over the following fact sheets that you were given: MRSA Information, coughing and deep breathing exercises, leg exercises               Failure to comply with these instructions may result in cancellation of your surgery.                Patient Signature ____________________________              Nurse Signature _____________________________

## 2012-03-20 ENCOUNTER — Encounter (HOSPITAL_COMMUNITY)
Admission: RE | Admit: 2012-03-20 | Discharge: 2012-03-20 | Disposition: A | Discharge status: Home / Self Care | Payer: Medicare Other | Source: Ambulatory Visit | Attending: Urology | Admitting: Urology

## 2012-03-20 ENCOUNTER — Encounter (HOSPITAL_COMMUNITY): Payer: Self-pay

## 2012-03-20 ENCOUNTER — Telehealth: Payer: Self-pay | Admitting: Pulmonary Disease

## 2012-03-20 DIAGNOSIS — N401 Enlarged prostate with lower urinary tract symptoms: Secondary | ICD-10-CM | POA: Diagnosis not present

## 2012-03-20 DIAGNOSIS — C61 Malignant neoplasm of prostate: Secondary | ICD-10-CM | POA: Diagnosis not present

## 2012-03-20 DIAGNOSIS — Z87891 Personal history of nicotine dependence: Secondary | ICD-10-CM | POA: Diagnosis not present

## 2012-03-20 DIAGNOSIS — F524 Premature ejaculation: Secondary | ICD-10-CM | POA: Diagnosis not present

## 2012-03-20 DIAGNOSIS — R339 Retention of urine, unspecified: Secondary | ICD-10-CM | POA: Diagnosis not present

## 2012-03-20 DIAGNOSIS — Z79899 Other long term (current) drug therapy: Secondary | ICD-10-CM | POA: Diagnosis not present

## 2012-03-20 DIAGNOSIS — Z01812 Encounter for preprocedural laboratory examination: Secondary | ICD-10-CM | POA: Diagnosis not present

## 2012-03-20 DIAGNOSIS — N529 Male erectile dysfunction, unspecified: Secondary | ICD-10-CM | POA: Diagnosis not present

## 2012-03-20 DIAGNOSIS — I10 Essential (primary) hypertension: Secondary | ICD-10-CM | POA: Diagnosis not present

## 2012-03-20 LAB — BASIC METABOLIC PANEL
Calcium: 9.2 mg/dL (ref 8.4–10.5)
GFR calc non Af Amer: 53 mL/min — ABNORMAL LOW (ref >90)
Glucose, Bld: 101 mg/dL — ABNORMAL HIGH (ref 70–99)
Sodium: 142 mEq/L (ref 135–145)

## 2012-03-20 LAB — SURGICAL PCR SCREEN
MRSA, PCR: NEGATIVE
Staphylococcus aureus: NEGATIVE

## 2012-03-20 LAB — CBC
HCT: 39.8 % (ref 39.0–52.0)
MCHC: 35.4 g/dL (ref 30.0–36.0)
RDW: 14.2 % (ref 11.5–15.5)

## 2012-03-20 NOTE — Progress Notes (Signed)
03/20/12 0834  OBSTRUCTIVE SLEEP APNEA  Have you ever been diagnosed with sleep apnea through a sleep study? No  Do you snore loudly (loud enough to be heard through closed doors)?  1  Do you often feel tired, fatigued, or sleepy during the daytime? 0  Has anyone observed you stop breathing during your sleep? 1  Do you have, or are you being treated for high blood pressure? 1  BMI more than 35 kg/m2? 0  Age over 70 years old? 1  Gender: 1  Obstructive Sleep Apnea Score 5

## 2012-03-20 NOTE — Telephone Encounter (Signed)
Called and spoke with pts wife and she stated that his BP has been elevated.  165-170 at home.   pts wife feels that this is too high for him.  She stated that she is not sure if he has been taking his medication daily but i advised her that she will need to make sure that he takes hims medication daily and check his bp's at home.  She will call us back on Monday to let us know how his bp has been.  She will make sure that that he takes the medication daily.  Nothing further is needed.

## 2012-03-24 DIAGNOSIS — R339 Retention of urine, unspecified: Secondary | ICD-10-CM | POA: Diagnosis not present

## 2012-03-26 MED ORDER — GENTAMICIN SULFATE 40 MG/ML IJ SOLN
500.0000 mg | INTRAVENOUS | Status: DC
  Filled 2012-03-26: qty 12.5

## 2012-03-27 ENCOUNTER — Ambulatory Visit (HOSPITAL_COMMUNITY): Payer: Medicare Other | Admitting: Anesthesiology

## 2012-03-27 ENCOUNTER — Encounter (HOSPITAL_COMMUNITY): Payer: Self-pay | Admitting: *Deleted

## 2012-03-27 ENCOUNTER — Observation Stay (HOSPITAL_COMMUNITY)
Admission: RE | Admit: 2012-03-27 | Discharge: 2012-03-28 | Disposition: A | Discharge status: Home / Self Care | Payer: Medicare Other | Source: Ambulatory Visit | Attending: Urology | Admitting: Urology

## 2012-03-27 ENCOUNTER — Encounter (HOSPITAL_COMMUNITY)
Admission: RE | Disposition: A | Discharge status: Home / Self Care | Payer: Self-pay | Source: Ambulatory Visit | Attending: Urology

## 2012-03-27 ENCOUNTER — Encounter (HOSPITAL_COMMUNITY): Payer: Self-pay | Admitting: Anesthesiology

## 2012-03-27 DIAGNOSIS — N4289 Other specified disorders of prostate: Secondary | ICD-10-CM | POA: Diagnosis not present

## 2012-03-27 DIAGNOSIS — N529 Male erectile dysfunction, unspecified: Secondary | ICD-10-CM | POA: Insufficient documentation

## 2012-03-27 DIAGNOSIS — Z79899 Other long term (current) drug therapy: Secondary | ICD-10-CM | POA: Insufficient documentation

## 2012-03-27 DIAGNOSIS — R339 Retention of urine, unspecified: Secondary | ICD-10-CM | POA: Insufficient documentation

## 2012-03-27 DIAGNOSIS — C61 Malignant neoplasm of prostate: Secondary | ICD-10-CM | POA: Diagnosis not present

## 2012-03-27 DIAGNOSIS — I10 Essential (primary) hypertension: Secondary | ICD-10-CM | POA: Insufficient documentation

## 2012-03-27 DIAGNOSIS — N138 Other obstructive and reflux uropathy: Principal | ICD-10-CM | POA: Insufficient documentation

## 2012-03-27 DIAGNOSIS — N401 Enlarged prostate with lower urinary tract symptoms: Secondary | ICD-10-CM | POA: Insufficient documentation

## 2012-03-27 DIAGNOSIS — Z87891 Personal history of nicotine dependence: Secondary | ICD-10-CM | POA: Insufficient documentation

## 2012-03-27 DIAGNOSIS — Z01812 Encounter for preprocedural laboratory examination: Secondary | ICD-10-CM | POA: Insufficient documentation

## 2012-03-27 DIAGNOSIS — F524 Premature ejaculation: Secondary | ICD-10-CM | POA: Insufficient documentation

## 2012-03-27 HISTORY — PX: CYSTOSCOPY: SHX5120

## 2012-03-27 HISTORY — PX: TRANSURETHRAL RESECTION OF PROSTATE: SHX73

## 2012-03-27 SURGERY — TRANSURETHRAL RESECTION OF THE PROSTATE WITH GYRUS INSTRUMENTS
Anesthesia: General | Site: Perineum | Wound class: Clean Contaminated

## 2012-03-27 MED ORDER — SODIUM CHLORIDE 0.9 % IV SOLN
250.0000 mL | INTRAVENOUS | Status: DC | PRN
  Administered 2012-03-28: 250 mL via INTRAVENOUS

## 2012-03-27 MED ORDER — DIPHENHYDRAMINE HCL 50 MG/ML IJ SOLN: 12.5000 mg | Freq: Four times a day (QID) | INTRAMUSCULAR | Status: DC | PRN

## 2012-03-27 MED ORDER — EPHEDRINE SULFATE 50 MG/ML IJ SOLN
INTRAMUSCULAR | Status: DC | PRN
  Administered 2012-03-27: 5 mg via INTRAVENOUS

## 2012-03-27 MED ORDER — OXYCODONE HCL 5 MG PO TABS: 5.0000 mg | ORAL_TABLET | Freq: Once | ORAL | Status: DC | PRN

## 2012-03-27 MED ORDER — HYDROMORPHONE HCL PF 1 MG/ML IJ SOLN
INTRAMUSCULAR | Status: AC
  Filled 2012-03-27: qty 1

## 2012-03-27 MED ORDER — SODIUM CHLORIDE 0.45 % IV SOLN
INTRAVENOUS | Status: DC
  Administered 2012-03-27: 75 mL/h via INTRAVENOUS

## 2012-03-27 MED ORDER — PROPOFOL 10 MG/ML IV EMUL
INTRAVENOUS | Status: DC | PRN
  Administered 2012-03-27: 200 mg via INTRAVENOUS

## 2012-03-27 MED ORDER — FENTANYL CITRATE 0.05 MG/ML IJ SOLN
INTRAMUSCULAR | Status: DC | PRN
  Administered 2012-03-27 (×2): 50 ug via INTRAVENOUS

## 2012-03-27 MED ORDER — HYDROMORPHONE HCL PF 1 MG/ML IJ SOLN
0.2500 mg | INTRAMUSCULAR | Status: DC | PRN
  Administered 2012-03-27 (×4): 0.5 mg via INTRAVENOUS

## 2012-03-27 MED ORDER — DOXYCYCLINE HYCLATE 100 MG PO TABS
100.0000 mg | ORAL_TABLET | Freq: Two times a day (BID) | ORAL | Status: DC
  Administered 2012-03-27 – 2012-03-28 (×2): 100 mg via ORAL
  Filled 2012-03-27 (×3): qty 1

## 2012-03-27 MED ORDER — LATANOPROST 0.005 % OP SOLN
1.0000 [drp] | Freq: Every day | OPHTHALMIC | Status: DC
  Administered 2012-03-27: 1 [drp] via OPHTHALMIC
  Filled 2012-03-27: qty 2.5

## 2012-03-27 MED ORDER — OXYCODONE-ACETAMINOPHEN 5-325 MG PO TABS: 1.0000 | ORAL_TABLET | Freq: Four times a day (QID) | ORAL | Status: DC | PRN

## 2012-03-27 MED ORDER — MEPERIDINE HCL 50 MG/ML IJ SOLN: 6.2500 mg | INTRAMUSCULAR | Status: DC | PRN

## 2012-03-27 MED ORDER — PROMETHAZINE HCL 25 MG/ML IJ SOLN: 6.2500 mg | INTRAMUSCULAR | Status: DC | PRN

## 2012-03-27 MED ORDER — DIPHENHYDRAMINE HCL 12.5 MG/5ML PO ELIX: 12.5000 mg | ORAL_SOLUTION | Freq: Four times a day (QID) | ORAL | Status: DC | PRN

## 2012-03-27 MED ORDER — DOCUSATE SODIUM 100 MG PO CAPS
100.0000 mg | ORAL_CAPSULE | Freq: Two times a day (BID) | ORAL | Status: DC
  Administered 2012-03-27 – 2012-03-28 (×2): 100 mg via ORAL
  Filled 2012-03-27 (×3): qty 1

## 2012-03-27 MED ORDER — LACTATED RINGERS IV SOLN
INTRAVENOUS | Status: DC | PRN
  Administered 2012-03-27: 11:00:00 via INTRAVENOUS

## 2012-03-27 MED ORDER — SODIUM CHLORIDE 0.9 % IJ SOLN: 3.0000 mL | Freq: Two times a day (BID) | INTRAMUSCULAR | Status: DC

## 2012-03-27 MED ORDER — OXYCODONE HCL 5 MG/5ML PO SOLN
5.0000 mg | Freq: Once | ORAL | Status: DC | PRN
  Filled 2012-03-27: qty 5

## 2012-03-27 MED ORDER — ONDANSETRON HCL 4 MG/2ML IJ SOLN
4.0000 mg | Freq: Four times a day (QID) | INTRAMUSCULAR | Status: DC | PRN
  Administered 2012-03-27: 4 mg via INTRAVENOUS
  Filled 2012-03-27: qty 2

## 2012-03-27 MED ORDER — ACETAMINOPHEN 10 MG/ML IV SOLN: 1000.0000 mg | Freq: Once | INTRAVENOUS | Status: DC | PRN

## 2012-03-27 MED ORDER — DOCUSATE SODIUM 100 MG PO CAPS: 100.0000 mg | ORAL_CAPSULE | Freq: Two times a day (BID) | ORAL | Status: DC

## 2012-03-27 MED ORDER — SODIUM CHLORIDE 0.9 % IR SOLN
3000.0000 mL | Status: DC
  Administered 2012-03-27: 3000 mL

## 2012-03-27 MED ORDER — ZOLPIDEM TARTRATE 5 MG PO TABS
5.0000 mg | ORAL_TABLET | Freq: Every evening | ORAL | Status: DC | PRN
  Administered 2012-03-27: 5 mg via ORAL
  Filled 2012-03-27: qty 1

## 2012-03-27 MED ORDER — ACETAMINOPHEN 10 MG/ML IV SOLN
INTRAVENOUS | Status: DC | PRN
  Administered 2012-03-27: 1000 mg via INTRAVENOUS

## 2012-03-27 MED ORDER — SODIUM CHLORIDE 0.9 % IR SOLN
Status: DC | PRN
  Administered 2012-03-27: 21000 mL

## 2012-03-27 MED ORDER — ONDANSETRON HCL 4 MG/2ML IJ SOLN
INTRAMUSCULAR | Status: DC | PRN
  Administered 2012-03-27: 4 mg via INTRAVENOUS

## 2012-03-27 MED ORDER — GENTAMICIN SULFATE 40 MG/ML IJ SOLN
503.0000 mg | INTRAVENOUS | Status: DC | PRN
  Administered 2012-03-27: 500 mg via INTRAVENOUS

## 2012-03-27 MED ORDER — LIDOCAINE HCL 1 % IJ SOLN
INTRAMUSCULAR | Status: DC | PRN
  Administered 2012-03-27: 50 mg via INTRADERMAL

## 2012-03-27 MED ORDER — HYDROCODONE-ACETAMINOPHEN 5-325 MG PO TABS
1.0000 | ORAL_TABLET | ORAL | Status: DC | PRN
  Administered 2012-03-28 (×2): 1 via ORAL
  Filled 2012-03-27: qty 1
  Filled 2012-03-27: qty 2
  Filled 2012-03-27: qty 1

## 2012-03-27 MED ORDER — SODIUM CHLORIDE 0.9 % IJ SOLN: 3.0000 mL | INTRAMUSCULAR | Status: DC | PRN

## 2012-03-27 MED ORDER — DOXYCYCLINE HYCLATE 100 MG PO TABS: 100.0000 mg | ORAL_TABLET | Freq: Two times a day (BID) | ORAL | Status: DC

## 2012-03-27 MED ORDER — ACETAMINOPHEN 10 MG/ML IV SOLN
INTRAVENOUS | Status: AC
  Filled 2012-03-27: qty 100

## 2012-03-27 SURGICAL SUPPLY — 20 items
BAG URINE DRAINAGE (UROLOGICAL SUPPLIES) ×3 IMPLANT
BAG URO CATCHER STRL LF (DRAPE) ×3 IMPLANT
BLADE SURG 15 STRL LF DISP TIS (BLADE) IMPLANT
BLADE SURG 15 STRL SS (BLADE)
CATH FOLEY 3WAY 30CC 22FR (CATHETERS) ×3 IMPLANT
CLOTH BEACON ORANGE TIMEOUT ST (SAFETY) ×3 IMPLANT
DRAPE CAMERA CLOSED 9X96 (DRAPES) ×3 IMPLANT
ELECT REM PT RETURN 9FT ADLT (ELECTROSURGICAL) ×3
ELECTRODE REM PT RTRN 9FT ADLT (ELECTROSURGICAL) ×2 IMPLANT
EVACUATOR MICROVAS BLADDER (UROLOGICAL SUPPLIES) ×3 IMPLANT
GLOVE BIOGEL M STRL SZ7.5 (GLOVE) ×3 IMPLANT
GOWN STRL NON-REIN LRG LVL3 (GOWN DISPOSABLE) ×6 IMPLANT
HOLDER FOLEY CATH W/STRAP (MISCELLANEOUS) IMPLANT
KIT ASPIRATION TUBING (SET/KITS/TRAYS/PACK) ×3 IMPLANT
LOOPS RESECTOSCOPE DISP (ELECTROSURGICAL) ×2 IMPLANT
MANIFOLD NEPTUNE II (INSTRUMENTS) ×3 IMPLANT
PACK CYSTO (CUSTOM PROCEDURE TRAY) ×3 IMPLANT
SUT ETHILON 3 0 PS 1 (SUTURE) IMPLANT
SYR 30ML LL (SYRINGE) ×1 IMPLANT
TUBING CONNECTING 10 (TUBING) ×3 IMPLANT

## 2012-03-27 NOTE — Transfer of Care (Signed)
Immediate Anesthesia Transfer of Care Note  Patient: John [name]  Procedure(s) Performed: Procedure(s): TRANSURETHRAL RESECTION OF THE PROSTATE WITH GYRUS INSTRUMENTS (N/A) CYSTOSCOPY (N/A)  Patient Location: PACU  Anesthesia Type:General  Level of Consciousness: sedated, patient cooperative and responds to stimulation  Airway & Oxygen Therapy: Patient Spontanous Breathing and Patient connected to face mask oxygen  Post-op Assessment: Report given to PACU RN, Post -op Vital signs reviewed and stable and Patient moving all extremities X 4  Post vital signs: Reviewed and stable  Complications: No apparent anesthesia complications

## 2012-03-27 NOTE — Interval H&P Note (Signed)
History and Physical Interval Note:  03/27/2012 11:36 AM  John [name]  has presented today for surgery, with the diagnosis of BPH (benign prostatic hypertrophy) with urinary retention  The various methods of treatment have been discussed with the patient and family. After consideration of risks, benefits and other options for treatment, the patient has consented to  Procedure(s): TRANSURETHRAL RESECTION OF THE PROSTATE WITH GYRUS INSTRUMENTS (N/A) CYSTOSCOPY (N/A) as a surgical intervention .  The patient's history has been reviewed, patient examined, no change in status, stable for surgery.  I have reviewed the patient's chart and labs.  Questions were answered to the patient's satisfaction.     Monzerat Handler,LES

## 2012-03-27 NOTE — Anesthesia Postprocedure Evaluation (Signed)
Anesthesia Post Note  Patient: John [name]  Procedure(s) Performed: Procedure(s) (LRB): TRANSURETHRAL RESECTION OF THE PROSTATE WITH GYRUS INSTRUMENTS (N/A) CYSTOSCOPY (N/A)  Anesthesia type: General  Patient location: PACU  Post pain: Pain level controlled  Post assessment: Post-op Vital signs reviewed  Last Vitals: BP 137/73  Pulse 58  Temp(Src) 36.5 C  Resp 13  SpO2 100%  Post vital signs: Reviewed  Level of consciousness: sedated  Complications: No apparent anesthesia complications

## 2012-03-27 NOTE — Progress Notes (Signed)
Patient ID: John [name], male   DOB: 19-Jun-1940, 72 y.o.   MRN: [id number]  Post-op note  Subjective: The patient is doing well.  No complaints.  Objective: Vital signs in last 24 hours: Temp:  [97.3 F (36.3 C)-97.8 F (36.6 C)] 97.4 F (36.3 C) (02/13 1535) Pulse Rate:  [52-73] 52 (02/13 1535) Resp:  [8-20] 18 (02/13 1535) BP: (132-167)/(72-99) 161/89 mmHg (02/13 1535) SpO2:  [97 %-100 %] 100 % (02/13 1535)  Intake/Output from previous day:   Intake/Output this shift: Total I/O In: 7300 [I.V.:900; Other:6300; IV Piggyback:100] Out: 4900 [Urine:4850; Blood:50]  Physical Exam:  General: Alert and oriented. Abdomen: Soft, Nondistended. GU: Urine clear on slow CBI drip   Assessment/Plan: POD#0   1) Continue CBI   John [name], John [name]. MD   LOS: 0 days   John [name],John [name] 03/27/2012, 5:38 PM

## 2012-03-27 NOTE — Anesthesia Preprocedure Evaluation (Addendum)
Anesthesia Evaluation  Patient identified by MRN, date of birth, ID band Patient awake    Reviewed: Allergy & Precautions, H&P , NPO status , Patient's Chart, lab work & pertinent test results  Airway Mallampati: II TM Distance: >3 FB Neck ROM: Full    Dental  (+) Dental Advisory Given and Teeth Intact   Pulmonary former smoker,  breath sounds clear to auscultation  Pulmonary exam normal       Cardiovascular hypertension, Pt. on medications + dysrhythmias (PVCs) Rhythm:Regular Rate:Normal     Neuro/Psych PSYCHIATRIC DISORDERS Anxiety negative neurological ROS     GI/Hepatic negative GI ROS, Neg liver ROS,   Endo/Other  negative endocrine ROS  Renal/GU negative Renal ROS     Musculoskeletal negative musculoskeletal ROS (+)   Abdominal   Peds  Hematology negative hematology ROS (+)   Anesthesia Other Findings   Reproductive/Obstetrics                           Anesthesia Physical  Anesthesia Plan  ASA: II  Anesthesia Plan: General   Post-op Pain Management:    Induction: Intravenous  Airway Management Planned: LMA  Additional Equipment:   Intra-op Plan:   Post-operative Plan: Extubation in OR  Informed Consent: I have reviewed the patients History and Physical, chart, labs and discussed the procedure including the risks, benefits and alternatives for the proposed anesthesia with the patient or authorized representative who has indicated his/her understanding and acceptance.   Dental advisory given  Plan Discussed with: CRNA  Anesthesia Plan Comments:         Anesthesia Quick Evaluation

## 2012-03-27 NOTE — H&P (Signed)
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 Larey Dresser. His PSA at diagnosis was 8.4 and when repeated in March 2003 was 9.84. He sought multiple second opinions to discuss treatment and mangement options and was seen by Dr. Retta Diones, Dr. Dan Humphreys, and Dr. Sinclair Grooms at [facility]. He subsequently decided to proceed with surveillance and had been followed at [facility] until September 2009. He transferred his care to me in October 2010. I had recommended that he undergo a surveillance biopsy at that time considering that his last biopsy was in 2004. He initially refused a biopsy but finally proceeded with a surveillance biopsy in June 2011 when his PSA increased to 16.3. This continued to indicated low volume, Gleason 6 prostate cancer and he elected to continue surveillance. He again was hesitant to have surveillance biopsies but he developed acute prostatitis and urinary retention and an increase of his PSA to 30 which remained 30 when rechecked.  Understanding the confounding variables affecting his PSA, I did recommend a repeat prostate biopsy before discussing options for his retention which persisted. His biopsy in November 2013 indicated persistent Gleason 6 disease but with increased volume indicating progression. We again had a detailed discussion in December 2013 about his prostate cancer treatment/management options especially considering his urinary retention at that time.  He decided that he still did not wish to undergo definitive therapy for his prostate cancer understanding the risk associated with progression of his prostate cancer.  Initial diagnosis: January 2003 TNM stage: cT1c Nx Mx Gleason score: 3+3=6 PSA at diagnosis: 8.4  Prior biopsies: Jan 2003: Gleason 3+3=6 adenocarcinoma, 5% of right biopsies Aug 2004:  Negative June 2011: 20 core (1 positive) - L apex (3+3=6, 40%), Vol 117 cc, PSAD 0.27 Dec 2011: 12 core - 4/12 cores positive -- L lateral apex (20%, 3+3=6), L apex (65%, 3+3=6), R apex (<2%, 3+3=6), R lateral apex (30%, 3+3=6), Chronic inflammation, Vol 124 cc  2) BPH/LUTS: His baseline symptoms include frequency and nocturia. He has been unable to tolerate tamsulosin in the past. Baseline IPSS: 19. He developed urinary retention after acute prostatitis in September 2013.  He has been unable to pass voiding trial despite maximum medical therapy. Current treatment: Doxazosin 8 mg   3) Erectile dysfunction: He has mild erectile dysfunction. SHIM:17.  He has not undergone treatment.  4) Premature ejaculation: Baseline intravaginal latency time was less than 2 minutes Current treatment: Tramadol 25 mg prn  Interval history:  He follows up today for further discussion regarding his treatment options for his prostate cancer and urinary retention. He did undergo staging studies including a CT scan and MRI which were negative for metastatic disease. I spoke with John [name] after these tests were completed and my recommendation was to strongly consider a radical prostatectomy but to treat his prostate cancer and his urinary retention which appeared to be related to benign prostatic hyperplasia and bladder outlet obstruction. He has been thoroughly counseled about his treatment options for prostate cancer as well as the risks that would potentially be involved. After considering his options, he was not willing to proceed with definitive curative therapy for his prostate cancer concern of the effects it would have on his quality of life. He therefore wished to proceed with further treatment of his urinary retention and follows up today after undergoing a  urodynamic study. He denies any fever, hematuria, or other complaints.     Past Medical History Problems  1. History of  Hypercholesterolemia 272.0 2.  History of  Hypertension 401.9 3. Prostate Cancer 185  Surgical History Problems  1. History of  Biopsy Of The Prostate Needle  Current Meds 1. Aspirin 81 MG Oral Tablet; Therapy: (Recorded:01Feb2013) to 2. Lumigan 0.01 % Ophthalmic Solution; Therapy: (Recorded:30Oct2013) to  Allergies Medication  1. No Known Drug Allergies  Family History Problems  1. Paternal history of  Diabetes Mellitus V18.0 2. Paternal history of  Heart Disease V17.49 3. Maternal history of  Stroke Syndrome V17.1 Denied  4. Family history of  Prostate Cancer  Social History Problems  1. Former Smoker He quit smoking in 1960. 2. Marital History - Currently Married 3. Occupation: retired  Review of Systems  Genitourinary: no hematuria.  Gastrointestinal: no nausea.  Constitutional: no fever.  Cardiovascular: no chest pain and no leg swelling.    Physical Exam Constitutional: Well nourished and well developed . No acute distress.  ENT:. The ears and nose are normal in appearance.  Neck: The appearance of the neck is normal and no neck mass is present.  Pulmonary: No respiratory distress and normal respiratory rhythm and effort.  Cardiovascular:. No peripheral edema.  Genitourinary: Examination of the penis demonstrates an indwelling catheter.  Skin: Normal skin turgor, no visible rash and no visible skin lesions.  Neuro/Psych:. Mood and affect are appropriate.    Results/Data Urine [Data Includes: Last 1 Day]   23Jan2014  COLOR YELLOW   APPEARANCE TURBID   SPECIFIC GRAVITY 1.020   pH 7.0   GLUCOSE NEG mg/dL  BILIRUBIN NEG   KETONE NEG mg/dL  BLOOD SMALL   PROTEIN TRACE mg/dL  UROBILINOGEN 0.2 mg/dL  NITRITE POS   LEUKOCYTE ESTERASE SMALL   SQUAMOUS EPITHELIAL/HPF RARE   WBC 3-6 WBC/hpf  RBC 3-6 RBC/hpf  BACTERIA FEW   CRYSTALS NONE SEEN   CASTS NONE SEEN   Other MODERATE AMORPHOUS MATERIAL   Selected Results  URINE CULTURE 15Jan2014 08:53AM Heloise Purpura  Source :  CATHED SPECIMEN TYPE: CATH URINE   Test Name Result Flag Reference  CULTURE, URINE Culture, Urine    ===== COLONY COUNT: =====  >=100,000 COLONIES/ML   FINAL REPORT: STAPHYLOCOCCUS SPECIES (COAGULASE NEGATIVE)  Rifampin and Gentamicin should not be used as single drugs for treatment of Staph infections.   Sensitivity for: STAPHYLOCOCCUS SPECIES (COAGULASE NEGATIVE)    PENICILLIN                             Resistant      >=0.5    OXACILLIN                              Resistant        >=4    CEFAZOLIN                              Resistant               GENTAMICIN                             Sensitive      <=0.5    CIPROFLOXACIN  Resistant        >=8    LEVOFLOXACIN                           Resistant        >=8    NITROFURANTOIN                         Sensitive       <=16    VANCOMYCIN                             Sensitive          2    RIFAMPIN                               Sensitive      <=0.5    TETRACYCLINE                           Sensitive          2    END OF REPORT   CT-PELVIS WITH CONTRAST 02Jan2014 12:00AM Heloise Purpura   Test Name Result Flag Reference  ** RADIOLOGY REPORT BY [facility] **   *RADIOLOGY REPORT*  Clinical Data: Prostate cancer diagnosed 2003. Positive biopsy January 03, 2012 increasing PSA.  CT PELVIS WITH CONTRAST  Technique: Multidetector CT imaging of the pelvis was performed using the standard protocol following the bolus administration of intravenous contrast.  Contrast:  100 ml Isovue  Comparison: CT abdomen pelvis 10/2011  Findings: Prostate gland is enlarged and indents the base of the bladder.  Foley catheter within the bladder. No gross evidence of extracapsular extension of the prostate cancer. Several small lymph nodes along the right internal iliac vessels measures 6 mm (image 31 and 30). These are unchanged from comparison exam. No new lymph nodes are present. No inguinal  lymphadenopathy.  The ureters and inferior aspects of the kidneys appear normal. 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 This are simple cysts extending from the lower poles of the left and right kidneys. No aggressive osseous lesions.  IMPRESSION:  1.. No evidence of prostate cancer metastasis.  2. Small right internal iliac lymph nodes are not changed in size from prior. 3. Prostate hypertrophy.   Original Report Authenticated By: Genevive Bi, M.D.     I reviewed his urodynamic findings with him today. This demonstrates preserved detrusor function with evidence of detrusor overactivity as well as a good voluntary detrusor contraction despite an inability to void. 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  He has previously undergone flexible cystoscopy which demonstrated a medium-sized median lobe and a prostatic urethral length of approximately 3 cm.  Assessment Assessed  1. Prostate Cancer 185 2. Acute Urinary Retention 788.20  Plan Health Maintenance (V70.0)  1. UA With REFLEX  Done: 23Jan2014 04:32PM Prostate Cancer (185)  2. Follow-up Office  Follow-up  Done: 23Jan2014 Pyuria (791.9)  3. Doxycycline Hyclate 100 MG Oral Capsule; TAKE 1 CAPSULE BID; Therapy: 23Jan2014 to  (Complete:30Jan2014); Last Rx:23Jan2014  Discussion/Summary  1. Prostate cancer: Fortunately, he does not have evidence for metastatic disease. Considering his PSA level, he technically has high risk localized prostate cancer. However, his PSA is likely an accurate considering other explanations for his PSA increase including  a history of prostatitis and an indwelling catheter. In the absence of obvious signs of more advanced disease, he does wish to consider with active surveillance and understands the potential risks involved with continued surveillance as well as my recommendation to strongly consider a radical prostatectomy in this setting of urinary retention. He would like  to proceed with treatment of his urinary retention and we will plan to check another PSA approximately 2 months after his treatment.  2. Urinary retention secondary to benign prostatic hyperplasia: It appears he would benefit from a bladder outlet obstruction procedure. He does have a large prostate although wishes to avoid invasive therapy if possible. We therefore discussed the option of a transurethral resection of the prostate which I think should give him a very good chance of being able to void again considering his median lobe which appears to be a significant component of his problem. We have reviewed the potential risks, complications, and alternative options associated with this procedure as well as the expected recovery process. We have specifically discussed the potential risks including but not limited to bleeding, infection, cardiopulmonary risks associated with anesthesia, erectile dysfunction, incontinence, retrograde ejaculation, et Karie Soda. He does wish to proceed and will receive preoperative doxycycline based on his recent urine culture. He will receive gentamicin perioperatively.  Cc: Dr. Alroy Dust     Signatures Electronically signed by : Heloise Purpura, M.D.; Mar 06 2012  5:27PM

## 2012-03-27 NOTE — Preoperative (Signed)
Beta Blockers   Reason not to administer Beta Blockers:Not Applicable 

## 2012-03-27 NOTE — Op Note (Signed)
Preoperative diagnosis: 1. Urinary retention 2. Prostate cancer  Postoperative diagnosis:  1. Urinary retention 2. Prostate cancer  Procedure:  1. Cystoscopy 2. Transurethral resection of the prostate  Surgeon: Moody Bruins. M.D.  Anesthesia: General  Complications: None  EBL: Minimal  Specimens: 1. Prostate chips  Disposition of specimens: Pathology  Indication: John [name] is a patient with urinary retention. After reviewing the management options for treatment, he elected to proceed with the above surgical procedure(s). He does have prostate cancer and has been undergoing active surveillance. We discussed treatment with a radical prostatectomy which he ultimately declined.  He wished to proceed with a TURP and continue surveillance of his prostate cancer. We have discussed the potential benefits and risks of the procedure, side effects of the proposed treatment, the likelihood of the patient achieving the goals of the procedure, and any potential problems that might occur during the procedure or recuperation. Informed consent has been obtained.  Description of procedure:  The patient was taken to the operating room and general anesthesia was induced.  The patient was placed in the dorsal lithotomy position, prepped and draped in the usual sterile fashion, and preoperative antibiotics were administered. A preoperative time-out was performed.   Cystourethroscopy was performed.  The patient's urethra was examined and demonstrated bilobar prostatic hypertrophy with a median lobe. His prostatic urethra measured 3-4 cm.   The bladder was then systematically examined in its entirety. There was no evidence of any bladder tumors, stones, or other mucosal pathology.  The ureteral orifices were identified and marked so as to be avoided during the procedure.  The prostate adenoma was then resected utilizing loop cautery resection with the bipolar cutting loop.  The prostate  adenoma from the bladder neck back to the verumontanum was resected beginning at the six o'clock position and then extended to include the right and left lobes of the prostate and anterior prostate. Care was taken not to resect distal to the verumontanum.  Hemostasis was then achieved with the cautery and the bladder was emptied and reinspected with no significant bleeding noted at the end of the procedure.    A 3 way catheter was then placed into the bladder and placed on continuous bladder irrigation.  The patient appeared to tolerate the procedure well and without complications.  The patient was able to be awakened and transferred to the recovery unit in satisfactory condition.

## 2012-03-28 ENCOUNTER — Encounter (HOSPITAL_COMMUNITY): Payer: Self-pay | Admitting: Urology

## 2012-03-28 MED ORDER — BELLADONNA ALKALOIDS-OPIUM 16.2-60 MG RE SUPP
1.0000 | Freq: Four times a day (QID) | RECTAL | Status: DC | PRN
  Administered 2012-03-28: 1 via RECTAL
  Filled 2012-03-28: qty 1

## 2012-03-28 NOTE — Care Management Note (Signed)
    Page 1 of 1   03/28/2012     2:06:09 PM   CARE MANAGEMENT NOTE 03/28/2012  Patient:  John [name], John [name]   Account Number:  [id number]  Date Initiated:  03/28/2012  Documentation initiated by:  Lanier Clam  Subjective/Objective Assessment:   ADMITTED W/PROSTATE CA.     Action/Plan:   FROM HOME   Anticipated DC Date:  03/28/2012   Anticipated DC Plan:  HOME/SELF CARE      DC Planning Services  CM consult      Choice offered to / List presented to:             Status of service:  Completed, signed off Medicare Important Message given?   (If response is "NO", the following Medicare IM given date fields will be blank) Date Medicare IM given:   Date Additional Medicare IM given:    Discharge Disposition:  HOME/SELF CARE  Per UR Regulation:  Reviewed for med. necessity/level of care/duration of stay  If discussed at Long Length of Stay Meetings, dates discussed:    Comments:  03/28/12 Chrishawna Farina RN,BSN NCM [phone] S/P TURP.NO DC NEEDS, OR ORDERS.

## 2012-03-28 NOTE — Progress Notes (Signed)
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   MRN: [id number]  1 Day Post-Op Subjective: S/P TURP yesterday.  Urine is mostly clear on minimal CBI.  Objective: Vital signs in last 24 hours: Temp:  [97.3 F (36.3 C)-98.1 F (36.7 C)] 98 F (36.7 C) (02/14 0547) Pulse Rate:  [52-73] 59 (02/14 0547) Resp:  [8-20] 18 (02/14 0547) BP: (124-167)/(72-99) 135/79 mmHg (02/14 0547) SpO2:  [97 %-100 %] 100 % (02/14 0547)  Intake/Output from previous day: 02/13 0701 - 02/14 0700 In: 7300 [I.V.:900; IV Piggyback:100] Out: 78295 [Urine:18200; Emesis/NG output:300; Blood:50] Intake/Output this shift: Total I/O In: -  Out: 62130 [Urine:12650; Emesis/NG output:300]  Physical Exam:  General: Alert and oriented Abdomen: Soft, ND Urine: Mostly clear even when CBI is turned off Ext: NT, No erythema   Assessment/Plan: - Voiding trial in the hospital this morning and then he will be discharged later today.   LOS: 1 day   Gracianna Vink,LES 03/28/2012, 6:34 AM

## 2012-03-28 NOTE — Progress Notes (Signed)
Pt is having bladder spasms, gave BandO suppository. CBI is still draining cherry colored output.

## 2012-03-28 NOTE — Discharge Summary (Signed)
  Date of admission: 03/27/2012  Date of discharge: 03/28/2012  Admission diagnosis: Urinary retention, prostate cancer  Discharge diagnosis: Urinary retention, prostate cancer  Secondary diagnoses: Hypertension  History and Physical: For full details, please see admission history and physical. Briefly, John [name] is a 72 y.o. year old patient with urinary retention secondary to BPH.  He elected to undergo TURP for treatment.   Hospital Course: He underwent TURP on 03/27/12.  He was maintained on CBI overnight and his urine was clearing.  His catheter was removed on POD #1 and he underwent a voiding trial which he passed.  Disposition: Home  Discharge instruction: The patient was instructed to be ambulatory but told to refrain from heavy lifting, strenuous activity, or driving.  Discharge medications:    Medication List    STOP taking these medications       aspirin EC 81 MG tablet      TAKE these medications       docusate sodium 100 MG capsule  Commonly known as:  COLACE  Take 1 capsule (100 mg total) by mouth 2 (two) times daily.     doxazosin 4 MG tablet  Commonly known as:  CARDURA  Take 4 mg by mouth every morning. As directed     doxycycline 100 MG tablet  Commonly known as:  VIBRA-TABS  Take 1 tablet (100 mg total) by mouth 2 (two) times daily.     doxycycline 100 MG capsule  Commonly known as:  VIBRAMYCIN  Take 100 mg by mouth 2 (two) times daily.     latanoprost 0.005 % ophthalmic solution  Commonly known as:  XALATAN  Place 1 drop into both eyes at bedtime.     oxyCODONE-acetaminophen 5-325 MG per tablet  Commonly known as:  PERCOCET/ROXICET  Take 1 tablet by mouth every 8 (eight) hours as needed.        Followup:      Follow-up Information   Follow up with Troye Hiemstra,LES, MD. (04/23/12 at 12:30)    Contact information:   [address] [place] [address] [phone]

## 2012-03-28 NOTE — Progress Notes (Signed)
Foley traction released; existing gauze surrounding penis at foley entry saturated with blood; changed gauze.

## 2012-03-31 ENCOUNTER — Telehealth: Payer: Self-pay | Admitting: Pulmonary Disease

## 2012-03-31 NOTE — Telephone Encounter (Signed)
ATC Steward Drone, spoke with patient. Patient states Steward Drone was calling to give report on patients BP.  Patient unaware of BP at this time so will have wife return call.

## 2012-04-01 NOTE — Telephone Encounter (Signed)
Called, spoke with pt's wife.  Wife states she was supposed to call last Monday to update Leigh on pt's BP but pt was in the hospital for surgery.  States pt's SBP was 140-159 while in the hospital.  She was unsure of DBP readings.  Wife reports pt is taking doxazosin 4 mg qd now.  She has not been checking pt's bp since he has been home from hospital but will start doing this.  She would like any further recs.  Dr. Kriste Basque, pls advise.  Thank you.

## 2012-04-01 NOTE — Telephone Encounter (Signed)
lmomtcb x1 for pt 

## 2012-04-01 NOTE — Telephone Encounter (Signed)
Per SN--- No salt in his diet Continue meds Check BP's at home Follow up ov at next aval. Appt.  thanks

## 2012-04-02 NOTE — Telephone Encounter (Signed)
lmtcb x2 

## 2012-04-03 NOTE — Telephone Encounter (Signed)
Pt called back and he is aware of SN recs.  He stated that his BP readings have been ok at home recently. He will cont to monitor.  Pt stated that  He is still having some bleeding after his surgery but he will call and follow up with Dr. Laverle Patter for this. Nothing further is needed.

## 2012-04-03 NOTE — Telephone Encounter (Signed)
LMTCB

## 2012-04-23 DIAGNOSIS — R82998 Other abnormal findings in urine: Secondary | ICD-10-CM | POA: Diagnosis not present

## 2012-04-23 DIAGNOSIS — C61 Malignant neoplasm of prostate: Secondary | ICD-10-CM | POA: Diagnosis not present

## 2012-04-23 DIAGNOSIS — R339 Retention of urine, unspecified: Secondary | ICD-10-CM | POA: Diagnosis not present

## 2012-04-28 ENCOUNTER — Telehealth: Payer: Self-pay | Admitting: Pulmonary Disease

## 2012-04-28 MED ORDER — LOSARTAN POTASSIUM 100 MG PO TABS: 100.0000 mg | ORAL_TABLET | Freq: Every day | ORAL | Status: DC

## 2012-04-28 NOTE — Telephone Encounter (Signed)
Pt reports that Dr Laverle Patter wants Dr Kriste Basque to take him off Cardura now that he had prostate surgery in 03/2012 and give him something else for just b/p.  Readings have been 170/92, 155/95, 153/88.  Please advise

## 2012-04-28 NOTE — Telephone Encounter (Signed)
Per SN----ok to stop the cardura and start on losartan 100 mg  1 daily.  This new med has been sent to the pharmacy for the pt.  i called and lmomtcb for the pt to make him aware.

## 2012-04-29 NOTE — Telephone Encounter (Signed)
Called, spoke with pt.  Informed him of below per SN.  He verbalized understanding of this and is aware losartan sent to pharm.   Pt to call back if he has any further questions or concerns. Nothing further needed at this time.

## 2012-08-06 ENCOUNTER — Telehealth: Payer: Self-pay | Admitting: Pulmonary Disease

## 2012-08-06 NOTE — Telephone Encounter (Signed)
Spoke with pt's spouse and notified of recs per Dr Kriste Basque  She verbalized understanding and states nothing further needed Declined referral to [facility] at this time

## 2012-08-06 NOTE — Telephone Encounter (Signed)
lmomtcb  

## 2012-08-06 NOTE — Telephone Encounter (Signed)
Spoke with the pt's spouse She states that since BP med changed from cardura to losartan in March 2014 his mood has changed He is displaying a pessimistic attitude and is grumpy "all of the time" and this is not like him  She states that her children are even starting to notice this change in behavior His BP reading have been normal Spouse wants to know if the losartan can be what changes his mood and if we should change med Please advise thanks Allergies  Allergen Reactions  . Amlodipine Besylate Other (See Comments)    REACTION: causes nightmares  . Tamsulosin Other (See Comments)     dizziness

## 2012-08-06 NOTE — Telephone Encounter (Signed)
Per SN--  Losartan does not have mood alternating side effects.  It is helping his BP.  We can refer to Dr. Dellia Cloud.  thanks

## 2012-09-08 ENCOUNTER — Ambulatory Visit (INDEPENDENT_AMBULATORY_CARE_PROVIDER_SITE_OTHER): Payer: Medicare Other | Admitting: Pulmonary Disease

## 2012-09-08 ENCOUNTER — Encounter: Payer: Self-pay | Admitting: Pulmonary Disease

## 2012-09-08 VITALS — BP 146/82 | HR 63 | Temp 97.8°F | Ht 75.0 in | Wt 223.8 lb

## 2012-09-08 DIAGNOSIS — I10 Essential (primary) hypertension: Secondary | ICD-10-CM | POA: Diagnosis not present

## 2012-09-08 DIAGNOSIS — I4949 Other premature depolarization: Secondary | ICD-10-CM

## 2012-09-08 DIAGNOSIS — C61 Malignant neoplasm of prostate: Secondary | ICD-10-CM | POA: Diagnosis not present

## 2012-09-08 DIAGNOSIS — D573 Sickle-cell trait: Secondary | ICD-10-CM

## 2012-09-08 DIAGNOSIS — D126 Benign neoplasm of colon, unspecified: Secondary | ICD-10-CM

## 2012-09-08 DIAGNOSIS — K589 Irritable bowel syndrome without diarrhea: Secondary | ICD-10-CM

## 2012-09-08 DIAGNOSIS — N139 Obstructive and reflux uropathy, unspecified: Secondary | ICD-10-CM

## 2012-09-08 DIAGNOSIS — F411 Generalized anxiety disorder: Secondary | ICD-10-CM

## 2012-09-08 DIAGNOSIS — E7800 Pure hypercholesterolemia, unspecified: Secondary | ICD-10-CM

## 2012-09-08 NOTE — Progress Notes (Signed)
Subjective:    Patient ID: John [name], male    DOB: September 30, 1940, 72 y.o.   MRN: [id number]  HPI 72 y/o BM here for a follow up visit... he has multiple medical problems as noted below...   ~  he was prev followed Q47mo by the Urology division at [facility] for prostate cancer- bx 1/03 was + for sm volume prostate cancer, subseq bx 12/04 was neg, on active surveillance w/ PSA's in the 10-12 range and stable... last seen 9/09 & their note is reviewed... he's had some nocturia and freq- INTOL to Flomax & they have rec RAPAFLO but he hasn't tried it yet...  ~  December 20, 2010:  23mo ROV & wife states that the Doxazosin is causing mood swings & problems making decisions, forgetful> they are convinced it is the Cardura; he has nocturia x 2-3 but this is not particularly bothersome to the pt (I pointed out that sleep disruption could cause sleepiness, forgetfulness, mood swings but they are SURE it's the Cardura);  Offered to switch the med but they request just decr the dose to 1/2 tab daily- ok...  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   ~  August 28, 2011:  70mo ROV & Hosey's CC is swelling in his legs for the past 2 weeks; he has mild venous insuffic & has not been restricting sodium; we reviewed the need for NO SALT, elev legs, support hose, & start LASIX 20mg /d as needed for swelling...  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    HBP> on Cardura8mg - taking 1/2 tab daily; BP= 132/86 & they won't incr the dose due to "mood swings"; hopefully low sodium plus wt reduction will help...    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    Chol> they refuse meds, on diet alone, last LDL=126 & FLP today showed TChol 185, TG 47, HDL 48, LDL 128- he is content w/ these numbers...    GI> IBS, Hx colon polyp> last colon 5/12 w/ 2 polyps removed, stable- no recent  symptoms...    GU> known prostate cancer & BPH w/ LTOS followed by DrBorden on Cardura; last seen 2/13 7 note reviewed...    Anxiety> He has a generalized anxiety disorder, & exac by substitute teaching stress; he declines anxiolytic rx...    Prob AS hemoglobin> his son has sickle trait & apparently his wife was tested & normal, therefore Shep must be AS as well & we tried to confirm this w/ Hemoglobin electrophoresis but the blood was never drawn for this test... We reviewed prob list, meds, xrays and labs> see below>> LABS 7/13:  FLP- at goals x LDL=128 on diet alone;  Chems- ok;  CBC- ok w/ Hg=13.8 MCV=80 Plat=118;  TSH=1.16  ~  October 17, 2011:  70wk ROV & add-on after 2 ER visits> He & wife are incredibly poor historians & has special difficulty focusing on salient issues; review of all avail data from [facility], ER visits, etc indicate 2 interval problems:  Obstructive uropathy & constipation>>    Obstructive Uropathy> NEW PROB, hx has known prostate cancer on observation per DrBorden & actually saw DrBorden 10/12/11 in office  (note reviewed), known low vol prostate ca but last Bx was 6/11, hx BPH/LUTS intol Tamsulosin & treated w/ Cardura 8mg /d; he had elev PSA & UTI (EColi- sens Cipro, Sulfa- treated w/ Bacterim),  PVR=90cc... He had CT Abd 7/13 in ER for abd pain & fever (likely from UTI)==> incr bladder wall thickening, enlarged prostate, no adenopathy, mult incidental findings (left base Atx, calcif granuloma in spleen, bilat renal cysts, top-nl appendix at 7mm w/o inflamm)...  AFTER his Urology f/u w/ DrBorden 8/30 he returned to the ER 10/15/11 w/ abd pain & constipation (XRays have shown increased right colonic stool burden on mult exams); repeat CT Abd 9/2 showed marked increased prostate, distended bladder, dilated ureters bilat, hydronephrosis (plus 3 tiny calculi in bladder & renal cysts); NOTE> CREAT=1.8==> we called DrBorden's office & they will see him tomorrow for further eval & Rx...     Constipation> this is a recurrent prob that they have a hard time grasping the need for regular stool softener/ laxative meds; repeated XRays have shown an increased right sided stool burden; rec to take MIRALAX once or twice daily, and SENAKOT-S 1-2 at bedtime...    We reviewed prob list, meds, xrays and labs> see below for updates >> LABS 9/13:  CBC- Hg=13.1 WBC=8.7;  Chems- BS=160 BUN=17 Creat=1.8 (prev 1.4-1.7)   ~  September 08, 2012:  2mo ROV & after the last OV Hebert was checked by DrBorden & he had cysto/ needle bx 11/13, then TURP 2/14; f/u by Urology 3/14 prostate ca, BPH/LUTS, ED, etc; still on surveillence for his prostate ca & refuses other rx "I want to do the holistic treatments" he says... They called 3/14 w/ elev BP & they wanted to change off the Cardura; we switched to Losartan100 & BP improved; then they called w/ altered mood that wife thought was due to the new med; I rec counseling + same med; they declined counseling... We reviewed the following medical problems during today's office visit >>     HBP> on Losartan100; BP= 146/82 & he denies CP, palpit, dizzy, SOB, edema; they feel that all meds cause mood changes- asked to stay on the Losar100 & consider counseling w/ DrGutterman (they decline)...    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    Chol> they refuse meds, on diet alone, FLP today showed TChol 177, TG 39, HDL 48, LDL 122- he is content w/ these numbers...    GI> IBS, Hx colon polyp> last colon 5/12 w/ 2 polyps removed (one adenoma), stable- no recent symptoms, f/u due 5/17...    GU> known prostate cancer & BPH w/ LTOS followed by DrBorden; s/p TURP 2/14 & voiding is improved...    Anxiety> He has a generalized anxiety disorder, & exac by substitute teaching stress; he declines anxiolytic rx...    Prob AS hemoglobin> his son has sickle trait & apparently his wife was tested & normal, therefore Paiton must be AS as well & we  tried to confirm this w/ Hemoglobin electrophoresis but the blood was never drawn for this test... We reviewed prob list, meds, xrays and labs> see below for updates >>  LABS 7/14:  FLP- ok x LDL=122 on diet alone;  Chems- ok x Cr=1.4;  CBC- wnl;  TSH=0.84...           Problem List:  HYPERTENSION (ICD-401.9) - he started on DOXAZOSIN 8mg /d 11/11 for elev BP & prostate symptoms;  He was intol to Norvasc in the past c/o nightmares while on this med;  He prefers to control BP w/ herbs, diet, low sodium, etc... ~  3/11:  BP= 150/90 today & he denies HA, fatigue, visual  changes, CP, palipit, dizziness, syncope, dyspnea, edema, etc... we discussed 2DEcho to eval for LVH ==> finall done 11/11 & was WNL, no LVH etc... ~  11/11:  BP up at 150/100 today & we discussed diet, no salt, & start DOXAZOSIN 8mg /d. ~  5/12:  BP= 160/90 & he admits to not taking the Doxazosin regularly, asked to do so (so we can assess response)... ~  11/12:  BP= 148/80 & reminded to take the Doxazosin regularly but they want to decr the dose to 1/2 tab daily due to mood changes & forgetfulness that they are sure is coming from this med. ~  7/13:  BP= 132/86 & he denies CP, palpit, dizzy, SOB, etc... ~  9/13:  BP= 160/90 w/ his obstructive uropathy & pain; on Cardura8mg /d- encouraged to take it regularly which he hasn't been doing... ~  7/14:  They called 3/14 w/ elev BP & they wanted to change off the Cardura; we switched to Losartan100 & BP improved- BP= 146/82 7 he remains asymptomatic w/o CP, palpit, dizzy, SOB, edema...  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 Remote hx of PVC's w/ eval 1986 showing rare PVC's on holter monitor;  no cardiac symptoms; EKG w/ L axis, poor R prog V1-3, NAD.  Venous Insuffic & Edema >> He noted some LE swelling & we discussed the need for low sodium diet, elevation, support hose, & on LASIX 20mg /d as  needed...  HYPERCHOLESTEROLEMIA, MILD (ICD-272.0) - hx mild elevation of TChol & LDL in the past- he prefers to control w/ diet + exercise therapy and doesn't want statin med Rx...  ~  FLP 12/09 showed TChol 207, Tg 52, HDL 38, LDL 141... he prefers diet. ~  FLP 7/10 showed TChol 177, TG 50, HDL 40, LDL 127 ~  FLP 3/11 showed TChol 191, TG 64, HDL 47, LDL 131 ~  FLP 5/12 showed TChol 181, TG 40, HDL 47, LDL 126... He continues to refuse med rx. ~  FLP 7/13 on diet alone showed TChol 185, TG 47, HDL 48, LDL 128 ~  FLP 7/14 on diet alone showed TChol 177, TG 39, HDL 48, LDL 122     Hx of IRRITABLE BOWEL SYNDROME (ICD-564.1) - he denies abd pain, N/V, D/C, or blood seen... CONSTIPATION >> several abd films have shown increased right colon stool burden & he is encouraged to take MIRALAX & SENAKOT-S regularly...  ADENOMATOUS COLONIC POLYP (ICD-211.3)  ~  Colonoscopy by [facility] 3/08 w/ 10mm polyp in asc colon, removed w/ path= tubular adenoma;  repeat colon due 60yrs. ~  7/10: he denies nausea, vomiting, heartburn, diarrhea, constipation, blood in stool, abdominal pain, swelling, gas... ~  5/12: pt finally had his f/u colonoscopy> 2 polyps- one tubular adenoma w/ f/u suggested 89yrs...  Hx of HEMORRHOIDS (ICD-455.6) - Hx hemorroid surg by DrGerkin 10/03     PROSTATE CANCER (ICD-185) - Dx w/ Prostate Ca 1/03 by DrPeterson & offered surg vs seeds;  second opinion w/ DrDalstedt 2/03 w/ long discussion re: options;  discussed seed Rx w/ DrGoodchild 3/03;  pt preferred herbal Rx- and takes [employer] & Immune Enhancers;  4th opinion w/ Dr Sinclair Grooms @ [facility] 8/04 w/ repeat bx which was neg & decision made for "watchful waiting" w/ q58mo follow up in [facility]- last seen 9/09 and PSA was 10.3.Marland Kitchen. ~  7/10: pt informs me that he is no longer going to [employer] and wants a Emergency planning/management officer... refer  to DrBorden at [facility]... we did his PSA= 13.85... ~  3/11:  pt still on watchful waiting protocol per his  decision... PSA today = 18.36... refer back to DrBorden> note indicates that he wants to repeat bx & pt agreed> Bx done 6/11 & DrBorden's notes indicates it was pos for low vol Gleason6 prostate cancer & pt chose to continue surveillance; pt indicated "there was no cancer there". ~  1/12:  f/u note from DrBorden reviewed> continues on watchful waiting, tried Finasteride for BPH symptoms... ~  8/12:  f/u note from DrBorden reviewed> pt declined incr meds either incr dose of doxazosin or adding Finasteride, continue watchful waiting... ~  2/13:  f/u note from DrBorden reviewed> prostate ca, BPH/ LUTS, ED> PSA was 15.28 & pt wanted to continue surveillance... ~  7-9/13: 2 ER visits & f/u DrBorden> UTI w/ EColi & elev PSA- treated w/ Sulfa, developed obstructive uropathy as noted above==>  ~  2/14:  He had TURP by drBorden 7 voiding is better since then; Bx was pos for Gleason 3+3=6 adenocarcinoma & he prefers surveillance & holistic therapies...  Hx of GYNECOMASTIA (ICD-611.1) - Eval for L Gynecomastia 5/01 in [place]; FNA bx was neg... he has been taking an "immune enhancer" from DrNeilson in [place], [place] (pt asked to bring bottle in for [place] to review).     ANXIETY (ICD-300.00) - Alessander's underlying anxiety disorder is exac by substitute teaching now...  Question of HEMOGLOBINOPATHY> Brekken tells me his son has sickle trait & his wife was apparently tested & has normal hemoglobin- so Happy must have AS Hgb and we will order hemoglobin electrophoresis to check ==> this was ordered but never collected... ~  Labs 3/11 showed Hg= 14.0, MCV= 81, Plat= 147K ~  Labs 5/12 showed Hg= 14.6, MCV= 82, Plat= 131K ~  Labs 7/13 showed Hg= 13.8, MCV= 80, Plat= 118K ~  Labs 7/14 showed Hg= 13.6, Plat= 123K  Health Maint:  pt still taking supplemental "Immune Enhancer" - he states "I can tell a difference, I have more energy".   Past Surgical History  Procedure Laterality Date  . Colonoscopy    . Polypectomy     . Prostate biopsy  02/2001    repeat in 11/2002 Dr. Sinclair Grooms at [facility]  . Arm surgery  AGE 76  . Prostate biopsy  01/03/2012    Procedure: BIOPSY TRANSRECTAL ULTRASONIC PROSTATE (TUBP);  Surgeon: Crecencio Mc, MD;  Location: [facility];  Service: Urology;  Laterality: N/A;  45 MIN   . Cystoscopy  01/03/2012    Procedure: CYSTOSCOPY FLEXIBLE;  Surgeon: Crecencio Mc, MD;  Location: [facility];  Service: Urology;  Laterality: N/A;  . Transurethral resection of prostate N/A 03/27/2012    Procedure: TRANSURETHRAL RESECTION OF THE PROSTATE WITH GYRUS INSTRUMENTS;  Surgeon: Crecencio Mc, MD;  Location: [facility];  Service: Urology;  Laterality: N/A;  . Cystoscopy N/A 03/27/2012    Procedure: CYSTOSCOPY;  Surgeon: Crecencio Mc, MD;  Location: [facility];  Service: Urology;  Laterality: N/A;    Outpatient Encounter Prescriptions as of 09/08/2012  Medication Sig Dispense Refill  . docusate sodium (COLACE) 100 MG capsule Take 1 capsule (100 mg total) by mouth 2 (two) times daily.  30 capsule  0  . doxycycline (VIBRA-TABS) 100 MG tablet Take 1 tablet (100 mg total) by mouth 2 (two) times daily.  10 tablet  0  . doxycycline (VIBRAMYCIN) 100 MG capsule Take 100 mg by mouth 2 (two) times  daily.      . latanoprost (XALATAN) 0.005 % ophthalmic solution Place 1 drop into both eyes at bedtime.      Marland Kitchen losartan (COZAAR) 100 MG tablet Take 1 tablet (100 mg total) by mouth daily.  30 tablet  11  . oxyCODONE-acetaminophen (PERCOCET/ROXICET) 5-325 MG per tablet Take 1 tablet by mouth every 8 (eight) hours as needed.       No facility-administered encounter medications on file as of 09/08/2012.    Allergies  Allergen Reactions  . Amlodipine Besylate Other (See Comments)    REACTION: causes nightmares  . Tamsulosin Other (See Comments)     dizziness    Review of Systems        See HPI - all other systems neg except as noted... The patient denies anorexia, fever, weight loss, weight gain, vision  loss, decreased hearing, hoarseness, chest pain, syncope, dyspnea on exertion, peripheral edema, prolonged cough, headaches, hemoptysis, abdominal pain, melena, hematochezia, severe indigestion/heartburn, hematuria, incontinence, muscle weakness, suspicious skin lesions, transient blindness, difficulty walking, depression, unusual weight change, abnormal bleeding, enlarged lymph nodes, and angioedema.    Objective:   Physical Exam      WD, Overweight, 71 y/o BM in NAD... GENERAL:  Alert & oriented; pleasant & cooperative... HEENT:  [place]/AT, EOM-wnl, PERRLA, EACs-clear, TMs-wnl, NOSE-clear, THROAT-clear & wnl. NECK:  Supple w/ fairROM; no JVD; normal carotid impulses w/o bruits; no thyromegaly or nodules palpated; no lymphadenopathy. CHEST:  Clear to P & A; without wheezes/ rales/ or rhonchi. HEART:  Regular Rhythm; without murmurs/ rubs/ or gallops. ABDOMEN:  Soft & nontender; normal bowel sounds; no organomegaly or masses detected. Rectal exam is deferred... EXT: without deformities, mild arthritic changes; no varicose veins/ venous insuffic/ or edema. NEURO:  CN's intact; motor testing normal; sensory testing normal; gait normal & balance OK. DERM:  No lesions noted; no rash etc...  RADIOLOGY DATA:  Reviewed in the EPIC EMR & discussed w/ the patient...  LABORATORY DATA:  Reviewed in the EPIC EMR & discussed w/ the patient...   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  HBP>  BP is improved after switch to Cozaar100, continue same...  CHOL>  Remains on diet alone w/ fair numbers, he refuses med Rx...  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  Son has AS hemoglobin>  Pt wants test for sickle trait & we will order a hemoglobin electrophoresis at his request (test wasn't carried out by the lab)...   Patient's Medications  New Prescriptions   No medications on file  Previous Medications   LATANOPROST (XALATAN) 0.005 % OPHTHALMIC SOLUTION    Place 1 drop into both eyes at bedtime.   LOSARTAN (COZAAR) 100 MG TABLET    Take 1 tablet (100 mg total) by mouth daily.  Modified Medications   No medications on file  Discontinued Medications   DOCUSATE SODIUM (COLACE) 100 MG CAPSULE    Take 1 capsule (100 mg total) by mouth 2 (two) times daily.   DOXYCYCLINE (VIBRA-TABS) 100 MG TABLET    Take 1 tablet (100 mg total) by mouth 2 (two) times daily.   DOXYCYCLINE (  VIBRAMYCIN) 100 MG CAPSULE    Take 100 mg by mouth 2 (two) times daily.   OXYCODONE-ACETAMINOPHEN (PERCOCET/ROXICET) 5-325 MG PER TABLET    Take 1 tablet by mouth every 8 (eight) hours as needed.

## 2012-09-08 NOTE — Patient Instructions (Addendum)
Today we updated your med list in our EPIC system...    Continue your current medications the same...  Please return to our lab one morning this week for your FASTING blood work...    We will contact you w/ the results when available...   Call for any questions...  Let's plan a follow up visit in 1yr, sooner if needed for problems...   

## 2012-09-11 ENCOUNTER — Other Ambulatory Visit (INDEPENDENT_AMBULATORY_CARE_PROVIDER_SITE_OTHER): Payer: Medicare Other

## 2012-09-11 DIAGNOSIS — I10 Essential (primary) hypertension: Secondary | ICD-10-CM | POA: Diagnosis not present

## 2012-09-11 DIAGNOSIS — E7800 Pure hypercholesterolemia, unspecified: Secondary | ICD-10-CM | POA: Diagnosis not present

## 2012-09-11 DIAGNOSIS — D126 Benign neoplasm of colon, unspecified: Secondary | ICD-10-CM

## 2012-09-11 DIAGNOSIS — F411 Generalized anxiety disorder: Secondary | ICD-10-CM | POA: Diagnosis not present

## 2012-09-11 LAB — CBC WITH DIFFERENTIAL/PLATELET
Basophils Absolute: 0 10*3/uL (ref 0.0–0.1)
Lymphocytes Relative: 42.5 % (ref 12.0–46.0)
Lymphs Abs: 2.3 10*3/uL (ref 0.7–4.0)
Monocytes Relative: 6 % (ref 3.0–12.0)
Neutrophils Relative %: 49.2 % (ref 43.0–77.0)
Platelets: 123 10*3/uL — ABNORMAL LOW (ref 150.0–400.0)
RDW: 15.7 % — ABNORMAL HIGH (ref 11.5–14.6)

## 2012-09-11 LAB — BASIC METABOLIC PANEL
BUN: 19 mg/dL (ref 6–23)
CO2: 31 mEq/L (ref 19–32)
Glucose, Bld: 107 mg/dL — ABNORMAL HIGH (ref 70–99)
Potassium: 4.1 mEq/L (ref 3.5–5.1)
Sodium: 141 mEq/L (ref 135–145)

## 2012-09-11 LAB — HEPATIC FUNCTION PANEL
ALT: 15 U/L (ref 0–53)
AST: 20 U/L (ref 0–37)
Albumin: 3.7 g/dL (ref 3.5–5.2)
Alkaline Phosphatase: 70 U/L (ref 39–117)
Total Protein: 6.7 g/dL (ref 6.0–8.3)

## 2012-09-11 LAB — LIPID PANEL
Cholesterol: 177 mg/dL (ref 0–200)
HDL: 47.7 mg/dL (ref >39.00)

## 2012-09-11 LAB — TSH: TSH: 0.84 u[IU]/mL (ref 0.35–5.50)

## 2012-09-13 ENCOUNTER — Encounter: Payer: Self-pay | Admitting: Pulmonary Disease

## 2012-10-22 DIAGNOSIS — C61 Malignant neoplasm of prostate: Secondary | ICD-10-CM | POA: Diagnosis not present

## 2012-10-28 DIAGNOSIS — C61 Malignant neoplasm of prostate: Secondary | ICD-10-CM | POA: Diagnosis not present

## 2012-10-28 DIAGNOSIS — N400 Enlarged prostate without lower urinary tract symptoms: Secondary | ICD-10-CM | POA: Diagnosis not present

## 2012-12-01 ENCOUNTER — Telehealth: Payer: Self-pay | Admitting: Pulmonary Disease

## 2012-12-01 MED ORDER — AZITHROMYCIN 250 MG PO TABS: ORAL_TABLET | ORAL | Status: DC

## 2012-12-01 MED ORDER — PREDNISONE 5 MG PO KIT: PACK | ORAL | Status: DC

## 2012-12-01 NOTE — Telephone Encounter (Signed)
Called, spoke with pt's wife.  Reports pt has been coughing x 3 wks.  Cough is mostly nonprod.  Also has chest tightness, feels hot qhs, and has sweats qhs.  No wheezing or increased SOB.  Has tried tussin, coricidin, and hall's cough drops.  Started mucinex cough syrup yesterday.  Requesting further recs for cough and something inexpensive - [facility] [address]  Dr. Kriste Basque, pls advise.  Thank you.  Last OV with SN: 09/08/12; asked to f/u in 1 yr  Allergies verified with wife: Allergies  Allergen Reactions  . Amlodipine Besylate Other (See Comments)    REACTION: causes nightmares  . Tamsulosin Other (See Comments)     dizziness

## 2012-12-01 NOTE — Telephone Encounter (Signed)
Spouse aware of recs. rx has been sent

## 2012-12-01 NOTE — Telephone Encounter (Signed)
Per SN----  zpak #1  Take as directed pred dosepak #1  Take as directed  5 mg   6 day pack 

## 2013-05-14 ENCOUNTER — Other Ambulatory Visit: Payer: Self-pay | Admitting: Pulmonary Disease

## 2013-05-20 DIAGNOSIS — C61 Malignant neoplasm of prostate: Secondary | ICD-10-CM | POA: Diagnosis not present

## 2013-05-26 DIAGNOSIS — N400 Enlarged prostate without lower urinary tract symptoms: Secondary | ICD-10-CM | POA: Diagnosis not present

## 2013-09-08 ENCOUNTER — Ambulatory Visit: Payer: Medicare Other | Admitting: Physician Assistant

## 2013-09-08 ENCOUNTER — Other Ambulatory Visit (INDEPENDENT_AMBULATORY_CARE_PROVIDER_SITE_OTHER): Payer: Medicare Other

## 2013-09-08 ENCOUNTER — Encounter (INDEPENDENT_AMBULATORY_CARE_PROVIDER_SITE_OTHER): Payer: Self-pay

## 2013-09-08 ENCOUNTER — Ambulatory Visit (INDEPENDENT_AMBULATORY_CARE_PROVIDER_SITE_OTHER): Payer: Medicare Other | Admitting: Pulmonary Disease

## 2013-09-08 ENCOUNTER — Ambulatory Visit (INDEPENDENT_AMBULATORY_CARE_PROVIDER_SITE_OTHER)
Admission: RE | Admit: 2013-09-08 | Discharge: 2013-09-08 | Disposition: A | Discharge status: Home / Self Care | Payer: Medicare Other | Source: Ambulatory Visit | Attending: Pulmonary Disease | Admitting: Pulmonary Disease

## 2013-09-08 VITALS — BP 154/90 | HR 66 | Temp 97.9°F | Ht 75.0 in | Wt 226.0 lb

## 2013-09-08 DIAGNOSIS — E7800 Pure hypercholesterolemia, unspecified: Secondary | ICD-10-CM

## 2013-09-08 DIAGNOSIS — K589 Irritable bowel syndrome without diarrhea: Secondary | ICD-10-CM | POA: Diagnosis not present

## 2013-09-08 DIAGNOSIS — F419 Anxiety disorder, unspecified: Secondary | ICD-10-CM

## 2013-09-08 DIAGNOSIS — D126 Benign neoplasm of colon, unspecified: Secondary | ICD-10-CM | POA: Diagnosis not present

## 2013-09-08 DIAGNOSIS — F411 Generalized anxiety disorder: Secondary | ICD-10-CM

## 2013-09-08 DIAGNOSIS — N139 Obstructive and reflux uropathy, unspecified: Secondary | ICD-10-CM | POA: Diagnosis not present

## 2013-09-08 DIAGNOSIS — I10 Essential (primary) hypertension: Secondary | ICD-10-CM

## 2013-09-08 DIAGNOSIS — Z23 Encounter for immunization: Secondary | ICD-10-CM | POA: Diagnosis not present

## 2013-09-08 DIAGNOSIS — C61 Malignant neoplasm of prostate: Secondary | ICD-10-CM

## 2013-09-08 LAB — URINALYSIS
Bilirubin Urine: NEGATIVE
Hgb urine dipstick: NEGATIVE
KETONES UR: NEGATIVE
LEUKOCYTES UA: NEGATIVE
Nitrite: NEGATIVE
PH: 6 (ref 5.0–8.0)
SPECIFIC GRAVITY, URINE: 1.02 (ref 1.000–1.030)
UROBILINOGEN UA: 0.2 (ref 0.0–1.0)
Urine Glucose: NEGATIVE

## 2013-09-08 LAB — BASIC METABOLIC PANEL
BUN: 20 mg/dL (ref 6–23)
CALCIUM: 9.2 mg/dL (ref 8.4–10.5)
CO2: 29 mEq/L (ref 19–32)
Chloride: 107 mEq/L (ref 96–112)
Creatinine, Ser: 1.6 mg/dL — ABNORMAL HIGH (ref 0.4–1.5)
GFR: 55.54 mL/min — AB (ref >60.00)
GLUCOSE: 103 mg/dL — AB (ref 70–99)
POTASSIUM: 4 meq/L (ref 3.5–5.1)
SODIUM: 141 meq/L (ref 135–145)

## 2013-09-08 LAB — CBC WITH DIFFERENTIAL/PLATELET
BASOS PCT: 0.3 % (ref 0.0–3.0)
Basophils Absolute: 0 10*3/uL (ref 0.0–0.1)
EOS ABS: 0.1 10*3/uL (ref 0.0–0.7)
EOS PCT: 1.4 % (ref 0.0–5.0)
HCT: 41.7 % (ref 39.0–52.0)
HEMOGLOBIN: 14 g/dL (ref 13.0–17.0)
LYMPHS PCT: 41.4 % (ref 12.0–46.0)
Lymphs Abs: 2.2 10*3/uL (ref 0.7–4.0)
MCHC: 33.6 g/dL (ref 30.0–36.0)
MCV: 78.3 fl (ref 78.0–100.0)
MONOS PCT: 5.6 % (ref 3.0–12.0)
Monocytes Absolute: 0.3 10*3/uL (ref 0.1–1.0)
NEUTROS ABS: 2.7 10*3/uL (ref 1.4–7.7)
NEUTROS PCT: 51.3 % (ref 43.0–77.0)
Platelets: 136 10*3/uL — ABNORMAL LOW (ref 150.0–400.0)
RBC: 5.32 Mil/uL (ref 4.22–5.81)
RDW: 15.1 % (ref 11.5–15.5)
WBC: 5.2 10*3/uL (ref 4.0–10.5)

## 2013-09-08 LAB — HEPATIC FUNCTION PANEL
ALBUMIN: 3.9 g/dL (ref 3.5–5.2)
ALK PHOS: 75 U/L (ref 39–117)
ALT: 19 U/L (ref 0–53)
AST: 22 U/L (ref 0–37)
BILIRUBIN TOTAL: 0.7 mg/dL (ref 0.2–1.2)
Bilirubin, Direct: 0.1 mg/dL (ref 0.0–0.3)
Total Protein: 6.9 g/dL (ref 6.0–8.3)

## 2013-09-08 LAB — LIPID PANEL
CHOL/HDL RATIO: 4
Cholesterol: 188 mg/dL (ref 0–200)
HDL: 44.3 mg/dL (ref >39.00)
LDL CALC: 133 mg/dL — AB (ref 0–99)
NonHDL: 143.7
Triglycerides: 54 mg/dL (ref 0.0–149.0)
VLDL: 10.8 mg/dL (ref 0.0–40.0)

## 2013-09-08 LAB — TSH: TSH: 1.19 u[IU]/mL (ref 0.35–4.50)

## 2013-09-08 NOTE — Patient Instructions (Signed)
Today we updated your med list in our EPIC system...    Continue your current medications the same...    Try taking the Losartan in the AM & continue to monitor your BP at home...  Today we did your follow up CXR, FASTING blood work, & Urinalysis...    We will contact you w/ the results when available...   We gave you the combination Tetanus vaccine today called the TDAP (it is good for 10 yrs)...  Call for any questions...  Let's plan a follow up visit in 10yr, sooner if needed for problems.Marland KitchenMarland Kitchen

## 2013-09-13 ENCOUNTER — Encounter: Payer: Self-pay | Admitting: Pulmonary Disease

## 2013-09-13 NOTE — Progress Notes (Signed)
Subjective:    Patient ID: John [name], male    DOB: May 28, 1940, 73 y.o.   MRN: [id number]  HPI 73 y/o BM here for a follow up visit... he has multiple medical problems [name] noted below...   ~  [facility]- bx [facility] was + for sm volume prostate cancer, subseq bx 12/04 was neg, on active surveillance w/ PSA's in the 10-12 range and stable... last seen 9/09 & their note is reviewed... he's had some nocturia and freq- INTOL to Flomax & they have rec RAPAFLO but he hasn't tried it yet...  ~  December 20, 2010:  660moOV & wife states that the Doxazosin is causing mood swings & problems making decisions, forgetful> they are convinced it is the Cardura; he has nocturia x 2-3 but this is not particularly bothersome to the pt (I pointed out that sleep disruption could cause sleepiness, forgetfulness, mood swings but they are SURE it's the Cardura);  Offered to switch the med but they request just decr the dose to 1/2 tab daily- ok...  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   ~  August 28, 2011:  44m43moV & John [name]'s CC is swelling in his legs for the past 2 weeks; he has mild venous insuffic & has not been restricting sodium; we reviewed the need for NO SALT, elev legs, support hose, & start LASIX 70m34mas needed for swelling...  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    HBP> on Cardura44mg-35mking 1/2 tab daily; BP= 132/86 & they won't incr the dose due to "mood swings"; hopefully low sodium plus wt reduction will help...    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    Chol> they refuse meds, on diet alone, last LDL=126 & FLP today showed TChol 185, TG 47, HDL 48, LDL 128- he is content w/ these numbers...    GI> IBS, Hx colon polyp> last colon 5/12 w/ 2 polyps removed, stable- no recent  symptoms...    GU> known prostate cancer & BPH w/ LTOS followed by DrBorden on Cardura; last seen 2/13 7 note reviewed...    Anxiety> He has a generalized anxiety disorder, & exac by substitute teaching stress; he declines anxiolytic rx...    Prob [name] hemoglobin> his son has sickle trait & apparently his wife was tested & normal, therefore John [name] [name] well & we tried to confirm this w/ Hemoglobin electrophoresis but the blood was never drawn for this test... We reviewed prob list, meds, xrays and labs> see below>>  LABS 7/13:  FLP- at goals x LDL=128 on diet alone;  Chems- ok;  CBC- ok w/ Hg=13.8 MCV=80 Plat=118;  TSH=1.16  ~  October 17, 2011:  6wk ROV & add-on after 2 ER visits> He & wife are incredibly poor historians & has special difficulty focusing on salient issues; [facility] etc indicate 2 interval problems:  Obstructive uropathy & constipation>>    Obstructive Uropathy> NEW PROB, hx has known prostate cancer on observation per DrBorden & actually saw DrBorden 10/12/11 in office  (note reviewed), known low vol prostate ca but last Bx was 6/11, hx BPH/LUTS intol Tamsulosin & treated w/ Cardura 44mg/d;72m had elev PSA & UTI (EColi- sens Cipro, Sulfa- treated w/  Bacterim), PVR=90cc... He had CT Abd 7/13 in ER for abd pain & fever (likely from UTI)==> incr bladder wall thickening, enlarged prostate, no adenopathy, mult incidental findings (left base Atx, calcif granuloma in spleen, bilat renal cysts, top-nl appendix at 36m w/o inflamm)...  AFTER his Urology f/u w/ DrBorden 8/30 he returned to the ER 10/15/11 w/ abd pain & constipation (XRays have shown increased right colonic stool burden on mult exams); repeat CT Abd 9/2 showed marked increased prostate, distended bladder, dilated ureters bilat, hydronephrosis (plus 3 tiny calculi in bladder & renal cysts); NOTE> CREAT=1.8==> we called DrBorden's office & they will see him tomorrow for further eval & Rx...     Constipation> this is a recurrent prob that they have a hard time grasping the need for regular stool softener/ laxative meds; repeated XRays have shown an increased right sided stool burden; rec to take MIRALAX once or twice daily, and SENAKOT-S 1-2 at bedtime...    We reviewed prob list, meds, xrays and labs> see below for updates >>  LABS 9/13:  CBC- Hg=13.1 WBC=8.7;  Chems- BS=160 BUN=17 Creat=1.8 (prev 1.4-1.7)   ~  September 08, 2012:  17moOV & after the last OV JoCornelioas checked by DrBorden & he had cysto/ needle bx 11/13, then TURP 2/14; f/u by Urology 3/14 prostate ca, BPH/LUTS, ED, etc; still on surveillence for his prostate ca & refuses other rx "I want to do the holistic treatments" he says... They called 3/14 w/ elev BP & they wanted to change off the Cardura; we switched to Losartan100 & BP improved; then they called w/ altered mood that wife thought was due to the new med; I rec counseling + same med; they declined counseling... We reviewed the following medical problems during today's office visit >>     HBP> on Losartan100; BP= 146/82 & he denies CP, palpit, dizzy, SOB, edema; they feel that all meds cause mood changes- asked to stay on the Losar100 & consider counseling w/ DrGutterman (they decline)...    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    Chol> they refuse meds, on diet alone, FLP today showed TChol 177, TG 39, HDL 48, LDL 122- he is content w/ these numbers...    GI> IBS, Hx colon polyp> last colon 5/12 w/ 2 polyps removed (one adenoma), stable- no recent symptoms, f/u due 5/17...    GU> known prostate cancer & BPH w/ LTOS followed by DrBorden; s/p TURP 2/14 & voiding is improved...    Anxiety> He has a generalized anxiety disorder, & exac by substitute teaching stress; he declines anxiolytic rx...    Prob [name] hemoglobin> his son has sickle trait & apparently his wife was tested & normal, therefore John [name] [name] well &  we tried to confirm this w/ Hemoglobin electrophoresis but the blood was never drawn for this test... We reviewed prob list, meds, xrays and labs> see below for updates >>   LABS 7/14:  FLP- ok x LDL=122 on diet alone;  Chems- ok x Cr=1.4;  CBC- wnl;  TSH=0.84...   ~  September 08, 2013:  71yr58yr & John [name] night sweats that he thinks is coming from Losartan because he takes it at night (prev thought Cardura was causing nightmares and was therefore switched to Losartan); rather than change his BP med yet again we discussed changing the dosing time of his Cozaar to AM dosing... We reviewed the following  medical problems during today's office visit >>     Glaucoma> on eye drops from DrBrewington...    HBP> on Losartan100; BP= 154/90 & he denies CP, palpit, dizzy, SOB, edema; they feel that all meds cause mood changes (they declined counseling) & now night sweats- rec to try the Losar100 Qam + diet & exerc...    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    Chol> they refuse meds, on diet alone, FLP today showed TChol 188, TG 54, HDL 44, LDL 133- he is content w/ these numbers & doesn't want meds...    GI> IBS, Hx colon polyp> last colon 5/12 w/ 2 polyps removed (one adenoma), stable- no recent symptoms, f/u due 5/17...    GU> known prostate cancer & BPH w/ LTOS followed by DrBorden; s/p TURP 2/14 & voiding is improved; last seen 4/15> note reviewed, PSA stable ~12, continues on watchful waiting...    Renal Insuffic> Creat= 1.6 (his range over the last few yrs has been 1.3-1.8)    Anxiety> He has a generalized anxiety disorder, & exac by substitute teaching stress; he declines anxiolytic rx...    Prob [name] hemoglobin> his son has sickle trait & apparently his wife was tested & normal, therefore JRanniemust be [name] [name] well & we tried to confirm this w/ Hemoglobin electrophoresis but the blood was never drawn for this test... We reviewed prob list, meds, xrays  and labs> see below for updates >> we gave him the TDAP vaccination today...   CXR 7/15 showed norm heart size, clear lungs, NAD...  LABS 7/15:  FLP- ok x LDL=133;  Chems- ok x Cr=1.6;  CBC- wnl;  TSH=1.19;  UA- clear...           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   HYPERTENSION (ICD-401.9) - he started on DOXAZOSIN 854md 11/11 for elev BP & prostate symptoms;  He was intol to Norvasc in the past c/o nightmares while on this med;  He prefers to control BP w/ herbs, diet, low sodium, etc... ~  3/11:  BP= 150/90 today & he denies HA, fatigue, visual changes, CP, palipit, dizziness, syncope, dyspnea, edema, etc... we discussed 2DEcho to eval for LVH ==> finall done 11/11 & was WNL, no LVH etc... ~  11/11:  BP up at 150/100 today & we discussed diet, no salt, & start DOXAZOSIN 54m45m. ~  5/12:  BP= 160/90 & he admits to not taking the Doxazosin regularly, asked to do so (so we can assess response)... ~  11/12:  BP= 148/80 & reminded to take the Doxazosin regularly but they want to decr the dose to 1/2 tab daily due to mood changes & forgetfulness that they are sure is coming from this med. ~  7/13:  BP= 132/86 & he denies CP, palpit, dizzy, SOB, etc... ~  9/13:  BP= 160/90 w/ his obstructive uropathy & pain; on Cardura54mg21m encouraged to take it regularly which he hasn't been doing... ~  7/14:  They called 3/14 w/ elev BP & they wanted to change off the Cardura; we switched to Losartan100 & BP improved- BP= 146/82 7 he remains asymptomatic w/o CP, palpit, dizzy, SOB, edema... ~  7/15:  on Losartan100; BP= 154/90 & he denies CP, palpit, dizzy, SOB, edema; they feel that all meds cause mood changes (they declined counseling) & now night sweats- rec to  try the Losar100 Qam + diet & exerc. ~  CXR 7/15 showed norm heart size, clear lungs, NAD...  Hx of PREMATURE VENTRICULAR CONTRACTIONS (ICD-427.69), & DYSRHYTHMIA,  CARDIAC NOS (ICD-427.9) - on ASA 49m/d... Remote hx of PVC's w/ eval 1986 showing rare PVC's on holter monitor;  no cardiac symptoms; EKG w/ L axis, poor R prog V1-3, NAD.  Venous Insuffic & Edema >> He noted some LE swelling & we discussed the need for low sodium diet, elevation, support hose, & prev on LASIX 281md [name] needed...  HYPERCHOLESTEROLEMIA, MILD (ICD-272.0) - hx mild elevation of TChol & LDL in the past- he prefers to control w/ diet + exercise therapy and doesn't want statin med Rx...  ~  FLSc[place]2/09 showed TChol 207, Tg 52, HDL 38, LDL 141... he prefers diet. ~  FLP 7/10 showed TChol 177, TG 50, HDL 40, LDL 127 ~  FLP 3/11 showed TChol 191, TG 64, HDL 47, LDL 131 ~  FLP 5/12 showed TChol 181, TG 40, HDL 47, LDL 126... He continues to refuse med rx. ~  FLP 7/13 on diet alone showed TChol 185, TG 47, HDL 48, LDL 128 ~  FLP 7/14 on diet alone showed TChol 177, TG 39, HDL 48, LDL 122 ~  FLP 7/15 on diet alone showed TChol 188, TG 54, HDL 44, LDL 133- he is content w/ these numbers & doesn't want meds.     Hx of IRRITABLE BOWEL SYNDROME (ICD-564.1) - he denies abd pain, N/V, D/C, or blood seen... CONSTIPATION >> several abd films have shown increased right colon stool burden & he is encouraged to take MISouth Floral Parkegularly...  ADENOMATOUS COLONIC POLYP (ICD-211.3)  ~  Colon[facility] w/ 1063molyp in asc colon, removed w/ path= tubular adenoma;  repeat colon due 59yr18yr  7/10: he denies nausea, vomiting, heartburn, diarrhea, constipation, blood in stool, abdominal pain, swelling, gas... ~  5/12: pt finally had his f/u colonoscopy> 2 polyps- one tubular adenoma w/ f/u suggested 33yrs95yr Hx of HEMORRHOIDS (ICD-455.6) - Hx hemorroid surg by DrGerkin 10/03     PROSTATE CANCER (ICD-185) - Dx w/ Prostate Ca 1/03 by DrPeterson & offered surg vs seeds;  second opinion w/ DrDalstedt 2/03 w/ long discussion re: options;  discussed seed Rx w/ DrGoodchild 3/03;  pt preferred  herbal Rx- and took [facility] PMaryland Heightsh [facility]/ repeat bx which was neg & decision made for "watchful waiting" w/ [facility] and PSA was 10.3... ~  Marland Kitchen/10: [facility] that [facility]... we did his PSA= 13.85... ~  3/11:  pt still on watchful waiting protocol per his decision... PSA today = 18.36... refer back to DrBorden> note indicates that he wants to repeat bx & pt agreed> Bx done 6/11 & DrBorden's notes indicates it was pos for low vol Gleason 6 prostate cancer & pt chose to continue surveillance; pt indicated "there was no cancer there" but we discussed the diagnosis... ~  1/12:  f/u note from DrBorden reviewed> continues on watchful waiting, tried Finasteride for BPH symptoms... ~  8/12:  f/u note from DrBorden reviewed> pt declined incr meds either incr dose of doxazosin or adding Finasteride, continue watchful waiting... ~  2/13:  f/u note from DrBorden reviewed> prostate ca, BPH/ LUTS, ED> PSA was  15.28 & pt wanted to continue surveillance... ~  7-9/13: 2 ER visits & f/u DrBorden> UTI w/ EColi & elev PSA- treated w/ Sulfa, developed obstructive uropathy [name] noted above==> TURP (see Sept 4, 2013 above)... ~  2/14:  He had TURP by DrBorden & voiding is better since then; Bx was pos for Gleason 3+3=6 adenocarcinoma & he prefers surveillance & holistic therapies... ~  He had f/u visit DrBorden 4/15> known prostate cancer & BPH w/ LTOS; s/p TURP 2/14 & voiding is improved; PSA stable ~12, continues on watchful waiting...  Hx of GYNECOMASTIA (ICD-611.1) - Eval for L Gynecomastia 5/01 in [place]; FNA bx was neg... he has been taking an "immune enhancer" from [employer] in [place], [place] (pt asked to bring bottle in for [place] to review).     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  Question of HEMOGLOBINOPATHY> Gay tells me his son has sickle trait & his wife was apparently tested & has normal hemoglobin- so Meade must have [name] Hgb and we will order hemoglobin electrophoresis to check ==> this was ordered but never collected... ~  Labs 3/11 showed Hg= 14.0, MCV= 81, Plat= 147K ~  Labs 5/12 showed Hg= 14.6, MCV= 82, Plat= 131K ~  Labs 7/13 showed Hg= 13.8, MCV= 80, Plat= 118K ~  Labs 7/14 showed Hg= 13.6, Plat= 123K ~  Labs 7/15 showed Hg= 14.0, Plat= 136K...  Health Maint:  pt still taking supplemental "Immune Enhancer" - he states "I can tell a difference, I have more energy".   Past Surgical History  Procedure Laterality Date  . Colonoscopy    . Polypectomy    . Prostate biopsy  02/2001    repeat in 11/2002 Dr. [facility]  . Arm surgery  AGE 70  . Prostate biopsy  01/03/2012    Procedure: BIOPSY TRANSRECTAL ULTRASONIC PROSTATE (TUBP);  Surgeon: Dutch Gray, MD;  Location[facility];  Service: Urology;  Laterality: N/A;  45 MIN   . Cystoscopy  01/03/2012    Procedure: CYSTOSCOPY FLEXIBLE;  Surgeon: Dutch Gray, MD;  Location: [facility];  Service: Urology;  Laterality: N/A;  . Transurethral resection of prostate N/A 03/27/2012    Procedure: TRANSURETHRAL RESECTION OF THE PROSTATE WITH GYRUS INSTRUMENTS;  Surgeon: Dutch Gray, MD;  Location: [facility];  Service: Urology;  Laterality: N/A;  . Cystoscopy N/A 03/27/2012    Procedure: CYSTOSCOPY;  Surgeon: Dutch Gray, MD;  Location: [facility];  Service: Urology;  Laterality: N/A;    Outpatient Encounter Prescriptions [name] of 09/08/2013  Medication Sig  . latanoprost (XALATAN) 0.005 % ophthalmic solution Place 1 drop into both eyes at bedtime.  Marland Kitchen losartan (COZAAR) 100 MG tablet TAKE 1 TABLET (100 MG TOTAL) BY MOUTH DAILY.  . [DISCONTINUED] azithromycin (ZITHROMAX) 250 MG tablet Take [name] directed  . [DISCONTINUED] PredniSONE 5 MG KIT 6 day pack take [name] directed    Allergies  Allergen  Reactions  . Amlodipine Besylate Other (See Comments)    REACTION: causes nightmares  . Tamsulosin Other (See Comments)     dizziness    Review of Systems        See HPI - all other systems neg except [name] noted... The patient denies anorexia, fever, weight loss, weight gain, vision loss, decreased hearing, hoarseness, chest pain, syncope, dyspnea on exertion, peripheral edema, prolonged cough, headaches, hemoptysis, abdominal pain, melena, hematochezia, severe indigestion/heartburn, hematuria, incontinence, muscle weakness, suspicious skin lesions, transient blindness, difficulty  walking, depression, unusual weight change, abnormal bleeding, enlarged lymph nodes, and angioedema.    Objective:   Physical Exam      WD, Overweight, 73 y/o BM in NAD... GENERAL:  Alert & oriented; pleasant & cooperative... HEENT:  [place]/AT, EOM-wnl, PERRLA, EACs-clear, TMs-wnl, NOSE-clear, THROAT-clear & wnl. NECK:  Supple w/ fairROM; no JVD; normal carotid impulses w/o bruits; no thyromegaly or nodules palpated; no lymphadenopathy. CHEST:  Clear to P & A; without wheezes/ rales/ or rhonchi. HEART:  Regular Rhythm; without murmurs/ rubs/ or gallops. ABDOMEN:  Soft & nontender; normal bowel sounds; no organomegaly or masses detected. Rectal exam is deferred... EXT: without deformities, mild arthritic changes; no varicose veins/ venous insuffic/ or edema. NEURO:  CN's intact; motor testing normal; sensory testing normal; gait normal & balance OK. DERM:  No lesions noted; no rash etc...  RADIOLOGY DATA:  Reviewed in the EPIC EMR & discussed w/ the patient...  LABORATORY DATA:  Reviewed in the EPIC EMR & discussed w/ the patient...   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  HBP>  BP is fair on Cozaar100, continue same + diet & exercise...  CHOL>  Remains on diet alone w/  fair numbers, he refuses med Rx...  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  Son has [name] hemoglobin>  Pt wants test for sickle trait & we will order a hemoglobin electrophoresis at his request (test wasn't carried out by the lab)...   Patient's Medications  New Prescriptions   No medications on file  Previous Medications   LATANOPROST (XALATAN) 0.005 % OPHTHALMIC SOLUTION    Place 1 drop into both eyes at bedtime.   LOSARTAN (COZAAR) 100 MG TABLET    TAKE 1 TABLET (100 MG TOTAL) BY MOUTH DAILY.  Modified Medications   No medications on file  Discontinued Medications   AZITHROMYCIN (ZITHROMAX) 250 MG TABLET    Take [name] directed   PREDNISONE 5 MG KIT    6 day pack take [name] directed

## 2013-09-24 ENCOUNTER — Other Ambulatory Visit: Payer: Self-pay | Admitting: Pulmonary Disease

## 2013-12-07 DIAGNOSIS — C61 Malignant neoplasm of prostate: Secondary | ICD-10-CM | POA: Diagnosis not present

## 2013-12-11 DIAGNOSIS — N400 Enlarged prostate without lower urinary tract symptoms: Secondary | ICD-10-CM | POA: Diagnosis not present

## 2013-12-11 DIAGNOSIS — C61 Malignant neoplasm of prostate: Secondary | ICD-10-CM | POA: Diagnosis not present

## 2013-12-31 ENCOUNTER — Other Ambulatory Visit: Payer: Self-pay | Admitting: Pulmonary Disease

## 2014-02-22 ENCOUNTER — Telehealth: Payer: Self-pay | Admitting: Critical Care Medicine

## 2014-02-22 ENCOUNTER — Telehealth: Payer: Self-pay | Admitting: Pulmonary Disease

## 2014-02-22 NOTE — Telephone Encounter (Signed)
Received a page from answer svc but when i called the pt, the office had already reached this pt  Nothing further was needed

## 2014-02-22 NOTE — Telephone Encounter (Signed)
Called and spoke with pt and he stated that he was doing some stretching over the holidays and he pulled something in his leg.  He stated that this is still not better and he wanted to have this checked.  appt scheduled for tomorrow at 4 with SN.  Nothing further is needed.

## 2014-02-23 ENCOUNTER — Encounter: Payer: Self-pay | Admitting: Pulmonary Disease

## 2014-02-23 ENCOUNTER — Ambulatory Visit (INDEPENDENT_AMBULATORY_CARE_PROVIDER_SITE_OTHER): Payer: Medicare Other | Admitting: Pulmonary Disease

## 2014-02-23 VITALS — BP 152/94 | HR 75 | Temp 96.8°F | Ht 75.0 in | Wt 231.5 lb

## 2014-02-23 DIAGNOSIS — N139 Obstructive and reflux uropathy, unspecified: Secondary | ICD-10-CM

## 2014-02-23 DIAGNOSIS — I10 Essential (primary) hypertension: Secondary | ICD-10-CM | POA: Diagnosis not present

## 2014-02-23 DIAGNOSIS — C61 Malignant neoplasm of prostate: Secondary | ICD-10-CM | POA: Diagnosis not present

## 2014-02-23 DIAGNOSIS — M79605 Pain in left leg: Secondary | ICD-10-CM | POA: Diagnosis not present

## 2014-02-23 MED ORDER — METHOCARBAMOL 500 MG PO TABS: 500.0000 mg | ORAL_TABLET | Freq: Three times a day (TID) | ORAL | Status: DC

## 2014-02-23 MED ORDER — TRAMADOL HCL 50 MG PO TABS
50.0000 mg | ORAL_TABLET | Freq: Three times a day (TID) | ORAL | Status: DC | PRN
Start: 2014-02-23 — End: 2014-05-20

## 2014-02-23 MED ORDER — LATANOPROST 0.005 % OP SOLN: 1.0000 [drp] | Freq: Every day | OPHTHALMIC | Status: DC

## 2014-02-23 NOTE — Progress Notes (Signed)
Subjective:    Patient ID: John [name], male    DOB: 10/19/1940, 74 y.o.   MRN: [id number]  HPI 74 y/o BM here for a follow up visit... he has multiple medical problems as noted below...  ~  SEE PREV EPIC NOTES FOR OLDER DATA >>   ~  October 17, 2011:  6wk ROV & add-on after 2 ER visits> He & wife are incredibly poor historians & has special difficulty focusing on salient issues; review of all avail data from [facility], ER visits, etc indicate 2 interval problems:  Obstructive uropathy & constipation>>    Obstructive Uropathy> NEW PROB, hx has known prostate cancer on observation per DrBorden & actually saw DrBorden 10/12/11 in office  (note reviewed), known low vol prostate ca but last Bx was 6/11, hx BPH/LUTS intol Tamsulosin & treated w/ Cardura 8mg /d; he had elev PSA & UTI (EColi- sens Cipro, Sulfa- treated w/ Bacterim), PVR=90cc... He had CT Abd 7/13 in ER for abd pain & fever (likely from UTI)==> incr bladder wall thickening, enlarged prostate, no adenopathy, mult incidental findings (left base Atx, calcif granuloma in spleen, bilat renal cysts, top-nl appendix at 69mm w/o inflamm)...  AFTER his Urology f/u w/ DrBorden 8/30 he returned to the ER 10/15/11 w/ abd pain & constipation (XRays have shown increased right colonic stool burden on mult exams); repeat CT Abd 9/2 showed marked increased prostate, distended bladder, dilated ureters bilat, hydronephrosis (plus 3 tiny calculi in bladder & renal cysts); NOTE> CREAT=1.8==> we called DrBorden's office & they will see him tomorrow for further eval & Rx...    Constipation> this is a recurrent prob that they have a hard time grasping the need for regular stool softener/ laxative meds; repeated XRays have shown an increased right sided stool burden; rec to take MIRALAX once or twice daily, and SENAKOT-S 1-2 at bedtime...    We reviewed prob list, meds, xrays and labs> see below for updates >>  LABS 9/13:  CBC- Hg=13.1 WBC=8.7;  Chems- BS=160 BUN=17  Creat=1.8 (prev 1.4-1.7)   ~  September 08, 2012:  56mo ROV & after the last OV Chantz was checked by DrBorden & he had cysto/ needle bx 11/13, then TURP 2/14; f/u by Urology 3/14 prostate ca, BPH/LUTS, ED, etc; still on surveillence for his prostate ca & refuses other rx "I want to do the holistic treatments" he says... They called 3/14 w/ elev BP & they wanted to change off the Cardura; we switched to Losartan100 & BP improved; then they called w/ altered mood that wife thought was due to the new med; I rec counseling + same med; they declined counseling... We reviewed the following medical problems during today's office visit >>     HBP> on Losartan100; BP= 146/82 & he denies CP, palpit, dizzy, SOB, edema; they feel that all meds cause mood changes- asked to stay on the Losar100 & consider counseling w/ DrGutterman (they decline)...    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    Chol> they refuse meds, on diet alone, FLP today showed TChol 177, TG 39, HDL 48, LDL 122- he is content w/ these numbers...    GI> IBS, Hx colon polyp> last colon 5/12 w/ 2 polyps removed (one adenoma), stable- no recent symptoms, f/u due 5/17...    GU> known prostate cancer & BPH w/ LTOS followed by DrBorden; s/p TURP 2/14 & voiding is improved...    Anxiety> He has a  generalized anxiety disorder, & exac by substitute teaching stress; he declines anxiolytic rx...    Prob AS hemoglobin> his son has sickle trait & apparently his wife was tested & normal, therefore Nyheim must be AS as well & we tried to confirm this w/ Hemoglobin electrophoresis but the blood was never drawn for this test... We reviewed prob list, meds, xrays and labs> see below for updates >>   LABS 7/14:  FLP- ok x LDL=122 on diet alone;  Chems- ok x Cr=1.4;  CBC- wnl;  TSH=0.84...   ~  September 08, 2013:  5yr ROV & Leshaun is c/o night sweats that he thinks is coming from Losartan because he takes it at night (prev  thought Cardura was causing nightmares and was therefore switched to Losartan); rather than change his BP med yet again we discussed changing the dosing time of his Cozaar to AM dosing... We reviewed the following medical problems during today's office visit >>     Glaucoma> on eye drops from DrBrewington...    HBP> on Losartan100; BP= 154/90 & he denies CP, palpit, dizzy, SOB, edema; they feel that all meds cause mood changes (they declined counseling) & now night sweats- rec to try the Losar100 Qam + diet & exerc...    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    Chol> they refuse meds, on diet alone, FLP today showed TChol 188, TG 54, HDL 44, LDL 133- he is content w/ these numbers & doesn't want meds...    GI> IBS, Hx colon polyp> last colon 5/12 w/ 2 polyps removed (one adenoma), stable- no recent symptoms, f/u due 5/17...    GU> known prostate cancer & BPH w/ LTOS followed by DrBorden; s/p TURP 2/14 & voiding is improved; last seen 4/15> note reviewed, PSA stable ~12, continues on watchful waiting...    Renal Insuffic> Creat= 1.6 (his range over the last few yrs has been 1.3-1.8)    Anxiety> He has a generalized anxiety disorder, & exac by substitute teaching stress; he declines anxiolytic rx...    Prob AS hemoglobin> his son has sickle trait & apparently his wife was tested & normal, therefore Gayle must be AS as well & we tried to confirm this w/ Hemoglobin electrophoresis but the blood was never drawn for this test... We reviewed prob list, meds, xrays and labs> see below for updates >> we gave him the TDAP vaccination today...   CXR 7/15 showed norm heart size, clear lungs, NAD...  LABS 7/15:  FLP- ok x LDL=133;  Chems- ok x Cr=1.6;  CBC- wnl;  TSH=1.19;  UA- clear...   ~  February 23, 2014:  5-44mo ROV & add-on appt requested by pt> he states that he was doing some stretching exercises 1wk prior to Christmas & he subseq developed pain in  left leg, pain down the back of the leg to the calf; denies back pain or leg trauma or bruising/ swelling etc; he's taken some OTC Ibuprofen w/ some improvement; he notes that it hurts behind the left thigh if he sits too long, and it hurts in this area if he stands too long; no pain w/ ROM of knees, hips, etc; he does not have an Ortho specialist...  Exam shows weight up 6# to 232# today, BP= 150/90, otherw norm/neg, good ROM of legs/ neg SLR, tender on palp left hamstring region...  REC> rest, heat, and trial ROBAXIN500Tid & TRAMADOL50Tid w/ Tylenol; if pain persists  or worsens we will refer to SportsMed vs Rheum/Ortho for their opinion & recommendations...    BP treated w/ Losar100 monotherapy but he has gained wt to 232# (BMI~29) and not on diet or restricting sodium; advised to elim sodium, get wt down, take Losar100 daily (may need additional meds if BP remains elev...     Hx obstructive uropathy- BPH, prostate cancer, renal insuffic; s/p TURP 2/14 by DrBorden, they continue to monitor his PSA on watchful waiting protocol... 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   HYPERTENSION (ICD-401.9) - he started on DOXAZOSIN 8mg /d 11/11 for elev BP & prostate symptoms;  He was intol to Norvasc in the past c/o nightmares while on this med;  He prefers to control BP w/ herbs, diet, low sodium, etc... ~  3/11:  BP= 150/90 today & he denies HA, fatigue, visual changes, CP, palipit, dizziness, syncope, dyspnea, edema, etc... we discussed 2DEcho to eval for LVH ==> finall done 11/11 & was WNL, no LVH etc... ~  11/11:  BP up at 150/100 today & we discussed diet, no salt, & start DOXAZOSIN 8mg /d. ~  5/12:  BP= 160/90 & he admits to not taking the Doxazosin regularly, asked to do so (so we can assess response)... ~  11/12:  BP= 148/80 & reminded to take  the Doxazosin regularly but they want to decr the dose to 1/2 tab daily due to mood changes & forgetfulness that they are sure is coming from this med. ~  7/13:  BP= 132/86 & he denies CP, palpit, dizzy, SOB, etc... ~  9/13:  BP= 160/90 w/ his obstructive uropathy & pain; on Cardura8mg /d- encouraged to take it regularly which he hasn't been doing... ~  7/14:  They called 3/14 w/ elev BP & they wanted to change off the Cardura; we switched to Losartan100 & BP improved- BP= 146/82 7 he remains asymptomatic w/o CP, palpit, dizzy, SOB, edema... ~  7/15:  on Losartan100; BP= 154/90 & he denies CP, palpit, dizzy, SOB, edema; they feel that all meds cause mood changes (they declined counseling) & now night sweats- rec to try the Losar100 Qam + diet & exerc. ~  CXR 7/15 showed norm heart size, clear lungs, NAD... ~  1/16: BP treated w/ Losar100 monotherapy but he has gained wt to 232# (BMI~29) and not on diet or restricting sodium; advised to elim sodium, get wt down, take Losar100 daily (may need additional meds if BP remains elev  Hx of PREMATURE VENTRICULAR CONTRACTIONS (ICD-427.69), & DYSRHYTHMIA, CARDIAC NOS (ICD-427.9) - on ASA 81mg /d... Remote hx of PVC's w/ eval 1986 showing rare PVC's on holter monitor;  no cardiac symptoms; EKG w/ L axis, poor R prog V1-3, NAD.  Venous Insuffic & Edema >> He noted some LE swelling & we discussed the need for low sodium diet, elevation, support hose, & prev on LASIX 20mg /d as needed...  HYPERCHOLESTEROLEMIA, MILD (ICD-272.0) - hx mild elevation of TChol & LDL in the past- he prefers to control w/ diet + exercise therapy and doesn't want statin med Rx...  ~  [place] 12/09 showed TChol 207, Tg 52, HDL 38, LDL 141... he prefers diet. ~  FLP 7/10 showed TChol 177, TG 50, HDL 40, LDL 127 ~  FLP 3/11 showed TChol 191, TG 64, HDL  47, LDL 131 ~  FLP 5/12 showed TChol 181, TG 40, HDL 47, LDL 126... He continues to refuse med rx. ~  FLP 7/13 on diet alone showed TChol 185, TG  47, HDL 48, LDL 128 ~  FLP 7/14 on diet alone showed TChol 177, TG 39, HDL 48, LDL 122 ~  FLP 7/15 on diet alone showed TChol 188, TG 54, HDL 44, LDL 133- he is content w/ these numbers & doesn't want meds.     Hx of IRRITABLE BOWEL SYNDROME (ICD-564.1) - he denies abd pain, N/V, D/C, or blood seen... CONSTIPATION >> several abd films have shown increased right colon stool burden & he is encouraged to take Rhea regularly...  ADENOMATOUS COLONIC POLYP (ICD-211.3)  ~  Colonoscopy by [facility] 3/08 w/ 97mm polyp in asc colon, removed w/ path= tubular adenoma;  repeat colon due 80yrs. ~  7/10: he denies nausea, vomiting, heartburn, diarrhea, constipation, blood in stool, abdominal pain, swelling, gas... ~  5/12: pt finally had his f/u colonoscopy> 2 polyps- one tubular adenoma w/ f/u suggested 2yrs...  Hx of HEMORRHOIDS (ICD-455.6) - Hx hemorroid surg by DrGerkin 10/03     PROSTATE CANCER (ICD-185) - Dx w/ Prostate Ca 1/03 by DrPeterson & offered surg vs seeds;  second opinion w/ DrDalstedt 2/03 w/ long discussion re: options;  discussed seed Rx w/ DrGoodchild 3/03;  pt preferred herbal Rx- and took [place];  4th opinion w/ Dr Lottie Rater @ [facility] 8/04 w/ repeat bx which was neg & decision made for "watchful waiting" w/ q32mo follow up in [facility]- last seen 9/09 and PSA was 10.3.Marland Kitchen. ~  7/10: pt informs me that he is no longer going to [facility] and wants a Tax adviser... refer to DrBorden at [facility]... we did his PSA= 13.85... ~  3/11:  pt still on watchful waiting protocol per his decision... PSA today = 18.36... refer back to DrBorden> note indicates that he wants to repeat bx & pt agreed> Bx done 6/11 & DrBorden's notes indicates it was pos for low vol Gleason 6 prostate cancer & pt chose to continue surveillance; pt indicated "there was no cancer there" but we discussed the diagnosis... ~  1/12:  f/u note from DrBorden reviewed> continues on watchful  waiting, tried Finasteride for BPH symptoms... ~  8/12:  f/u note from DrBorden reviewed> pt declined incr meds either incr dose of doxazosin or adding Finasteride, continue watchful waiting... ~  2/13:  f/u note from DrBorden reviewed> prostate ca, BPH/ LUTS, ED> PSA was 15.28 & pt wanted to continue surveillance... ~  7-9/13: 2 ER visits & f/u DrBorden> UTI w/ EColi & elev PSA- treated w/ Sulfa, developed obstructive uropathy as noted above==> TURP (see Sept 4, 2013 above)... ~  2/14:  He had TURP by DrBorden & voiding is better since then; Bx was pos for Gleason 3+3=6 adenocarcinoma & he prefers surveillance & holistic therapies... ~  He had f/u visit DrBorden 4/15> known prostate cancer & BPH w/ LTOS; s/p TURP 2/14 & voiding is improved; PSA stable ~12, continues on watchful waiting... ~  1/16: Hx obstructive uropathy- BPH, prostate cancer, renal insuffic; s/p TURP 2/14 by DrBorden, they continue to monitor his PSA on watchful waiting protocol.   Hx of GYNECOMASTIA (ICD-611.1) - Eval for L Gynecomastia 5/01 in [place]; FNA bx was neg... he has been taking an "immune enhancer" from [facility] in [place], [place] (pt asked to bring bottle in for [place] to  review).     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  Question of HEMOGLOBINOPATHY> Millie tells me his son has sickle trait & his wife was apparently tested & has normal hemoglobin- so Lawrnce must have AS Hgb and we will order hemoglobin electrophoresis to check ==> this was ordered but never collected... ~  Labs 3/11 showed Hg= 14.0, MCV= 81, Plat= 147K ~  Labs 5/12 showed Hg= 14.6, MCV= 82, Plat= 131K ~  Labs 7/13 showed Hg= 13.8, MCV= 80, Plat= 118K ~  Labs 7/14 showed Hg= 13.6, Plat= 123K ~  Labs 7/15 showed Hg= 14.0, Plat= 136K...  Health Maint:  pt still taking supplemental "Immune Enhancer" - he states "I can tell a difference, I have more energy".   Past Surgical History  Procedure  Laterality Date  . Colonoscopy    . Polypectomy    . Prostate biopsy  02/2001    repeat in 11/2002 Dr. Lottie Rater at [facility]  . Arm surgery  AGE 78  . Prostate biopsy  01/03/2012    Procedure: BIOPSY TRANSRECTAL ULTRASONIC PROSTATE (TUBP);  Surgeon: Dutch Gray, MD;  Location: [facility];  Service: Urology;  Laterality: N/A;  45 MIN   . Cystoscopy  01/03/2012    Procedure: CYSTOSCOPY FLEXIBLE;  Surgeon: Dutch Gray, MD;  Location: [facility];  Service: Urology;  Laterality: N/A;  . Transurethral resection of prostate N/A 03/27/2012    Procedure: TRANSURETHRAL RESECTION OF THE PROSTATE WITH GYRUS INSTRUMENTS;  Surgeon: Dutch Gray, MD;  Location: [facility];  Service: Urology;  Laterality: N/A;  . Cystoscopy N/A 03/27/2012    Procedure: CYSTOSCOPY;  Surgeon: Dutch Gray, MD;  Location: [facility];  Service: Urology;  Laterality: N/A;    Outpatient Encounter Prescriptions as of 02/23/2014  Medication Sig  . latanoprost (XALATAN) 0.005 % ophthalmic solution Place 1 drop into both eyes at bedtime.  Marland Kitchen losartan (COZAAR) 100 MG tablet TAKE 1 TABLET (100 MG TOTAL) BY MOUTH DAILY.  . [DISCONTINUED] losartan (COZAAR) 100 MG tablet TAKE 1 TABLET BY MOUTH DAILY (Patient not taking: Reported on 02/23/2014)    Allergies  Allergen Reactions  . Amlodipine Besylate Other (See Comments)    REACTION: causes nightmares  . Tamsulosin Other (See Comments)     dizziness    Review of Systems        See HPI - all other systems neg except as noted... The patient denies anorexia, fever, weight loss, weight gain, vision loss, decreased hearing, hoarseness, chest pain, syncope, dyspnea on exertion, peripheral edema, prolonged cough, headaches, hemoptysis, abdominal pain, melena, hematochezia, severe indigestion/heartburn, hematuria, incontinence, muscle weakness, suspicious skin lesions, transient blindness, difficulty walking, depression, unusual weight change, abnormal bleeding, enlarged lymph nodes,  and angioedema.    Objective:   Physical Exam      WD, Overweight, 74 y/o BM in NAD... GENERAL:  Alert & oriented; pleasant & cooperative... HEENT:  [place]/AT, EOM-wnl, PERRLA, EACs-clear, TMs-wnl, NOSE-clear, THROAT-clear & wnl. NECK:  Supple w/ fairROM; no JVD; normal carotid impulses w/o bruits; no thyromegaly or nodules palpated; no lymphadenopathy. CHEST:  Clear to P & A; without wheezes/ rales/ or rhonchi. HEART:  Regular Rhythm; without murmurs/ rubs/ or gallops. ABDOMEN:  Soft & nontender; normal bowel sounds; no organomegaly or masses detected. Rectal exam is deferred... EXT: without deformities, mild arthritic changes noted; no varicose veins/ venous insuffic/ or edema. Left leg w/ god ROM, neg SLR, mildly tender to palp hamstring area... NEURO:  CN's  intact; motor testing normal; sensory testing normal; gait normal & balance OK. DERM:  No lesions noted; no rash etc...  RADIOLOGY DATA:  Reviewed in the EPIC EMR & discussed w/ the patient...  LABORATORY DATA:  Reviewed in the EPIC EMR & discussed w/ the patient...   Assessment & Plan:    LEFT LEG PAIN, prob pulled muscle left hamstring>> Rec rest, heat, and trial ROBAXIN500Tid & TRAMADOL50Tid w/ Tylenol; if pain persists or worsens we will refer to SportsMed vs Rheum/Ortho for their opinion & recommendations...   PROSTATE CANCER, BPH/ LUTS, Obstructive Uropathy due to prostate, Creat=1.6>  As noted above, s/p TURP 2/14 and improved from LUTS; followed by DrBorden...  HBP>  BP is fair on Cozaar100, continue same + diet & exercise...  CHOL>  Remains on diet alone w/ fair numbers, he refuses med Rx...  GI>  IBS, Constipation, Polyps, hx Hems>  Followed by DrJacobs- s/p colonoscopy w/ removal of adenomaous polyp 5/12 & repeat suggested 64yrs; he has chr constip vs IBS-C and rec to take regular laxative meds w/ MIRALAX & SENAKOT-S (if not effective, may need Amitiza vs Linzess).  Son has AS hemoglobin>  Pt wants test for sickle  trait & we will order a hemoglobin electrophoresis at his request (test wasn't carried out by the lab)...   Patient's Medications  New Prescriptions   METHOCARBAMOL (ROBAXIN) 500 MG TABLET    Take 1 tablet (500 mg total) by mouth 3 (three) times daily.   TRAMADOL (ULTRAM) 50 MG TABLET    Take 1 tablet (50 mg total) by mouth 3 (three) times daily as needed for moderate pain.  Previous Medications   LOSARTAN (COZAAR) 100 MG TABLET    TAKE 1 TABLET (100 MG TOTAL) BY MOUTH DAILY.  Modified Medications   Modified Medication Previous Medication   LATANOPROST (XALATAN) 0.005 % OPHTHALMIC SOLUTION latanoprost (XALATAN) 0.005 % ophthalmic solution      Place 1 drop into both eyes at bedtime.    Place 1 drop into both eyes at bedtime.  Discontinued Medications   LOSARTAN (COZAAR) 100 MG TABLET    TAKE 1 TABLET BY MOUTH DAILY

## 2014-02-23 NOTE — Patient Instructions (Signed)
Today we updated your med list in our EPIC system...    Continue your current medications the same...  For your left leg >>    Rest the leg & elevate when able...    Apply heat- hot towel & heating pad several times daily...    Use the ROBAXIN 500mg  muscle relaxer- one tab up to 3 times daily as needed...    Use the TRAMADOL 50mg  pain pill- one tab up to 3 times daily as needed for pain       (and you may add an extra strength TYLENOL if needed to boost it's benefit)...  If the discomfort is not resolving, let me know for a referral to a [facility] specialist for their review...  Call for any questions.Marland KitchenMarland Kitchen

## 2014-02-25 DIAGNOSIS — H40233 Intermittent angle-closure glaucoma, bilateral: Secondary | ICD-10-CM | POA: Diagnosis not present

## 2014-03-14 ENCOUNTER — Other Ambulatory Visit: Payer: Self-pay | Admitting: Pulmonary Disease

## 2014-04-10 ENCOUNTER — Telehealth: Payer: Self-pay | Admitting: Pulmonary Disease

## 2014-04-10 NOTE — Telephone Encounter (Signed)
Patient notes 3 day history of chest congestion and cough that is now improving, but has head and nasal congestion now.   +mucus but not overly purulent.  No fever, some subjective sweats.  No increased sob. Recommended tylenol cold and sinus thru w/e with increased fluids.  If worsening or if no better by Monday, he is to call us back for abx.

## 2014-04-29 ENCOUNTER — Other Ambulatory Visit: Payer: Self-pay | Admitting: Pulmonary Disease

## 2014-04-29 MED ORDER — LOSARTAN POTASSIUM 100 MG PO TABS: ORAL_TABLET | ORAL | Status: DC

## 2014-05-11 ENCOUNTER — Other Ambulatory Visit: Payer: Self-pay | Admitting: Urology

## 2014-05-12 ENCOUNTER — Other Ambulatory Visit: Payer: Self-pay | Admitting: Urology

## 2014-05-12 DIAGNOSIS — C61 Malignant neoplasm of prostate: Secondary | ICD-10-CM | POA: Diagnosis not present

## 2014-05-19 NOTE — Patient Instructions (Signed)
Prince T Champlain  05/19/2014   Your procedure is scheduled on:  05/27/2014    Report to [facility] Main  Entrance and follow signs to               [place] at      0700 AM.  Call this number if you have problems the morning of surgery [phone]   Remember:  Do not eat food or drink liquids :After Midnight.     Take these medicines the morning of surgery with A SIP OF WATER: Zyrtec, Zaditor eye drops if needed                                You may not have any metal on your body including hair pins and              piercings  Do not wear jewelry, , lotions, powders or perfumes., deodorant.                            Men may shave face and neck.   Do not bring valuables to the hospital. [place].  Contacts, dentures or bridgework may not be worn into surgery.  .     Patients discharged the day of surgery will not be allowed to drive home.  Name and phone number of your driver:  Special Instructions:coughing and deep breathing exercises, leg exercises               Please read over the following fact sheets you were given: _____________________________________________________________________             [facility] - Preparing for Surgery Before surgery, you can play an important role.  Because skin is not sterile, your skin needs to be as free of germs as possible.  You can reduce the number of germs on your skin by washing with CHG (chlorahexidine gluconate) soap before surgery.  CHG is an antiseptic cleaner which kills germs and bonds with the skin to continue killing germs even after washing. Please DO NOT use if you have an allergy to CHG or antibacterial soaps.  If your skin becomes reddened/irritated stop using the CHG and inform your nurse when you arrive at Short Stay. Do not shave (including legs and underarms) for at least 48 hours prior to the first CHG shower.  You may shave your  face/neck. Please follow these instructions carefully:  1.  Shower with CHG Soap the night before surgery and the  morning of Surgery.  2.  If you choose to wash your hair, wash your hair first as usual with your  normal  shampoo.  3.  After you shampoo, rinse your hair and body thoroughly to remove the  shampoo.                           4.  Use CHG as you would any other liquid soap.  You can apply chg directly  to the skin and wash                       Gently with a scrungie or clean washcloth.  5.  Apply the CHG Soap to your body ONLY FROM THE NECK DOWN.   Do not use on face/ open                           Wound or open sores. Avoid contact with eyes, ears mouth and genitals (private parts).                       Wash face,  Genitals (private parts) with your normal soap.             6.  Wash thoroughly, paying special attention to the area where your surgery  will be performed.  7.  Thoroughly rinse your body with warm water from the neck down.  8.  DO NOT shower/wash with your normal soap after using and rinsing off  the CHG Soap.                9.  Pat yourself dry with a clean towel.            10.  Wear clean pajamas.            11.  Place clean sheets on your bed the night of your first shower and do not  sleep with pets. Day of Surgery : Do not apply any lotions/deodorants the morning of surgery.  Please wear clean clothes to the hospital/surgery center.  FAILURE TO FOLLOW THESE INSTRUCTIONS MAY RESULT IN THE CANCELLATION OF YOUR SURGERY PATIENT SIGNATURE_________________________________  NURSE SIGNATURE__________________________________  ________________________________________________________________________

## 2014-05-20 ENCOUNTER — Encounter (HOSPITAL_COMMUNITY): Payer: Self-pay

## 2014-05-20 ENCOUNTER — Encounter (HOSPITAL_COMMUNITY)
Admission: RE | Admit: 2014-05-20 | Discharge: 2014-05-20 | Disposition: A | Discharge status: Home / Self Care | Payer: Medicare Other | Source: Ambulatory Visit | Attending: Urology | Admitting: Urology

## 2014-05-20 DIAGNOSIS — R310 Gross hematuria: Secondary | ICD-10-CM | POA: Diagnosis not present

## 2014-05-20 DIAGNOSIS — Z01810 Encounter for preprocedural cardiovascular examination: Secondary | ICD-10-CM | POA: Insufficient documentation

## 2014-05-20 DIAGNOSIS — Z01812 Encounter for preprocedural laboratory examination: Secondary | ICD-10-CM | POA: Diagnosis not present

## 2014-05-20 DIAGNOSIS — N400 Enlarged prostate without lower urinary tract symptoms: Secondary | ICD-10-CM | POA: Diagnosis not present

## 2014-05-20 DIAGNOSIS — N281 Cyst of kidney, acquired: Secondary | ICD-10-CM | POA: Diagnosis not present

## 2014-05-20 LAB — BASIC METABOLIC PANEL
Anion gap: 4 — ABNORMAL LOW (ref 5–15)
BUN: 23 mg/dL (ref 6–23)
CALCIUM: 9.1 mg/dL (ref 8.4–10.5)
CO2: 31 mmol/L (ref 19–32)
CREATININE: 1.46 mg/dL — AB (ref 0.50–1.35)
Chloride: 106 mmol/L (ref 96–112)
GFR calc Af Amer: 54 mL/min — ABNORMAL LOW (ref >90)
GFR, EST NON AFRICAN AMERICAN: 47 mL/min — AB (ref >90)
GLUCOSE: 102 mg/dL — AB (ref 70–99)
Potassium: 4 mmol/L (ref 3.5–5.1)
Sodium: 141 mmol/L (ref 135–145)

## 2014-05-20 LAB — CBC
HCT: 40.6 % (ref 39.0–52.0)
Hemoglobin: 13.7 g/dL (ref 13.0–17.0)
MCH: 26.5 pg (ref 26.0–34.0)
MCHC: 33.7 g/dL (ref 30.0–36.0)
MCV: 78.5 fL (ref 78.0–100.0)
PLATELETS: 127 10*3/uL — AB (ref 150–400)
RBC: 5.17 MIL/uL (ref 4.22–5.81)
RDW: 14.9 % (ref 11.5–15.5)
WBC: 5.4 10*3/uL (ref 4.0–10.5)

## 2014-05-20 NOTE — Progress Notes (Signed)
CXR- 09/08/2013 EPIC  02/23/2014- LOV with Pulmonology in EPIC.

## 2014-05-20 NOTE — Progress Notes (Signed)
CBC and BMp results done 05/20/2014 routed via EPIC to Dr Alinda Money.

## 2014-05-20 NOTE — Progress Notes (Addendum)
Anesthesia has seen final EKg done 05/20/2014 and last EKG in EPIC dated 2013.  Anesthesia requested that patient needs to be seen by a cardiologist  Prior to surgery.  Called Noemi Chapel  ( Surgery Scheduler ) for Dr Alinda Money and left her a message regarding this.  Also spoke with nurse of Dr Alinda Money, Nira Conn,  and made her aware that due to abnormal EKG done 05/20/2014 in EP[facility] Anesthesia  ( anesthesia seen EKG )  stated that patient needed to be seen by a cardiologist prior to surgery.  Nira Conn stated she would let Dr Alinda Money be aware.  I told her that I had also left a message for Noemi Chapel on her voice mail

## 2014-05-26 ENCOUNTER — Telehealth: Payer: Self-pay | Admitting: Internal Medicine

## 2014-05-26 NOTE — Telephone Encounter (Signed)
Received records from [facility] for appointment with Dr Debara Pickett on 07/02/14.  Records given to [facility] (medical records) for Dr Lysbeth Penner schedule on 07/02/14.

## 2014-05-27 ENCOUNTER — Encounter (HOSPITAL_COMMUNITY): Admission: RE | Payer: Self-pay | Source: Ambulatory Visit

## 2014-05-27 ENCOUNTER — Ambulatory Visit (HOSPITAL_COMMUNITY): Admission: RE | Admit: 2014-05-27 | Payer: Medicare Other | Source: Ambulatory Visit | Admitting: Urology

## 2014-05-27 SURGERY — BIOPSY, PROSTATE, RECTAL APPROACH, WITH US GUIDANCE
Anesthesia: Choice

## 2014-06-16 ENCOUNTER — Telehealth: Payer: Self-pay | Admitting: Pulmonary Disease

## 2014-06-16 NOTE — Telephone Encounter (Signed)
Nothing further needed 

## 2014-06-18 DIAGNOSIS — H25011 Cortical age-related cataract, right eye: Secondary | ICD-10-CM | POA: Diagnosis not present

## 2014-06-18 DIAGNOSIS — H40033 Anatomical narrow angle, bilateral: Secondary | ICD-10-CM | POA: Diagnosis not present

## 2014-06-18 DIAGNOSIS — H47233 Glaucomatous optic atrophy, bilateral: Secondary | ICD-10-CM | POA: Diagnosis not present

## 2014-06-18 DIAGNOSIS — H4011X3 Primary open-angle glaucoma, severe stage: Secondary | ICD-10-CM | POA: Diagnosis not present

## 2014-07-02 ENCOUNTER — Ambulatory Visit: Payer: Medicare Other | Admitting: Internal Medicine

## 2014-07-28 DIAGNOSIS — H4011X3 Primary open-angle glaucoma, severe stage: Secondary | ICD-10-CM | POA: Diagnosis not present

## 2014-07-28 DIAGNOSIS — H40033 Anatomical narrow angle, bilateral: Secondary | ICD-10-CM | POA: Diagnosis not present

## 2014-08-20 DIAGNOSIS — H4011X3 Primary open-angle glaucoma, severe stage: Secondary | ICD-10-CM | POA: Diagnosis not present

## 2014-08-25 ENCOUNTER — Encounter: Payer: Self-pay | Admitting: Internal Medicine

## 2014-08-25 ENCOUNTER — Ambulatory Visit (INDEPENDENT_AMBULATORY_CARE_PROVIDER_SITE_OTHER): Payer: Medicare Other | Admitting: Internal Medicine

## 2014-08-25 VITALS — BP 160/90 | HR 68 | Ht 75.0 in | Wt 231.1 lb

## 2014-08-25 DIAGNOSIS — Z01810 Encounter for preprocedural cardiovascular examination: Secondary | ICD-10-CM

## 2014-08-25 DIAGNOSIS — R9431 Abnormal electrocardiogram [ECG] [EKG]: Secondary | ICD-10-CM

## 2014-08-25 DIAGNOSIS — C61 Malignant neoplasm of prostate: Secondary | ICD-10-CM

## 2014-08-25 NOTE — Patient Instructions (Signed)
Your physician recommends that you schedule a follow-up appointment in: as needed with Dr. Debara Pickett

## 2014-08-25 NOTE — Progress Notes (Signed)
OFFICE NOTE  Chief Complaint:  Preoperative cardiac risk evaluation, abnormal EKG  Primary Care Physician: Noralee Space, MD  HPI:  John [name] is a pleasant 74 year old male with a history of prostate cancer. He underwent treatment and is currently undergoing surveillance. He has been scheduled for a prostate biopsy which will be performed under general anesthesia. In April of this year he underwent preoperative assessment which demonstrate an abnormal EKG. 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 We repeated the EKG in the office today which shows a sinus rhythm and left anterior fascicular block however the T-wave inversions have improved. I queried him about his symptoms and he reports being very active. 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 He denies any chest pain or shortness of breath and can easily do at least 4 METS of activity without limitation. There is a heart history in his father however he was a smoker and diabetic. He has a history of hypertension but it has been well controlled. Blood pressure is elevated today however at home he says his blood pressures are well controlled. There is also an anxiety history.  PMHx:  Past Medical History  Diagnosis Date  . Anxiety   . Premature ventricular contractions   . Hypercholesteremia   . Hx of adenomatous colonic polyps   . Hemorrhoids   . Gynecomastia   . Urinary retention   . Hypertension NO MED--  WAS TAKES CADURA FOR  BOTH BP AND PROSTATE ---  WAS TOLD TO STOP BY DR [name]  PER WIFE  . Prostate cancer INITIAL DX STAGE T1c Nx Mx  ADENOCARCINOMA JAN 2003 (PT DECIDED TO PROCEED W/ SURVEILLANCE)--  FOLLOWED BY DR LES [name] , UROLOGIST    PER PT AND WIFE ---  healed with holistic meds--  BY  [facility]  MD UNTIL  RECENT SYMPTOMS OF URINARY RETENTION  . Hyperplasia of prostate with urinary obstruction   . Glaucoma     Past Surgical History  Procedure Laterality Date  . Colonoscopy    . Polypectomy    . Prostate biopsy  02/2001    repeat in 11/2002 Dr. Lottie Rater at [facility]  . Arm surgery  AGE 63    patient denies at preop appt of 05/20/2014    . Prostate biopsy  01/03/2012    Procedure: BIOPSY TRANSRECTAL ULTRASONIC PROSTATE (TUBP);  Surgeon: Dutch Gray, MD;  Location: [facility];  Service: Urology;  Laterality: N/A;  45 MIN   . Cystoscopy  01/03/2012    Procedure: CYSTOSCOPY FLEXIBLE;  Surgeon: Dutch Gray, MD;  Location: [facility];  Service: Urology;  Laterality: N/A;  . Transurethral resection of prostate N/A 03/27/2012    Procedure: TRANSURETHRAL RESECTION OF THE PROSTATE WITH GYRUS INSTRUMENTS;  Surgeon: Dutch Gray, MD;  Location: [facility];  Service: Urology;  Laterality: N/A;  . Cystoscopy N/A 03/27/2012    Procedure: CYSTOSCOPY;  Surgeon: Dutch Gray, MD;  Location: [facility];  Service: Urology;  Laterality: N/A;    FAMHx:  Family History  Problem Relation Age of Onset  . Heart disease Father   . Diabetes Father     SOCHx:   reports that he quit smoking about 45 years ago. His smoking use included Cigarettes. He  has a 7.5 pack-year smoking history. He has never used smokeless tobacco. He reports that he does not drink alcohol or use illicit drugs.  ALLERGIES:  Allergies  Allergen Reactions  . Amlodipine Besylate Other (See Comments)    REACTION: causes nightmares  . Tamsulosin Other (See Comments)     dizziness    ROS: A comprehensive review of systems was negative.  HOME MEDS: Current Outpatient Prescriptions  Medication Sig Dispense Refill  . aspirin EC 81 MG tablet Take 81 mg by mouth daily.    . bimatoprost (LUMIGAN) 0.01 % SOLN Place 1 drop into both eyes at bedtime.    . COD LIVER OIL PO Take 1 tablet by mouth daily.    .  brimonidine (ALPHAGAN) 0.15 % ophthalmic solution Place 1 drop into both eyes.  0  . losartan (COZAAR) 100 MG tablet Take 1 tablet by mouth daily.  3  . prednisoLONE acetate (PRED FORTE) 1 % ophthalmic suspension Place 1 drop into both eyes daily.  0   No current facility-administered medications for this visit.    LABS/IMAGING: No results found for this or any previous visit (from the past 48 hour(s)). No results found.  WEIGHTS: Wt Readings from Last 3 Encounters:  08/25/14 231 lb 1.6 oz (104.826 kg)  05/20/14 229 lb (103.874 kg)  02/23/14 231 lb 8 oz (105.008 kg)    VITALS: BP 160/90 mmHg  Pulse 68  Ht 6\' 3"  (1.905 m)  Wt 231 lb 1.6 oz (104.826 kg)  BMI 28.89 kg/m2  EXAM: General appearance: alert and no distress Neck: no carotid bruit and no JVD Lungs: clear to auscultation bilaterally Heart: regular rate and rhythm, S1, S2 normal, no murmur, click, rub or gallop Abdomen: soft, non-tender; bowel sounds normal; no masses,  no organomegaly Extremities: extremities normal, atraumatic, no cyanosis or edema Pulses: 2+ and symmetric Skin: Skin color, texture, turgor normal. No rashes or lesions Neurologic: Grossly normal Psych: Pleasant  EKG: NSR at 68, LAFB  ASSESSMENT: 1. Mild EKG abnormalities 2. Low risk for upcoming prostate biopsy  PLAN: 1.   John [name] has some mild EKG abnormalities but is completely asymptomatic. He can do more than 4 metabolic equivalents of activity. He never has symptoms of chest pain or shortness of breath with exertion. Cardiac exam is unremarkable and there is no evidence of valvular heart disease. EKG abnormalities are mild and are not specific for ischemia. I do not feel any further preoperative workup is necessary. He is low risk for his upcoming prostate biopsy.  Thank you for the kind referral. I will be available as needed.  Pixie Casino, MD, [facility] Attending Cardiologist Varina C Hilty 08/26/2014, 9:53  AM

## 2014-08-26 ENCOUNTER — Encounter: Payer: Self-pay | Admitting: Internal Medicine

## 2014-08-26 DIAGNOSIS — Z01810 Encounter for preprocedural cardiovascular examination: Secondary | ICD-10-CM | POA: Insufficient documentation

## 2014-08-26 DIAGNOSIS — R9431 Abnormal electrocardiogram [ECG] [EKG]: Secondary | ICD-10-CM | POA: Insufficient documentation

## 2014-09-06 DIAGNOSIS — H4011X3 Primary open-angle glaucoma, severe stage: Secondary | ICD-10-CM | POA: Diagnosis not present

## 2014-09-06 DIAGNOSIS — H40032 Anatomical narrow angle, left eye: Secondary | ICD-10-CM | POA: Diagnosis not present

## 2014-09-08 DIAGNOSIS — H20041 Secondary noninfectious iridocyclitis, right eye: Secondary | ICD-10-CM | POA: Diagnosis not present

## 2014-09-08 DIAGNOSIS — H4011X3 Primary open-angle glaucoma, severe stage: Secondary | ICD-10-CM | POA: Diagnosis not present

## 2014-09-08 DIAGNOSIS — H40033 Anatomical narrow angle, bilateral: Secondary | ICD-10-CM | POA: Diagnosis not present

## 2014-09-09 ENCOUNTER — Ambulatory Visit: Payer: Medicare Other | Admitting: Pulmonary Disease

## 2014-09-09 ENCOUNTER — Other Ambulatory Visit: Payer: Self-pay | Admitting: Urology

## 2014-09-10 ENCOUNTER — Emergency Department (HOSPITAL_COMMUNITY): Payer: No Typology Code available for payment source

## 2014-09-10 ENCOUNTER — Encounter (HOSPITAL_COMMUNITY): Payer: Self-pay

## 2014-09-10 ENCOUNTER — Telehealth: Payer: Self-pay | Admitting: Pulmonary Disease

## 2014-09-10 ENCOUNTER — Emergency Department (HOSPITAL_COMMUNITY)
Admission: EM | Admit: 2014-09-10 | Discharge: 2014-09-10 | Disposition: A | Discharge status: Home / Self Care | Payer: No Typology Code available for payment source | Attending: Emergency Medicine | Admitting: Emergency Medicine

## 2014-09-10 DIAGNOSIS — Z8719 Personal history of other diseases of the digestive system: Secondary | ICD-10-CM | POA: Insufficient documentation

## 2014-09-10 DIAGNOSIS — Z87891 Personal history of nicotine dependence: Secondary | ICD-10-CM | POA: Diagnosis not present

## 2014-09-10 DIAGNOSIS — R51 Headache: Secondary | ICD-10-CM

## 2014-09-10 DIAGNOSIS — S0990XA Unspecified injury of head, initial encounter: Secondary | ICD-10-CM | POA: Diagnosis not present

## 2014-09-10 DIAGNOSIS — S0501XA Injury of conjunctiva and corneal abrasion without foreign body, right eye, initial encounter: Secondary | ICD-10-CM

## 2014-09-10 DIAGNOSIS — Y92009 Unspecified place in unspecified non-institutional (private) residence as the place of occurrence of the external cause: Secondary | ICD-10-CM | POA: Diagnosis not present

## 2014-09-10 DIAGNOSIS — I10 Essential (primary) hypertension: Secondary | ICD-10-CM | POA: Insufficient documentation

## 2014-09-10 DIAGNOSIS — R519 Headache, unspecified: Secondary | ICD-10-CM

## 2014-09-10 DIAGNOSIS — G44319 Acute post-traumatic headache, not intractable: Secondary | ICD-10-CM | POA: Insufficient documentation

## 2014-09-10 DIAGNOSIS — Z7982 Long term (current) use of aspirin: Secondary | ICD-10-CM | POA: Insufficient documentation

## 2014-09-10 DIAGNOSIS — Y999 Unspecified external cause status: Secondary | ICD-10-CM | POA: Diagnosis not present

## 2014-09-10 DIAGNOSIS — Z79899 Other long term (current) drug therapy: Secondary | ICD-10-CM | POA: Diagnosis not present

## 2014-09-10 DIAGNOSIS — Z86010 Personal history of colonic polyps: Secondary | ICD-10-CM | POA: Insufficient documentation

## 2014-09-10 DIAGNOSIS — Z8639 Personal history of other endocrine, nutritional and metabolic disease: Secondary | ICD-10-CM | POA: Diagnosis not present

## 2014-09-10 DIAGNOSIS — Z9889 Other specified postprocedural states: Secondary | ICD-10-CM | POA: Diagnosis not present

## 2014-09-10 DIAGNOSIS — Z8659 Personal history of other mental and behavioral disorders: Secondary | ICD-10-CM | POA: Insufficient documentation

## 2014-09-10 DIAGNOSIS — S0591XA Unspecified injury of right eye and orbit, initial encounter: Secondary | ICD-10-CM | POA: Diagnosis present

## 2014-09-10 DIAGNOSIS — Y939 Activity, unspecified: Secondary | ICD-10-CM | POA: Diagnosis not present

## 2014-09-10 LAB — SEDIMENTATION RATE: SED RATE: 10 mm/h (ref 0–16)

## 2014-09-10 MED ORDER — OXYCODONE-ACETAMINOPHEN 5-325 MG PO TABS
1.0000 | ORAL_TABLET | Freq: Once | ORAL | Status: AC
  Administered 2014-09-10: 1 via ORAL
  Filled 2014-09-10: qty 1

## 2014-09-10 MED ORDER — PROPARACAINE HCL 0.5 % OP SOLN
1.0000 [drp] | Freq: Once | OPHTHALMIC | Status: DC
  Filled 2014-09-10: qty 15

## 2014-09-10 MED ORDER — ERYTHROMYCIN 5 MG/GM OP OINT
1.0000 "application " | TOPICAL_OINTMENT | Freq: Once | OPHTHALMIC | Status: AC
  Administered 2014-09-10: 1 via OPHTHALMIC
  Filled 2014-09-10: qty 3.5

## 2014-09-10 MED ORDER — FLUORESCEIN SODIUM 1 MG OP STRP
1.0000 | ORAL_STRIP | Freq: Once | OPHTHALMIC | Status: DC
  Filled 2014-09-10: qty 1

## 2014-09-10 NOTE — Telephone Encounter (Signed)
lmtcb X1 for pt and wife.

## 2014-09-10 NOTE — Discharge Instructions (Signed)
Please be sure to follow-up with your ophthalmologist on Monday, as well as with your primary care physician next week to insure that your evaluation is complete.  Return to the emergency department for any concerning changes in your condition.  For the next 3 days, please use the provided erythromycin ointment, 3 times daily
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
## 2014-09-10 NOTE — Telephone Encounter (Signed)
Per SN if he is having constant HA he needs to go to the ED for an eval and if pt wants he can have referral to neurology. Pt also needs to find new PCP since SN has had changed in status thanks
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
## 2014-09-10 NOTE — ED Notes (Signed)
MD at bedside. 

## 2014-09-10 NOTE — Telephone Encounter (Signed)
Spoke with pt's wife, aware of recs.  Nothing further needed at this time.  

## 2014-09-16 ENCOUNTER — Ambulatory Visit (INDEPENDENT_AMBULATORY_CARE_PROVIDER_SITE_OTHER): Payer: Medicare Other | Admitting: Pulmonary Disease

## 2014-09-16 ENCOUNTER — Encounter: Payer: Self-pay | Admitting: Pulmonary Disease

## 2014-09-16 VITALS — BP 160/90 | HR 63 | Temp 97.4°F | Wt 230.6 lb

## 2014-09-16 DIAGNOSIS — I872 Venous insufficiency (chronic) (peripheral): Secondary | ICD-10-CM | POA: Diagnosis not present

## 2014-09-16 DIAGNOSIS — R609 Edema, unspecified: Secondary | ICD-10-CM | POA: Insufficient documentation

## 2014-09-16 DIAGNOSIS — I10 Essential (primary) hypertension: Secondary | ICD-10-CM | POA: Diagnosis not present

## 2014-09-16 MED ORDER — FUROSEMIDE 20 MG PO TABS: 20.0000 mg | ORAL_TABLET | Freq: Every day | ORAL | Status: DC
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
## 2014-09-16 NOTE — Patient Instructions (Signed)
Today we updated your med list in our EPIC system...    Continue your current medications the same...  We decided to add FUROSEMIDE (Lasix) 20mg  one tab daily each AM for your BP & swelling in feet...  Remember to eliminate all the salt from your diet...  Call for any questions...  Let's plan a follow up visit in 67months, sooner if needed for problems.Marland KitchenMarland Kitchen

## 2014-09-20 NOTE — Patient Instructions (Addendum)
John [name]  09/20/2014   Your procedure is scheduled on:   10/04/14    Report to [facility] Main  Entrance take [facility]  elevators to 3rd floor to  [place] at   1245pm  Call this number if you have problems the morning of surgery [phone]   Remember: ONLY 1 PERSON MAY GO WITH YOU TO SHORT STAY TO GET  READY MORNING OF YOUR SURGERY.  Do not eat food after midnite.  May have clear liquids until 0800am morning of surgery then nothing by mouth.     FLEETS ENEMA NITE BEFORE SURGERY   Take these medicines the morning of surgery with A SIP OF WATER:   Eyedrops                                You may not have any metal on your body including hair pins and              piercings  Do not wear jewelry, , lotions, powders or perfumes, deodorant                         Men may shave face and neck.   Do not bring valuables to the hospital. Hand.  Contacts, dentures or bridgework may not be worn into surgery.      Patients discharged the day of surgery will not be allowed to drive home.  Name and phone number of your driver:  Special Instructions: coughing and deep breathing exercises, leg exercises               Please read over the following fact sheets you were given: _____________________________________________________________________                CLEAR LIQUID DIET   Foods Allowed                                                                     Foods Excluded  Coffee and tea, regular and decaf                             liquids that you cannot  Plain Jell-O in any flavor                                             see through such as: Fruit ices (not with fruit pulp)                                     milk, soups, orange juice  Iced Popsicles  All solid food Carbonated beverages, regular and diet                                    Cranberry, grape  and apple juices Sports drinks like Gatorade Lightly seasoned clear broth or consume(fat free) Sugar, honey syrup  Sample Menu Breakfast                                Lunch                                     Supper Cranberry juice                    Beef broth                            Chicken broth Jell-O                                     Grape juice                           Apple juice Coffee or tea                        Jell-O                                      Popsicle                                                Coffee or tea                        Coffee or tea  _____________________________________________________________________  [facility] - Preparing for Surgery Before surgery, you can play an important role.  Because skin is not sterile, your skin needs to be as free of germs as possible.  You can reduce the number of germs on your skin by washing with CHG (chlorahexidine gluconate) soap before surgery.  CHG is an antiseptic cleaner which kills germs and bonds with the skin to continue killing germs even after washing. Please DO NOT use if you have an allergy to CHG or antibacterial soaps.  If your skin becomes reddened/irritated stop using the CHG and inform your nurse when you arrive at Short Stay. Do not shave (including legs and underarms) for at least 48 hours prior to the first CHG shower.  You may shave your face/neck. Please follow these instructions carefully:  1.  Shower with CHG Soap the night before surgery and the  morning of Surgery.  2.  If you choose to wash your hair, wash your hair first as usual with your  normal  shampoo.  3.  After you shampoo, rinse your hair and body thoroughly to remove the  shampoo.  4.  Use CHG as you would any other liquid soap.  You can apply chg directly  to the skin and wash                       Gently with a scrungie or clean washcloth.  5.  Apply the CHG Soap to your body ONLY FROM THE NECK DOWN.    Do not use on face/ open                           Wound or open sores. Avoid contact with eyes, ears mouth and genitals (private parts).                       Wash face,  Genitals (private parts) with your normal soap.             6.  Wash thoroughly, paying special attention to the area where your surgery  will be performed.  7.  Thoroughly rinse your body with warm water from the neck down.  8.  DO NOT shower/wash with your normal soap after using and rinsing off  the CHG Soap.                9.  Pat yourself dry with a clean towel.            10.  Wear clean pajamas.            11.  Place clean sheets on your bed the night of your first shower and do not  sleep with pets. Day of Surgery : Do not apply any lotions/deodorants the morning of surgery.  Please wear clean clothes to the hospital/surgery center.  FAILURE TO FOLLOW THESE INSTRUCTIONS MAY RESULT IN THE CANCELLATION OF YOUR SURGERY PATIENT SIGNATURE_________________________________  NURSE SIGNATURE__________________________________  ________________________________________________________________________

## 2014-09-21 ENCOUNTER — Encounter (HOSPITAL_COMMUNITY)
Admission: RE | Admit: 2014-09-21 | Discharge: 2014-09-21 | Disposition: A | Discharge status: Home / Self Care | Payer: Medicare Other | Source: Ambulatory Visit | Attending: Urology | Admitting: Urology

## 2014-09-21 ENCOUNTER — Encounter (HOSPITAL_COMMUNITY): Payer: Self-pay

## 2014-09-21 DIAGNOSIS — H5340 Unspecified visual field defects: Secondary | ICD-10-CM | POA: Diagnosis not present

## 2014-09-21 DIAGNOSIS — H40033 Anatomical narrow angle, bilateral: Secondary | ICD-10-CM | POA: Diagnosis not present

## 2014-09-21 DIAGNOSIS — Z01818 Encounter for other preprocedural examination: Secondary | ICD-10-CM | POA: Insufficient documentation

## 2014-09-21 DIAGNOSIS — H524 Presbyopia: Secondary | ICD-10-CM | POA: Diagnosis not present

## 2014-09-21 DIAGNOSIS — H4011X3 Primary open-angle glaucoma, severe stage: Secondary | ICD-10-CM | POA: Diagnosis not present

## 2014-09-21 HISTORY — DX: Sleep apnea, unspecified: G47.30

## 2014-09-21 LAB — BASIC METABOLIC PANEL
ANION GAP: 6 (ref 5–15)
BUN: 20 mg/dL (ref 6–20)
CALCIUM: 9.1 mg/dL (ref 8.9–10.3)
CHLORIDE: 107 mmol/L (ref 101–111)
CO2: 27 mmol/L (ref 22–32)
CREATININE: 1.63 mg/dL — AB (ref 0.61–1.24)
GFR calc Af Amer: 47 mL/min — ABNORMAL LOW (ref >60)
GFR, EST NON AFRICAN AMERICAN: 41 mL/min — AB (ref >60)
GLUCOSE: 106 mg/dL — AB (ref 65–99)
Potassium: 4 mmol/L (ref 3.5–5.1)
Sodium: 140 mmol/L (ref 135–145)

## 2014-09-21 LAB — CBC
HEMATOCRIT: 38.2 % — AB (ref 39.0–52.0)
Hemoglobin: 13.4 g/dL (ref 13.0–17.0)
MCH: 26.7 pg (ref 26.0–34.0)
MCHC: 35.1 g/dL (ref 30.0–36.0)
MCV: 76.1 fL — AB (ref 78.0–100.0)
Platelets: 117 10*3/uL — ABNORMAL LOW (ref 150–400)
RBC: 5.02 MIL/uL (ref 4.22–5.81)
RDW: 14.7 % (ref 11.5–15.5)
WBC: 4.9 10*3/uL (ref 4.0–10.5)

## 2014-09-21 NOTE — Progress Notes (Addendum)
EKG- 08/25/2014 in [facility]  08/25/2014-preop clearance - on chart  Cardiology in [facility]  09/16/2014- LOV- Pulmonary in EPIC

## 2014-10-01 NOTE — H&P (Signed)
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 Hessie Diener. His PSA at diagnosis was 8.4 and when repeated in March 2003 was 9.84. He sought multiple second opinions to discuss treatment and management options and was seen by Dr. Diona Fanti, Dr. Danny Lawless, and Dr. Lottie Rater at [facility]. He subsequently decided to proceed with surveillance and had been followed at [facility] until September 2009. He transferred his care to me in October 2010. I had recommended that he undergo a surveillance biopsy at that time considering that his last biopsy was in 2004. He initially refused a biopsy but finally proceeded with a surveillance biopsy in June 2011 when his PSA increased to 16.3. This continued to indicated low volume, Gleason 6 prostate cancer and he elected to continue surveillance. He again was hesitant to have surveillance biopsies but he developed acute prostatitis and urinary retention and an increase of his PSA to 30 which remained 30 when rechecked. Understanding the confounding variables affecting his PSA, I did recommend a repeat prostate biopsy before discussing options for his retention which persisted. His biopsy in November 2013 indicated persistent Gleason 6 disease but with increased volume indicating progression. We again had a detailed discussion in December 2013 about his prostate cancer treatment/management options especially considering his urinary retention at that time. He decided that he still did not wish to undergo definitive therapy for his prostate cancer understanding the risk associated with progression of his prostate cancer.    Initial diagnosis: January 2003  TNM stage: cT1c Nx Mx  Gleason score: 3+3=6  PSA at diagnosis: 8.4    Prior biopsies:  Jan 2003: Gleason 3+3=6 adenocarcinoma, 5% of right  biopsies  Aug 2004: Negative  June 2011: 20 core (1 positive) - L apex (3+3=6, 40%), Vol 117 cc, PSAD 0.27 Dec 2011: 12 core - 4/12 cores positive -- L lateral apex (20%, 3+3=6), L apex (65%, 3+3=6), R apex (<2%, 3+3=6), R lateral apex (30%, 3+3=6), Chronic inflammation, Vol 124 cc    2) BPH/LUTS: His baseline symptoms include frequency and nocturia. He has been unable to tolerate tamsulosin in the past. Baseline IPSS: 19. He developed urinary retention after acute prostatitis in September 2013. He has been unable to pass voiding trial despite maximum medical therapy. He ultimately underwent a TURP in February 2014.  Current treatment: None     3) Erectile dysfunction: He has mild erectile dysfunction. SHIM:17. He has not undergone treatment.    4) Premature ejaculation: Baseline intravaginal latency time was less than 2 minutes  Current treatment: Tramadol 25 mg prn     Past Medical History Problems  1. History of hypercholesterolemia (Z86.39) 2. History of hypertension (Z86.79) 3. Prostate cancer (C61)  Surgical History Problems  1. History of Biopsy Of The Prostate Needle 2. History of Transurethral Resection Of Prostate (TURP)  Current Meds 1. Aspirin 81 MG Oral Tablet;  Therapy: (Recorded:01Feb2013) to Recorded 2. Losartan Potassium TABS;  Therapy: (Recorded:16Sep2014) to Recorded 3. Lumigan 0.01 % Ophthalmic Solution;  Therapy: (Recorded:30Oct2013) to Recorded  Allergies Medication  1. No Known Drug Allergies  Family History Problems  1. Family history of Diabetes Mellitus : Father 2. Family history of Heart Disease : Father 3. Denied: Family history of Prostate Cancer 4. Family history of Stroke Syndrome : Mother  Social History Problems  1. Former Smoker  He quit smoking in 1960. 2. Marital History - Currently Married 3. Occupation:   retired    Physical Exam Constitutional: Well nourished and well developed . No acute distress.   Pulmonary: No respiratory distress and normal respiratory rhythm and effort.  Cardiovascular: Heart rate and rhythm are normal . No peripheral edema.   Assessment Assessed  1. Prostate cancer (C61)    Discussion/Summary 1. Prostate cancer: I recommended that he proceed with a routine surveillance biopsy. At his request, he would like this to be in the operating room as we did last time.  I discussed the potential benefits and risks of the procedure, side effects of the proposed treatment, the likelihood of the patient achieving the goals of the procedure, and any potential problems that might occur during the procedure or recuperation.

## 2014-10-03 MED ORDER — FLEET ENEMA 7-19 GM/118ML RE ENEM: 1.0000 | ENEMA | Freq: Once | RECTAL | Status: DC

## 2014-10-04 ENCOUNTER — Ambulatory Visit (HOSPITAL_COMMUNITY): Payer: Medicare Other | Admitting: Anesthesiology

## 2014-10-04 ENCOUNTER — Ambulatory Visit (HOSPITAL_COMMUNITY)
Admission: RE | Admit: 2014-10-04 | Discharge: 2014-10-04 | Disposition: A | Discharge status: Home / Self Care | Payer: Medicare Other | Source: Ambulatory Visit | Attending: Urology | Admitting: Urology

## 2014-10-04 ENCOUNTER — Encounter (HOSPITAL_COMMUNITY): Payer: Self-pay | Admitting: *Deleted

## 2014-10-04 ENCOUNTER — Encounter (HOSPITAL_COMMUNITY)
Admission: RE | Disposition: A | Discharge status: Home / Self Care | Payer: Self-pay | Source: Ambulatory Visit | Attending: Urology

## 2014-10-04 DIAGNOSIS — N210 Calculus in bladder: Secondary | ICD-10-CM | POA: Insufficient documentation

## 2014-10-04 DIAGNOSIS — E780 Pure hypercholesterolemia: Secondary | ICD-10-CM | POA: Diagnosis not present

## 2014-10-04 DIAGNOSIS — R351 Nocturia: Secondary | ICD-10-CM | POA: Insufficient documentation

## 2014-10-04 DIAGNOSIS — Z79899 Other long term (current) drug therapy: Secondary | ICD-10-CM | POA: Diagnosis not present

## 2014-10-04 DIAGNOSIS — F524 Premature ejaculation: Secondary | ICD-10-CM | POA: Diagnosis not present

## 2014-10-04 DIAGNOSIS — Z87891 Personal history of nicotine dependence: Secondary | ICD-10-CM | POA: Insufficient documentation

## 2014-10-04 DIAGNOSIS — R310 Gross hematuria: Secondary | ICD-10-CM | POA: Diagnosis not present

## 2014-10-04 DIAGNOSIS — G4730 Sleep apnea, unspecified: Secondary | ICD-10-CM | POA: Insufficient documentation

## 2014-10-04 DIAGNOSIS — Z7982 Long term (current) use of aspirin: Secondary | ICD-10-CM | POA: Insufficient documentation

## 2014-10-04 DIAGNOSIS — I10 Essential (primary) hypertension: Secondary | ICD-10-CM | POA: Diagnosis not present

## 2014-10-04 DIAGNOSIS — N401 Enlarged prostate with lower urinary tract symptoms: Secondary | ICD-10-CM | POA: Diagnosis not present

## 2014-10-04 DIAGNOSIS — C61 Malignant neoplasm of prostate: Secondary | ICD-10-CM | POA: Insufficient documentation

## 2014-10-04 DIAGNOSIS — R350 Frequency of micturition: Secondary | ICD-10-CM | POA: Insufficient documentation

## 2014-10-04 DIAGNOSIS — N529 Male erectile dysfunction, unspecified: Secondary | ICD-10-CM | POA: Diagnosis not present

## 2014-10-04 DIAGNOSIS — R338 Other retention of urine: Secondary | ICD-10-CM | POA: Insufficient documentation

## 2014-10-04 DIAGNOSIS — I739 Peripheral vascular disease, unspecified: Secondary | ICD-10-CM | POA: Insufficient documentation

## 2014-10-04 HISTORY — PX: CYSTOSCOPY: SHX5120

## 2014-10-04 HISTORY — PX: PROSTATE BIOPSY: SHX241

## 2014-10-04 SURGERY — BIOPSY, PROSTATE, RECTAL APPROACH, WITH US GUIDANCE
Anesthesia: Monitor Anesthesia Care

## 2014-10-04 MED ORDER — OXYCODONE HCL 5 MG/5ML PO SOLN
5.0000 mg | Freq: Once | ORAL | Status: DC | PRN
  Filled 2014-10-04: qty 5

## 2014-10-04 MED ORDER — PROPOFOL INFUSION 10 MG/ML OPTIME
INTRAVENOUS | Status: DC | PRN
  Administered 2014-10-04: 100 ug/kg/min via INTRAVENOUS

## 2014-10-04 MED ORDER — LACTATED RINGERS IV SOLN
INTRAVENOUS | Status: DC
  Administered 2014-10-04: 17:00:00 via INTRAVENOUS
  Administered 2014-10-04: 1000 mL via INTRAVENOUS

## 2014-10-04 MED ORDER — PROPOFOL 10 MG/ML IV BOLUS
INTRAVENOUS | Status: AC
  Filled 2014-10-04: qty 20

## 2014-10-04 MED ORDER — LIDOCAINE HCL 2 % EX GEL
CUTANEOUS | Status: AC
  Filled 2014-10-04: qty 10

## 2014-10-04 MED ORDER — MIDAZOLAM HCL 5 MG/5ML IJ SOLN
INTRAMUSCULAR | Status: DC | PRN
  Administered 2014-10-04: 2 mg via INTRAVENOUS

## 2014-10-04 MED ORDER — MIDAZOLAM HCL 2 MG/2ML IJ SOLN
INTRAMUSCULAR | Status: AC
  Filled 2014-10-04: qty 4

## 2014-10-04 MED ORDER — HYDROCODONE-ACETAMINOPHEN 5-325 MG PO TABS: 1.0000 | ORAL_TABLET | Freq: Four times a day (QID) | ORAL | Status: DC | PRN

## 2014-10-04 MED ORDER — SENNA 8.6 MG PO TABS: 1.0000 | ORAL_TABLET | Freq: Every day | ORAL | Status: DC

## 2014-10-04 MED ORDER — SULFAMETHOXAZOLE-TRIMETHOPRIM 800-160 MG PO TABS: 1.0000 | ORAL_TABLET | Freq: Two times a day (BID) | ORAL | Status: DC

## 2014-10-04 MED ORDER — FENTANYL CITRATE (PF) 100 MCG/2ML IJ SOLN: 25.0000 ug | INTRAMUSCULAR | Status: DC | PRN

## 2014-10-04 MED ORDER — ONDANSETRON HCL 4 MG/2ML IJ SOLN: 4.0000 mg | Freq: Four times a day (QID) | INTRAMUSCULAR | Status: DC | PRN

## 2014-10-04 MED ORDER — FENTANYL CITRATE (PF) 100 MCG/2ML IJ SOLN
INTRAMUSCULAR | Status: DC | PRN
  Administered 2014-10-04: 50 ug via INTRAVENOUS
  Administered 2014-10-04 (×2): 25 ug via INTRAVENOUS

## 2014-10-04 MED ORDER — LIDOCAINE HCL (PF) 2 % IJ SOLN
INTRAMUSCULAR | Status: DC | PRN
  Administered 2014-10-04: 20 mg via INTRADERMAL

## 2014-10-04 MED ORDER — DOCUSATE SODIUM 100 MG PO CAPS: 100.0000 mg | ORAL_CAPSULE | Freq: Two times a day (BID) | ORAL | Status: DC

## 2014-10-04 MED ORDER — ONDANSETRON HCL 4 MG/2ML IJ SOLN
INTRAMUSCULAR | Status: AC
  Filled 2014-10-04: qty 2

## 2014-10-04 MED ORDER — LIDOCAINE HCL 2 % IJ SOLN
INTRAMUSCULAR | Status: DC | PRN
  Administered 2014-10-04: 10 mL

## 2014-10-04 MED ORDER — LIDOCAINE HCL 2 % IJ SOLN
INTRAMUSCULAR | Status: AC
  Filled 2014-10-04: qty 20

## 2014-10-04 MED ORDER — FENTANYL CITRATE (PF) 100 MCG/2ML IJ SOLN
INTRAMUSCULAR | Status: AC
Start: 2014-10-04 — End: 2014-10-04
  Filled 2014-10-04: qty 4

## 2014-10-04 MED ORDER — ONDANSETRON HCL 4 MG/2ML IJ SOLN
INTRAMUSCULAR | Status: DC | PRN
  Administered 2014-10-04: 4 mg via INTRAVENOUS

## 2014-10-04 MED ORDER — PHENAZOPYRIDINE HCL 100 MG PO TABS: 100.0000 mg | ORAL_TABLET | Freq: Three times a day (TID) | ORAL | Status: DC | PRN

## 2014-10-04 MED ORDER — LIDOCAINE HCL 2 % EX GEL
CUTANEOUS | Status: DC | PRN
  Administered 2014-10-04 (×2): 1 via URETHRAL

## 2014-10-04 MED ORDER — STERILE WATER FOR IRRIGATION IR SOLN
Status: DC | PRN
  Administered 2014-10-04: 1000 mL

## 2014-10-04 MED ORDER — OXYCODONE HCL 5 MG PO TABS: 5.0000 mg | ORAL_TABLET | Freq: Once | ORAL | Status: DC | PRN

## 2014-10-04 SURGICAL SUPPLY — 13 items
BAG URO CATCHER STRL LF (DRAPE) ×3 IMPLANT
CATH INTERMIT  6FR 70CM (CATHETERS) IMPLANT
CLOTH BEACON ORANGE TIMEOUT ST (SAFETY) ×3 IMPLANT
GLOVE BIOGEL M STRL SZ7.5 (GLOVE) ×3 IMPLANT
GOWN STRL REUS W/TWL LRG LVL3 (GOWN DISPOSABLE) ×2 IMPLANT
GUIDEWIRE ANG ZIPWIRE 038X150 (WIRE) IMPLANT
GUIDEWIRE STR DUAL SENSOR (WIRE) ×1 IMPLANT
MANIFOLD NEPTUNE II (INSTRUMENTS) ×3 IMPLANT
PACK CYSTO (CUSTOM PROCEDURE TRAY) ×1 IMPLANT
SYR CONTROL 10ML LL (SYRINGE) ×2 IMPLANT
TUBING CONNECTING 10 (TUBING) ×1 IMPLANT
TUBING CONNECTING 10' (TUBING)
UNDERPAD 30X30 INCONTINENT (UNDERPADS AND DIAPERS) ×3 IMPLANT

## 2014-10-04 NOTE — Discharge Instructions (Addendum)
Transrectal Prostate Biopsy Patient Education and Post Procedure Instructions   -Definition A prostate biopsy is the removal of a small amount of tissue from the prostate gland. The tissue is examined to determine whether there is cancer.  -Reasons for Procedure A prostate biopsy is usually done after an abnormal finding by: Digital rectal exam Prostate specific antigen (PSA) blood test A prostate biopsy is the only way to find out if cancer cells are present.  -Possible Complications Problems from the procedure are rare, but all procedures have some risk including: Infection Bruising or lengthy bleeding from the rectum, or in urine or semen Difficulty urinating Reactions to anesthesia Factors that may increase the risk of complications include: Smoking History of bleeding disorders or easy bruising Use of any medications, over-the-counter medications, or herbal supplements Sensitivity or allergy to latex, medications, or anesthesia.  -Prior to Procedure Talk to your doctor about your medications. Blood thinning medications including aspirin should be stopped 1 week prior to procedure. If prescribed by your cardiologist we may need approval before stopping medications. Use a Fleets enema 2 hours before the procedure. Can be purchased at your pharmacy. Antibiotics will be administered in the clinic prior to procedure.  Please make sure you eat a light meal prior to coming in for your appointment. This can help prevent lightheadedness during the procedure and upset stomach from antibiotics. Please bring someone with you to the procedure to drive you home.  -Anesthesia Transrectal biopsy: Local anesthesia--Just the area that is being operated on is numbed using an injectable anesthetic.  -Description of the Procedure Transrectal biopsy--Your doctor will insert a small ultrasound device into the rectum. This device will produce sound waves to create an image of the prostate. These  images will help guide placement of the needle. Your doctor will then insert the needle through the wall of the rectum and into the prostate gland. The procedure should take approximately 15-30 minutes.  -Will It Hurt? You may have discomfort and soreness at the biopsy site. Pain and discomfort after the procedure can be managed with medications.  -Postoperative Care When you return home after the procedure, do the following to help ensure a smooth recovery: Stay hydrated. Drink plenty of fluids for the next few days. Avoid difficult physical activity the day and evening of the procedure. Keep in mind that you may see blood in your urine, stool, or semen for several days. Resume any medications that were stopped when you are advised to do so.  After the sample is taken, it will be sent to a pathologist for examination under a microscope. This doctor will analyze the sample for cancer. You will be scheduled for an appointment to discuss results. If cancer is present, your doctor will work with you to develop a treatment plan.   -Call Your Doctor or Seek Immediate Medical Attention It is important to monitor your recovery. Alert your doctor to any problems. If any of the following occur, call your doctor or go to the emergency room: Fever 100.5 or greater within 1 week post procedure go directly to ER Call the office for: Blood in the urine more than 1 week or in semen for more than 6 weeks post-biopsy Pain that you cannot control with the medications you have been given Pain, burning, urgency, or frequency of urination Cough, shortness of breath, or chest pain- if severe go to ER Heavy rectal bleeding or bleeding that lasts more than 1 week after the biopsy If you have any questions  or concerns please contact our office        General Anesthesia, Care After Refer to this sheet in the next few weeks. These instructions provide you with information on caring for yourself after your  procedure. Your health care provider may also give you more specific instructions. Your treatment has been planned according to current medical practices, but problems sometimes occur. Call your health care provider if you have any problems or questions after your procedure. WHAT TO EXPECT AFTER THE PROCEDURE After the procedure, it is typical to experience:  Sleepiness.  Nausea and vomiting. HOME CARE INSTRUCTIONS  For the first 24 hours after general anesthesia:  Have a responsible person with you.  Do not drive a car. If you are alone, do not take public transportation.  Do not drink alcohol.  Do not take medicine that has not been prescribed by your health care provider.  Do not sign important papers or make important decisions.  You may resume a normal diet and activities as directed by your health care provider.  Change bandages (dressings) as directed.  If you have questions or problems that seem related to general anesthesia, call the hospital and ask for the anesthetist or anesthesiologist on call. SEEK MEDICAL CARE IF:  You have nausea and vomiting that continue the day after anesthesia.  You develop a rash. SEEK IMMEDIATE MEDICAL CARE IF:   You have difficulty breathing.  You have chest pain.  You have any allergic problems. Document Released: 05/07/2000 Document Revised: 02/03/2013 Document Reviewed: 08/14/2012 [facility] Patient Information 2015 [place], [place]. This information is not intended to replace advice given to you by your health care provider. Make sure you discuss any questions you have with your health care provider.

## 2014-10-04 NOTE — Op Note (Signed)
Preoperative diagnosis:  1. Gross hematuria 2.  Prostate cancer  Postoperative diagnosis: 1. Gross hematuria 2.  Prostate cancer  Procedure(s): 1. Flexible cystoscopy 2.  Transrectal ultrasound-guided prostate needle biopsy  Surgeon: Dr. Roxy Horseman, Brooke Bonito  Resident: Dr. Verdis Frederickson  Anesthesia: IV sedation  Complications: None  EBL: Minimal  Specimens: 1.  Right lateral base 2.  Right base 3.  Right lateral mid 4.  Right mid 5.  Right lateral apex 6.  Right apex 7.  Left lateral base 8.  Left base 9.  Left lateral mid 10.  Left mid 11.  Left lateral apex 12.  Left apex 13.  Anterior  Disposition of specimens: Pathology  Intraoperative findings: Flexible cystoscopy revealed a prior TUR defect in the prostatic urethra with a friable prostate likely the source of his bleeding.  No other abnormalities were noted.  Indication: Mr. Wain is a 74 year old gentleman with a history of prostate cancer undergoing active surveillance.  He has been overdue for a prostate biopsy for further evaluation and presents today for that purpose.  In addition, he recently developed gross hematuria and underwent CT imaging of his upper urinary tracts without a clear etiology.  He delayed undergoing cystoscopy and wish to have this performed at the same setting as his prostate biopsy. We reviewed the potential risks, complications, and expected recovery process associated with the above procedures.  He gave his informed consent to proceed.  Description of procedure:  The patient was taken the operating room and IV sedation was administered.  He was given preoperative antibiotics, placed in the supine position, and prepped and draped in usual sterile fashion. A preoperative timeout was performed.  Next, flexible cystourethroscopy was performed. This revealed a normal anterior urethra.  Inspection of the prostatic urethra revealed findings consistent with a prior TUR defect.  The prostate was  friable and did bleed easily upon passing the scope.  However, no urethral tumors were identified. Systematic examination of the bladder revealed ureteral orifices to be in their expected anatomic location.  No bladder tumors or other mucosal abnormalities were identified.  A very small bladder stone was identified measuring approximately 3 mm.  The flexible cystoscope was then withdrawn with the prostate being the most likely source of his prior bleeding.  The patient was then placed in the left lateral decubitus position. A digital rectal exam was performed which did reveal mild induration of the lateral right base and mid gland. The transrectal ultrasound probe was then placed into the rectum.  Prostate volume was measured to be 120.0 cc. The prostate was mostly homogeneous with a TUR defect noted.  There was one area within the anterior right portion of the prostate that did demonstrate some hypo-echogenicity although was felt this also could be artifact related to the nearby TUR defect.  A total of 15 biopsy cores were obtained with 2 cores each from the lateral and parasagittal sections of the right and left apex, mid, and base regions.  In addition, 3 directed biopsies were taken from the suspected hypoechoic area anteriorly.  All biopsy cores were taken under direct ultrasound guidance.  All biopsy cores were placed and buffered formalin and sent to pathology.  The patient tolerated the procedure well and without complications.  He was able to be transferred to the recovery unit in satisfactory condition.

## 2014-10-04 NOTE — Anesthesia Postprocedure Evaluation (Signed)
Anesthesia Post Note  Patient: John [name]  Procedure(s) Performed: Procedure(s) (LRB): BIOPSY TRANSRECTAL ULTRASONIC PROSTATE (TUBP) (N/A) CYSTOSCOPY FLEXIBLE (N/A)  Anesthesia type: MAC  Patient location: PACU  Post pain: Pain level controlled and Adequate analgesia  Post assessment: Post-op Vital signs reviewed, Patient's Cardiovascular Status Stable and Respiratory Function Stable  Last Vitals:  Filed Vitals:   10/04/14 1838  BP: 171/97  Pulse: 53  Temp: 36.2 C  Resp: 16    Post vital signs: Reviewed and stable  Level of consciousness: awake, alert  and oriented  Complications: No apparent anesthesia complications

## 2014-10-04 NOTE — Anesthesia Preprocedure Evaluation (Signed)
Anesthesia Evaluation  Patient identified by MRN, date of birth, ID band Patient awake    Reviewed: Allergy & Precautions, NPO status , Patient's Chart, lab work & pertinent test results  Airway Mallampati: II   Neck ROM: full    Dental   Pulmonary sleep apnea , former smoker,  breath sounds clear to auscultation        Cardiovascular hypertension, + Peripheral Vascular Disease Rhythm:regular Rate:Normal     Neuro/Psych Anxiety    GI/Hepatic   Endo/Other    Renal/GU    Prostate CA.    Musculoskeletal   Abdominal   Peds  Hematology   Anesthesia Other Findings   Reproductive/Obstetrics                             Anesthesia Physical Anesthesia Plan  ASA: III  Anesthesia Plan: MAC   Post-op Pain Management:    Induction: Intravenous  Airway Management Planned: Simple Face Mask  Additional Equipment:   Intra-op Plan:   Post-operative Plan:   Informed Consent: I have reviewed the patients History and Physical, chart, labs and discussed the procedure including the risks, benefits and alternatives for the proposed anesthesia with the patient or authorized representative who has indicated his/her understanding and acceptance.     Plan Discussed with: CRNA, Anesthesiologist and Surgeon  Anesthesia Plan Comments:         Anesthesia Quick Evaluation

## 2014-10-04 NOTE — Transfer of Care (Signed)
Immediate Anesthesia Transfer of Care Note  Patient: John [name]  Procedure(s) Performed: Procedure(s): BIOPSY TRANSRECTAL ULTRASONIC PROSTATE (TUBP) (N/A) CYSTOSCOPY FLEXIBLE (N/A)  Patient Location: PACU  Anesthesia Type:MAC  Level of Consciousness:  sedated, patient cooperative and responds to stimulation  Airway & Oxygen Therapy:Patient Spontanous Breathing and Patient connected to face mask oxgen  Post-op Assessment:  Report given to PACU RN and Post -op Vital signs reviewed and stable  Post vital signs:  Reviewed and stable  Last Vitals:  Filed Vitals:   10/04/14 1311  BP: 155/94  Pulse: 66  Temp: 36.8 C  Resp: 18    Complications: No apparent anesthesia complications

## 2014-10-05 ENCOUNTER — Encounter (HOSPITAL_COMMUNITY): Payer: Self-pay | Admitting: Urology

## 2014-10-14 DIAGNOSIS — H20041 Secondary noninfectious iridocyclitis, right eye: Secondary | ICD-10-CM | POA: Diagnosis not present

## 2014-10-14 DIAGNOSIS — H4011X3 Primary open-angle glaucoma, severe stage: Secondary | ICD-10-CM | POA: Diagnosis not present

## 2014-10-14 DIAGNOSIS — H40033 Anatomical narrow angle, bilateral: Secondary | ICD-10-CM | POA: Diagnosis not present

## 2014-11-19 DIAGNOSIS — R310 Gross hematuria: Secondary | ICD-10-CM | POA: Diagnosis not present

## 2014-12-21 DIAGNOSIS — H40051 Ocular hypertension, right eye: Secondary | ICD-10-CM | POA: Diagnosis not present

## 2014-12-21 DIAGNOSIS — H401133 Primary open-angle glaucoma, bilateral, severe stage: Secondary | ICD-10-CM | POA: Diagnosis not present

## 2015-02-08 DIAGNOSIS — H401133 Primary open-angle glaucoma, bilateral, severe stage: Secondary | ICD-10-CM | POA: Diagnosis not present

## 2015-02-08 DIAGNOSIS — H40033 Anatomical narrow angle, bilateral: Secondary | ICD-10-CM | POA: Diagnosis not present

## 2015-04-13 DIAGNOSIS — N400 Enlarged prostate without lower urinary tract symptoms: Secondary | ICD-10-CM | POA: Diagnosis not present

## 2015-05-02 DIAGNOSIS — N400 Enlarged prostate without lower urinary tract symptoms: Secondary | ICD-10-CM | POA: Diagnosis not present

## 2015-05-02 DIAGNOSIS — Z0000 Encounter for general adult medical examination without abnormal findings: Secondary | ICD-10-CM | POA: Diagnosis not present

## 2015-05-02 DIAGNOSIS — C61 Malignant neoplasm of prostate: Secondary | ICD-10-CM | POA: Diagnosis not present

## 2015-05-27 ENCOUNTER — Other Ambulatory Visit: Payer: Self-pay | Admitting: Pulmonary Disease

## 2015-06-01 ENCOUNTER — Encounter: Payer: Self-pay | Admitting: Gastroenterology

## 2015-07-27 DIAGNOSIS — H40033 Anatomical narrow angle, bilateral: Secondary | ICD-10-CM | POA: Diagnosis not present

## 2015-07-27 DIAGNOSIS — H401123 Primary open-angle glaucoma, left eye, severe stage: Secondary | ICD-10-CM | POA: Diagnosis not present

## 2015-07-27 DIAGNOSIS — H401113 Primary open-angle glaucoma, right eye, severe stage: Secondary | ICD-10-CM | POA: Diagnosis not present

## 2015-07-27 DIAGNOSIS — H40051 Ocular hypertension, right eye: Secondary | ICD-10-CM | POA: Diagnosis not present

## 2015-07-27 LAB — HM DIABETES EYE EXAM

## 2015-08-08 ENCOUNTER — Encounter: Payer: Self-pay | Admitting: Pulmonary Disease

## 2015-09-18 ENCOUNTER — Other Ambulatory Visit: Payer: Self-pay | Admitting: Pulmonary Disease

## 2015-10-26 ENCOUNTER — Telehealth: Payer: Self-pay | Admitting: Pulmonary Disease

## 2015-10-26 DIAGNOSIS — C61 Malignant neoplasm of prostate: Secondary | ICD-10-CM | POA: Diagnosis not present

## 2015-10-26 NOTE — Telephone Encounter (Signed)
Patient returning our call -  He can be reached at [phone]

## 2015-10-26 NOTE — Telephone Encounter (Signed)
lmtcb x1 for pt. 

## 2015-10-26 NOTE — Telephone Encounter (Signed)
Spoke with pt and he was needing to know if he had any cancer screenings within the past year. Reviewed past procedures, labs and imaging with pt. There was nothing of note within the past year. Nothing further needed.

## 2015-11-02 DIAGNOSIS — C61 Malignant neoplasm of prostate: Secondary | ICD-10-CM | POA: Diagnosis not present

## 2015-11-02 DIAGNOSIS — R350 Frequency of micturition: Secondary | ICD-10-CM | POA: Diagnosis not present

## 2015-11-02 DIAGNOSIS — N400 Enlarged prostate without lower urinary tract symptoms: Secondary | ICD-10-CM | POA: Diagnosis not present

## 2015-11-16 ENCOUNTER — Ambulatory Visit (INDEPENDENT_AMBULATORY_CARE_PROVIDER_SITE_OTHER)
Admission: RE | Admit: 2015-11-16 | Discharge: 2015-11-16 | Disposition: A | Discharge status: Home / Self Care | Payer: Medicare Other | Source: Ambulatory Visit | Attending: Pulmonary Disease | Admitting: Pulmonary Disease

## 2015-11-16 ENCOUNTER — Encounter: Payer: Self-pay | Admitting: Pulmonary Disease

## 2015-11-16 ENCOUNTER — Ambulatory Visit (INDEPENDENT_AMBULATORY_CARE_PROVIDER_SITE_OTHER): Payer: Medicare Other | Admitting: Pulmonary Disease

## 2015-11-16 VITALS — BP 150/82 | HR 84 | Temp 97.0°F | Ht 74.5 in | Wt 231.0 lb

## 2015-11-16 DIAGNOSIS — C61 Malignant neoplasm of prostate: Secondary | ICD-10-CM

## 2015-11-16 DIAGNOSIS — I493 Ventricular premature depolarization: Secondary | ICD-10-CM

## 2015-11-16 DIAGNOSIS — R7989 Other specified abnormal findings of blood chemistry: Secondary | ICD-10-CM

## 2015-11-16 DIAGNOSIS — F411 Generalized anxiety disorder: Secondary | ICD-10-CM

## 2015-11-16 DIAGNOSIS — I10 Essential (primary) hypertension: Secondary | ICD-10-CM | POA: Diagnosis not present

## 2015-11-16 DIAGNOSIS — E559 Vitamin D deficiency, unspecified: Secondary | ICD-10-CM

## 2015-11-16 DIAGNOSIS — I872 Venous insufficiency (chronic) (peripheral): Secondary | ICD-10-CM | POA: Diagnosis not present

## 2015-11-16 DIAGNOSIS — D126 Benign neoplasm of colon, unspecified: Secondary | ICD-10-CM

## 2015-11-16 DIAGNOSIS — Z0000 Encounter for general adult medical examination without abnormal findings: Secondary | ICD-10-CM | POA: Diagnosis not present

## 2015-11-16 DIAGNOSIS — E7800 Pure hypercholesterolemia, unspecified: Secondary | ICD-10-CM | POA: Diagnosis not present

## 2015-11-16 DIAGNOSIS — R946 Abnormal results of thyroid function studies: Secondary | ICD-10-CM

## 2015-11-16 MED ORDER — FUROSEMIDE 20 MG PO TABS: 20.0000 mg | ORAL_TABLET | Freq: Every day | ORAL | 2 refills | Status: DC | PRN

## 2015-11-16 NOTE — Progress Notes (Signed)
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  Supple w/ fairROM; no JVD; normal carotid impulses w/o bruits; no thyromegaly or nodules palpated; no lymphadenopathy. CHEST:  Clear to P & A; without wheezes/ rales/ or rhonchi. HEART:  Regular Rhythm; without murmurs/ rubs/ or gallops. ABDOMEN:  Soft & nontender; normal bowel sounds; no organomegaly or masses detected. Rectal exam is deferred... EXT: without deformities, mild arthritic changes noted; no varicose veins/ +venous insuffic= 1+ & tr edema. NEURO:  CN's intact; motor testing normal; sensory testing normal; gait normal & balance OK. DERM:  No lesions noted; no rash etc...  RADIOLOGY DATA:  Reviewed in the EPIC EMR & discussed w/ the patient...  LABORATORY DATA:  Reviewed in the EPIC EMR & discussed w/ the patient...   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  Ven Insuffic w/ edema>  We reviewed plans for low sodium, elev legs, wear support hose,  Decided to ADD LASIX20mg /d & watch BP & renal function...  HBP>  BP is fair on Cozaar100, we decided to add Lasix20/d, monitor BP & renal fnction...  CHOL>   Remains on diet alone w/ fair numbers, he refuses med Rx...  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  Son has AS hemoglobin>  Pt wants test for sickle trait & we will order a hemoglobin electrophoresis at his request (test wasn't carried out by the lab)...   Patient's Medications  New Prescriptions   No medications on file  Previous Medications   ACETAMINOPHEN (TYLENOL) 500 MG TABLET    Take 500-1,000  mg by mouth every 6 (six) hours as needed.   BIMATOPROST (LUMIGAN) 0.01 % SOLN    Place 1 drop into both eyes at bedtime.   BRIMONIDINE (ALPHAGAN) 0.15 % OPHTHALMIC SOLUTION    Place 1 drop into both eyes 2 (two) times daily.    COD LIVER OIL PO    Take 1 tablet by mouth 2 (two) times a week.    LOSARTAN (COZAAR) 100 MG TABLET    TAKE 1 TABLET BY MOUTH DAILY.   POLYVINYL ALCOHOL (LIQUIFILM TEARS) 1.4 % OPHTHALMIC SOLUTION    Place 1 drop into both eyes 6 (six) times daily.  Modified Medications   Modified Medication Previous Medication   FUROSEMIDE (LASIX) 20 MG TABLET furosemide (LASIX) 20 MG tablet      Take 1 tablet (20 mg total) by mouth daily as needed for edema.    Take 1 tablet (20 mg total) by mouth daily.  Discontinued Medications   HYDROCODONE-ACETAMINOPHEN (NORCO/VICODIN) 5-325 MG PER TABLET    Take 1-2 tablets by mouth every 6 (six) hours as needed.   LOSARTAN (COZAAR) 100 MG TABLET    Take 1 tablet by mouth every morning.    PHENAZOPYRIDINE (PYRIDIUM) 100 MG TABLET    Take 1 tablet (100 mg total) by mouth 3 (three) times daily as needed for pain (for burning).   PREDNISOLONE ACETATE (PRED FORTE) 1 % OPHTHALMIC SUSPENSION    Place 1 drop into both eyes as directed. 1 drop in right eye QID for 3 days last day to use QID id 09/10/14 then on 09/11/14  pt is to use BID for 3 days and then on 09/14/14 use once daily for 3 days. Course to stop on 09/17/14   SENNA (SENOKOT) 8.6 MG TABS TABLET    Take 1 tablet (8.6 mg total) by mouth daily.

## 2015-11-16 NOTE — Patient Instructions (Addendum)
Today we updated your med list in our EPIC system...    Continue your current medications the same...    We refilled your meds per request...  Today we reviewed your medical history & tried to keep you informed about your prostate cancer and status of your colon screening- we will contact DrJacobs about your colonoscopy due...  We also rechecked your CXR & ask that you return to our lab one morning this week for your FASTING blood work...    We will contact you w/ the results when available...   Keep up the good work w/ your calorie restricted diet, no salt, & your exercise program...    The goal is to lose 10-15 lbs...  Call for any questions...  Let's plan a follow up visit in 54mo, sooner if needed for problems.Marland KitchenMarland Kitchen

## 2015-12-06 ENCOUNTER — Telehealth: Payer: Self-pay | Admitting: Pulmonary Disease

## 2015-12-06 NOTE — Telephone Encounter (Signed)
Called by Mrs Galli who relates that her husband, John [name], has had excruciating stomarch pain associated with diaphoresis for several hours today. The diaphoresis is now resolved. However, the abdominal pain persists. DDx: Includes: Cramping from gas vs viral gastroenteritis vs surgical abdomen vs acute MI. It is impossible to make a good diagnosis over the phone. Recommended that John [name] go to the Emergency Department for complete evaluation and possible admission to the [facility].

## 2015-12-07 ENCOUNTER — Encounter (HOSPITAL_COMMUNITY): Payer: Self-pay | Admitting: Emergency Medicine

## 2015-12-07 ENCOUNTER — Inpatient Hospital Stay (HOSPITAL_COMMUNITY): Payer: Medicare Other

## 2015-12-07 ENCOUNTER — Inpatient Hospital Stay (HOSPITAL_COMMUNITY)
Admission: EM | Admit: 2015-12-07 | Discharge: 2015-12-16 | DRG: 336 | Disposition: A | Discharge status: Home / Self Care | Payer: Medicare Other | Attending: Surgery | Admitting: Surgery

## 2015-12-07 ENCOUNTER — Telehealth: Payer: Self-pay | Admitting: Pulmonary Disease

## 2015-12-07 ENCOUNTER — Emergency Department (HOSPITAL_COMMUNITY): Payer: Medicare Other

## 2015-12-07 ENCOUNTER — Other Ambulatory Visit: Payer: Self-pay

## 2015-12-07 DIAGNOSIS — Z0189 Encounter for other specified special examinations: Secondary | ICD-10-CM

## 2015-12-07 DIAGNOSIS — R05 Cough: Secondary | ICD-10-CM | POA: Diagnosis not present

## 2015-12-07 DIAGNOSIS — K56699 Other intestinal obstruction unspecified as to partial versus complete obstruction: Secondary | ICD-10-CM | POA: Diagnosis not present

## 2015-12-07 DIAGNOSIS — N401 Enlarged prostate with lower urinary tract symptoms: Secondary | ICD-10-CM | POA: Diagnosis present

## 2015-12-07 DIAGNOSIS — R0981 Nasal congestion: Secondary | ICD-10-CM | POA: Diagnosis present

## 2015-12-07 DIAGNOSIS — I872 Venous insufficiency (chronic) (peripheral): Secondary | ICD-10-CM | POA: Diagnosis present

## 2015-12-07 DIAGNOSIS — R059 Cough, unspecified: Secondary | ICD-10-CM

## 2015-12-07 DIAGNOSIS — Z87891 Personal history of nicotine dependence: Secondary | ICD-10-CM | POA: Diagnosis not present

## 2015-12-07 DIAGNOSIS — Z86010 Personal history of colonic polyps: Secondary | ICD-10-CM

## 2015-12-07 DIAGNOSIS — F419 Anxiety disorder, unspecified: Secondary | ICD-10-CM | POA: Diagnosis present

## 2015-12-07 DIAGNOSIS — N62 Hypertrophy of breast: Secondary | ICD-10-CM | POA: Diagnosis present

## 2015-12-07 DIAGNOSIS — D573 Sickle-cell trait: Secondary | ICD-10-CM | POA: Diagnosis present

## 2015-12-07 DIAGNOSIS — E7800 Pure hypercholesterolemia, unspecified: Secondary | ICD-10-CM | POA: Diagnosis present

## 2015-12-07 DIAGNOSIS — K565 Intestinal adhesions [bands], unspecified as to partial versus complete obstruction: Secondary | ICD-10-CM | POA: Diagnosis not present

## 2015-12-07 DIAGNOSIS — K913 Postprocedural intestinal obstruction, unspecified as to partial versus complete: Secondary | ICD-10-CM | POA: Diagnosis not present

## 2015-12-07 DIAGNOSIS — R319 Hematuria, unspecified: Secondary | ICD-10-CM | POA: Diagnosis not present

## 2015-12-07 DIAGNOSIS — K56609 Unspecified intestinal obstruction, unspecified as to partial versus complete obstruction: Secondary | ICD-10-CM

## 2015-12-07 DIAGNOSIS — Y92239 Unspecified place in hospital as the place of occurrence of the external cause: Secondary | ICD-10-CM | POA: Diagnosis not present

## 2015-12-07 DIAGNOSIS — G4730 Sleep apnea, unspecified: Secondary | ICD-10-CM | POA: Diagnosis present

## 2015-12-07 DIAGNOSIS — H409 Unspecified glaucoma: Secondary | ICD-10-CM | POA: Diagnosis present

## 2015-12-07 DIAGNOSIS — I10 Essential (primary) hypertension: Secondary | ICD-10-CM | POA: Diagnosis not present

## 2015-12-07 DIAGNOSIS — E876 Hypokalemia: Secondary | ICD-10-CM | POA: Diagnosis not present

## 2015-12-07 DIAGNOSIS — I444 Left anterior fascicular block: Secondary | ICD-10-CM | POA: Diagnosis present

## 2015-12-07 DIAGNOSIS — J9811 Atelectasis: Secondary | ICD-10-CM | POA: Diagnosis not present

## 2015-12-07 DIAGNOSIS — Y838 Other surgical procedures as the cause of abnormal reaction of the patient, or of later complication, without mention of misadventure at the time of the procedure: Secondary | ICD-10-CM | POA: Diagnosis not present

## 2015-12-07 DIAGNOSIS — N281 Cyst of kidney, acquired: Secondary | ICD-10-CM | POA: Diagnosis present

## 2015-12-07 DIAGNOSIS — R066 Hiccough: Secondary | ICD-10-CM | POA: Diagnosis not present

## 2015-12-07 DIAGNOSIS — N179 Acute kidney failure, unspecified: Secondary | ICD-10-CM | POA: Diagnosis not present

## 2015-12-07 DIAGNOSIS — Z8546 Personal history of malignant neoplasm of prostate: Secondary | ICD-10-CM

## 2015-12-07 DIAGNOSIS — I129 Hypertensive chronic kidney disease with stage 1 through stage 4 chronic kidney disease, or unspecified chronic kidney disease: Secondary | ICD-10-CM | POA: Diagnosis present

## 2015-12-07 DIAGNOSIS — R9431 Abnormal electrocardiogram [ECG] [EKG]: Secondary | ICD-10-CM | POA: Diagnosis not present

## 2015-12-07 DIAGNOSIS — R140 Abdominal distension (gaseous): Secondary | ICD-10-CM | POA: Diagnosis not present

## 2015-12-07 DIAGNOSIS — R509 Fever, unspecified: Secondary | ICD-10-CM | POA: Diagnosis not present

## 2015-12-07 DIAGNOSIS — Z8249 Family history of ischemic heart disease and other diseases of the circulatory system: Secondary | ICD-10-CM

## 2015-12-07 DIAGNOSIS — Z9079 Acquired absence of other genital organ(s): Secondary | ICD-10-CM | POA: Diagnosis not present

## 2015-12-07 DIAGNOSIS — D696 Thrombocytopenia, unspecified: Secondary | ICD-10-CM | POA: Diagnosis not present

## 2015-12-07 DIAGNOSIS — N183 Chronic kidney disease, stage 3 (moderate): Secondary | ICD-10-CM | POA: Diagnosis present

## 2015-12-07 DIAGNOSIS — Z4682 Encounter for fitting and adjustment of non-vascular catheter: Secondary | ICD-10-CM | POA: Diagnosis not present

## 2015-12-07 LAB — CBC
HEMATOCRIT: 40.8 % (ref 39.0–52.0)
Hemoglobin: 14.4 g/dL (ref 13.0–17.0)
MCH: 26.6 pg (ref 26.0–34.0)
MCHC: 35.3 g/dL (ref 30.0–36.0)
MCV: 75.4 fL — AB (ref 78.0–100.0)
Platelets: 126 10*3/uL — ABNORMAL LOW (ref 150–400)
RBC: 5.41 MIL/uL (ref 4.22–5.81)
RDW: 14.5 % (ref 11.5–15.5)
WBC: 8.3 10*3/uL (ref 4.0–10.5)

## 2015-12-07 LAB — COMPREHENSIVE METABOLIC PANEL
ALBUMIN: 3.9 g/dL (ref 3.5–5.0)
ALT: 15 U/L — ABNORMAL LOW (ref 17–63)
AST: 22 U/L (ref 15–41)
Alkaline Phosphatase: 72 U/L (ref 38–126)
Anion gap: 12 (ref 5–15)
BUN: 17 mg/dL (ref 6–20)
CHLORIDE: 105 mmol/L (ref 101–111)
CO2: 22 mmol/L (ref 22–32)
Calcium: 9.3 mg/dL (ref 8.9–10.3)
Creatinine, Ser: 1.64 mg/dL — ABNORMAL HIGH (ref 0.61–1.24)
GFR calc Af Amer: 47 mL/min — ABNORMAL LOW (ref >60)
GFR calc non Af Amer: 40 mL/min — ABNORMAL LOW (ref >60)
GLUCOSE: 165 mg/dL — AB (ref 65–99)
POTASSIUM: 3.9 mmol/L (ref 3.5–5.1)
SODIUM: 139 mmol/L (ref 135–145)
Total Bilirubin: 1 mg/dL (ref 0.3–1.2)
Total Protein: 7.3 g/dL (ref 6.5–8.1)

## 2015-12-07 LAB — URINALYSIS, ROUTINE W REFLEX MICROSCOPIC
Bilirubin Urine: NEGATIVE
GLUCOSE, UA: NEGATIVE mg/dL
Hgb urine dipstick: NEGATIVE
KETONES UR: 15 mg/dL — AB
LEUKOCYTES UA: NEGATIVE
Nitrite: NEGATIVE
PH: 7 (ref 5.0–8.0)
Protein, ur: NEGATIVE mg/dL
SPECIFIC GRAVITY, URINE: 1.02 (ref 1.005–1.030)

## 2015-12-07 LAB — LIPASE, BLOOD: LIPASE: 19 U/L (ref 11–51)

## 2015-12-07 MED ORDER — HYDROMORPHONE HCL 2 MG/ML IJ SOLN
1.0000 mg | INTRAMUSCULAR | Status: DC | PRN
  Administered 2015-12-07 – 2015-12-09 (×9): 1 mg via INTRAVENOUS
  Filled 2015-12-07 (×10): qty 1

## 2015-12-07 MED ORDER — LATANOPROST 0.005 % OP SOLN
1.0000 [drp] | Freq: Every day | OPHTHALMIC | Status: DC
  Administered 2015-12-07 – 2015-12-15 (×9): 1 [drp] via OPHTHALMIC
  Filled 2015-12-07: qty 2.5

## 2015-12-07 MED ORDER — ONDANSETRON HCL 4 MG/2ML IJ SOLN
4.0000 mg | Freq: Once | INTRAMUSCULAR | Status: AC
  Administered 2015-12-07: 4 mg via INTRAVENOUS
  Filled 2015-12-07: qty 2

## 2015-12-07 MED ORDER — HYDRALAZINE HCL 20 MG/ML IJ SOLN
10.0000 mg | Freq: Four times a day (QID) | INTRAMUSCULAR | Status: DC | PRN
  Administered 2015-12-07: 10 mg via INTRAVENOUS
  Filled 2015-12-07: qty 1

## 2015-12-07 MED ORDER — BRIMONIDINE TARTRATE 0.15 % OP SOLN
1.0000 [drp] | Freq: Two times a day (BID) | OPHTHALMIC | Status: DC
  Administered 2015-12-07 – 2015-12-16 (×15): 1 [drp] via OPHTHALMIC
  Filled 2015-12-07: qty 5

## 2015-12-07 MED ORDER — POLYVINYL ALCOHOL 1.4 % OP SOLN
1.0000 [drp] | Freq: Every day | OPHTHALMIC | Status: DC
  Administered 2015-12-07 – 2015-12-16 (×21): 1 [drp] via OPHTHALMIC
  Filled 2015-12-07: qty 15

## 2015-12-07 MED ORDER — SODIUM CHLORIDE 0.9 % IV SOLN
INTRAVENOUS | Status: DC
  Administered 2015-12-07 – 2015-12-08 (×5): via INTRAVENOUS
  Administered 2015-12-09: 1 mL via INTRAVENOUS
  Administered 2015-12-10 – 2015-12-16 (×8): via INTRAVENOUS

## 2015-12-07 MED ORDER — ONDANSETRON 4 MG PO TBDP: 4.0000 mg | ORAL_TABLET | Freq: Four times a day (QID) | ORAL | Status: DC | PRN

## 2015-12-07 MED ORDER — SODIUM CHLORIDE 0.9 % IV BOLUS (SEPSIS)
500.0000 mL | Freq: Once | INTRAVENOUS | Status: AC
  Administered 2015-12-07: 500 mL via INTRAVENOUS

## 2015-12-07 MED ORDER — IOPAMIDOL (ISOVUE-300) INJECTION 61%
15.0000 mL | INTRAVENOUS | Status: AC
Start: 2015-12-07 — End: 2015-12-07
  Administered 2015-12-07: 15 mL via ORAL

## 2015-12-07 MED ORDER — HYDRALAZINE HCL 20 MG/ML IJ SOLN
10.0000 mg | INTRAMUSCULAR | Status: DC | PRN
  Administered 2015-12-08 – 2015-12-09 (×3): 10 mg via INTRAVENOUS
  Filled 2015-12-07 (×3): qty 1

## 2015-12-07 MED ORDER — MORPHINE SULFATE (PF) 4 MG/ML IV SOLN
4.0000 mg | Freq: Once | INTRAVENOUS | Status: AC
  Administered 2015-12-07: 4 mg via INTRAVENOUS
  Filled 2015-12-07: qty 1

## 2015-12-07 MED ORDER — IOPAMIDOL (ISOVUE-300) INJECTION 61%
INTRAVENOUS | Status: AC
  Filled 2015-12-07: qty 30

## 2015-12-07 MED ORDER — SODIUM CHLORIDE 0.9 % IV BOLUS (SEPSIS)
1000.0000 mL | Freq: Once | INTRAVENOUS | Status: AC
  Administered 2015-12-07: 1000 mL via INTRAVENOUS

## 2015-12-07 MED ORDER — ONDANSETRON HCL 4 MG/2ML IJ SOLN
4.0000 mg | Freq: Four times a day (QID) | INTRAMUSCULAR | Status: DC | PRN
  Administered 2015-12-08 – 2015-12-09 (×3): 4 mg via INTRAVENOUS
  Filled 2015-12-07 (×2): qty 2

## 2015-12-07 MED ORDER — WHITE PETROLATUM GEL
Status: AC
  Administered 2015-12-07: 18:00:00
  Filled 2015-12-07: qty 1

## 2015-12-07 MED ORDER — HEPARIN SODIUM (PORCINE) 5000 UNIT/ML IJ SOLN
5000.0000 [IU] | Freq: Three times a day (TID) | INTRAMUSCULAR | Status: DC
  Administered 2015-12-07 – 2015-12-16 (×27): 5000 [IU] via SUBCUTANEOUS
  Filled 2015-12-07 (×26): qty 1

## 2015-12-07 NOTE — Progress Notes (Signed)
John [name] is a 75 y.o. male patient admitted from ED awake, alert - oriented  X 4 - no acute distress noted.  VSS - Blood pressure (!) 203/91, pulse 63, temperature 97.7 F (36.5 C), temperature source Oral, resp. rate 19, height 6\' 3"  (1.905 m), weight 99.8 kg (220 lb), SpO2 98 %.    IV in place, occlusive dsg intact without redness.  Orientation to room, and floor completed with information packet given to patient/family.  Admission INP armband ID verified with patient/family, and in place.   SR up x 2, fall assessment complete, with patient and family able to verbalize understanding of risk associated with falls, and verbalized understanding to call nsg before up out of bed.  Call light within reach, patient able to voice, and demonstrate understanding.  Skin, clean-dry- intact without evidence of bruising, or skin tears.   No evidence of skin break down noted on exam.     Notified Will Creig Hines, PA of patient's BP and patient refusing to have NG tube placed.  No new orders received. Gave patient 1mg  of dilaudid  Theora Master, RN 12/07/2015 10:03 AM

## 2015-12-07 NOTE — ED Notes (Signed)
Patient transported to CT 

## 2015-12-07 NOTE — Progress Notes (Signed)
Called about BP 200/100, only 300 urine output so far today.  He is still distended and very uncomfortable, no bowel sounds.  I have given him some hydralazine and no real improvement so far  194/109  HR 47, Sat 98%.after hydralazine.  Films at 1437 hours shows: Dilated small bowel loops with air-fluid levels compatible with small bowel obstruction as seen on prior CT. No free air. No organomegaly or suspicious calcification.   Exam:  Tired, still very distended and uncomfortable, no BS.  No flatus, he is not vomiting.     Plan:  He has agreed to allow IR to try and place the NG. I am asking Medicine to see and will talk with our on call doctor to follow closely.  I have requested and EKG also.  Increase VS to q2h until BP is controlled.

## 2015-12-07 NOTE — ED Notes (Signed)
Attempted to place NG tube. Pt did not tolerate. Pt asked for some time to consider having RN try again.

## 2015-12-07 NOTE — ED Notes (Addendum)
Pt began drinking oral contrast.

## 2015-12-07 NOTE — Progress Notes (Addendum)
Patient still distended.  Good bowel sounds.  No bowel movement.  BP increased significantly.  Will hold on trying to get NGT tonight with hsi PB being elevated.  Will check again tomorrow.  Kathryne Eriksson. Dahlia Bailiff, MD, [place] [phone] [facility]

## 2015-12-07 NOTE — Progress Notes (Signed)
Admission nurse notified of new patient John [name]

## 2015-12-07 NOTE — ED Notes (Signed)
Attempted report x1. 

## 2015-12-07 NOTE — Telephone Encounter (Addendum)
Per Chart, pt in is the ED being evaluated and treated for a [name].

## 2015-12-07 NOTE — H&P (Signed)
John [name] is an 75 y.o. male.   PCP: Noralee Space, MD Urology;  [name]  Chief Complaint: Abdominal distention and progressive pain.  HPI: Patient's a well-nourished, well-developed, gentleman in relatively good health. He noted the onset of abdominal distention a little over 24 hours ago. He says is preceded by upper respiratory like symptoms with nasal congestion, possibly allergies. His distention became more progressive and became more and more painful. He's had nausea but no vomiting prior to being seen in the ED. He has never had a problem like this before. His only surgery, includes TURP, several cystoscopies, biopsies, with a history of prostate cancer. Treatment for his prostate cancer was holistic and the last biopsy showed no cancer.  On presentation to the ED he was hypertensive but afebrile. Labs show a creatinine of 1.6 for a glucose of 165 CBC was normal.  CT scan shows distended proximal and decompressed distal small bowel loops compatible with small bowel obstruction. Transition zone at the small bowel loop in the right mid abdomen unremarkable there's no definite bowel wall thickening upper normal caliber appendix in the right pelvis without inflammatory changes. This was consistent with a small bowel obstruction and transition zone in the right mid abdomen. He does have marked prostatic enlargement and bilateral renal cyst. Some atelectasis and a questionable infiltrate left lower lobe. We are asked to see. He has had a colonoscopy about 5 years ago, he's not sure who did it but it was at [facility].  Past Medical History:  Diagnosis Date  . Anxiety   . Glaucoma   . Gynecomastia   . Hemorrhoids   . Hx of adenomatous colonic polyps   . Hypercholesteremia   . Hyperplasia of prostate with urinary obstruction   . Hypertension NO MED--  WAS TAKES CADURA FOR  BOTH BP AND PROSTATE ---  WAS TOLD TO STOP BY DR [name]  PER WIFE  . Premature ventricular contractions   . Prostate  cancer ([place]) INITIAL DX STAGE T1c Nx Mx  ADENOCARCINOMA JAN 2003 (PT DECIDED TO PROCEED W/ SURVEILLANCE)--  FOLLOWED BY DR LES [name] , UROLOGIST   PER PT AND WIFE ---  healed with holistic meds--  BY [facility]  MD UNTIL  RECENT SYMPTOMS OF URINARY RETENTION  . Sleep apnea    no CPAP   . Urinary retention     Past Surgical History:  Procedure Laterality Date  . ARM SURGERY  AGE 40   patient denies at preop appt of 05/20/2014    . COLONOSCOPY    . CYSTOSCOPY  01/03/2012   Procedure: CYSTOSCOPY FLEXIBLE;  Surgeon: Dutch Gray, MD;  [facility];  Service: Urology;  Laterality: N/A;  . CYSTOSCOPY N/A 03/27/2012   Procedure: CYSTOSCOPY;  Surgeon: Dutch Gray, MD;  Location: [facility];  Service: Urology;  Laterality: N/A;  . CYSTOSCOPY N/A 10/04/2014   Procedure: CYSTOSCOPY FLEXIBLE;  Surgeon: Raynelle Bring, MD;  Location: [facility];  Service: Urology;  Laterality: N/A;  . POLYPECTOMY    . PROSTATE BIOPSY  02/2001   repeat in 11/2002 Dr. Lottie Rater at [facility]  . PROSTATE BIOPSY  01/03/2012   Procedure: BIOPSY TRANSRECTAL ULTRASONIC PROSTATE (TUBP);  Surgeon: Dutch Gray, MD;  Location: [facility];  Service: Urology;  Laterality: N/A;  45 MIN   . PROSTATE BIOPSY N/A 10/04/2014   Procedure: BIOPSY TRANSRECTAL ULTRASONIC PROSTATE (TUBP);  Surgeon: Raynelle Bring, MD;  Location: [facility];  Service: Urology;  Laterality: N/A;  . REFRACTIVE  SURGERY     for glaucoma   . TRANSURETHRAL RESECTION OF PROSTATE N/A 03/27/2012   Procedure: TRANSURETHRAL RESECTION OF THE PROSTATE WITH GYRUS INSTRUMENTS;  Surgeon: Dutch Gray, MD;  Location: [facility];  Service: Urology;  Laterality: N/A;    Family History  Problem Relation Age of Onset  . Heart disease Father   . Diabetes Father    Social History:  reports that he quit smoking about 46 years ago. His smoking use included Cigarettes. He has a 7.50 pack-year smoking history. He has never used smokeless tobacco. He reports that he does not  drink alcohol or use drugs.  Allergies:  Allergies  Allergen Reactions  . Amlodipine Besylate Other (See Comments)    REACTION: causes nightmares  . Tamsulosin Other (See Comments)     dizziness    Prior to Admission medications   Medication Sig Start Date End Date Taking? Authorizing Provider  acetaminophen (TYLENOL) 500 MG tablet Take 500-1,000 mg by mouth every 6 (six) hours as needed.   Yes Historical Provider, MD  bimatoprost (LUMIGAN) 0.01 % SOLN Place 1 drop into both eyes at bedtime.   Yes Historical Provider, MD  brimonidine (ALPHAGAN) 0.15 % ophthalmic solution Place 1 drop into both eyes 2 (two) times daily.  07/28/14  Yes Historical Provider, MD  COD LIVER OIL PO Take 1 tablet by mouth 2 (two) times a week.    Yes Historical Provider, MD  furosemide (LASIX) 20 MG tablet Take 1 tablet (20 mg total) by mouth daily as needed for edema. 11/16/15  Yes Noralee Space, MD  losartan (COZAAR) 100 MG tablet TAKE 1 TABLET BY MOUTH DAILY. 09/19/15  Yes Noralee Space, MD  polyvinyl alcohol (LIQUIFILM TEARS) 1.4 % ophthalmic solution Place 1 drop into both eyes 6 (six) times daily.   Yes Historical Provider, MD     Results for orders placed or performed during the hospital encounter of 12/07/15 (from the past 48 hour(s))  Lipase, blood     Status: None   Collection Time: 12/07/15 12:30 AM  Result Value Ref Range   Lipase 19 11 - 51 U/L  Comprehensive metabolic panel     Status: Abnormal   Collection Time: 12/07/15 12:30 AM  Result Value Ref Range   Sodium 139 135 - 145 mmol/L   Potassium 3.9 3.5 - 5.1 mmol/L   Chloride 105 101 - 111 mmol/L   CO2 22 22 - 32 mmol/L   Glucose, Bld 165 (H) 65 - 99 mg/dL   BUN 17 6 - 20 mg/dL   Creatinine, Ser 1.64 (H) 0.61 - 1.24 mg/dL   Calcium 9.3 8.9 - 10.3 mg/dL   Total Protein 7.3 6.5 - 8.1 g/dL   Albumin 3.9 3.5 - 5.0 g/dL   AST 22 15 - 41 U/L   ALT 15 (L) 17 - 63 U/L   Alkaline Phosphatase 72 38 - 126 U/L   Total Bilirubin 1.0 0.3 - 1.2 mg/dL    GFR calc non Af Amer 40 (L) >60 mL/min   GFR calc Af Amer 47 (L) >60 mL/min    Comment: (NOTE) The eGFR has been calculated using the CKD EPI equation. This calculation has not been validated in all clinical situations. eGFR's persistently <60 mL/min signify possible Chronic Kidney Disease.    Anion gap 12 5 - 15  CBC     Status: Abnormal   Collection Time: 12/07/15 12:30 AM  Result Value Ref Range   WBC 8.3 4.0 - 10.5  K/uL   RBC 5.41 4.22 - 5.81 MIL/uL   Hemoglobin 14.4 13.0 - 17.0 g/dL   HCT 40.8 39.0 - 52.0 %   MCV 75.4 (L) 78.0 - 100.0 fL   MCH 26.6 26.0 - 34.0 pg   MCHC 35.3 30.0 - 36.0 g/dL   RDW 14.5 11.5 - 15.5 %   Platelets 126 (L) 150 - 400 K/uL   Ct Abdomen Pelvis Wo Contrast  Result Date: 12/07/2015 CLINICAL DATA:  Severe pain near her umbilicus, difficulty with ambulation due to pain, nausea EXAM: CT ABDOMEN AND PELVIS WITHOUT CONTRAST TECHNIQUE: Multidetector CT imaging of the abdomen and pelvis was performed following the standard protocol without IV contrast. Sagittal and coronal MPR images reconstructed from axial data set. Patient drank dilute oral contrast for exam. COMPARISON:  05/20/2014 FINDINGS: Lower chest: Atelectasis and question infiltrate in RIGHT lower lobe. Minimal LEFT basilar atelectasis. Hepatobiliary: Unremarkable liver and gallbladder Pancreas: Normal appearance Spleen: Calcified granuloma.  Otherwise normal appearance. Adrenals/Urinary Tract: Adrenal glands normal appearance. BILATERAL renal cysts largest 7.7 x 7.1 cm inferior pole RIGHT kidney image 40 and 3.5 x 3.4 cm inferior pole LEFT kidney image 45. Bladder and ureters unremarkable. Marked prostatic enlargement, gland 6.6 x 6.2 x 5.8 cm. Stomach/Bowel: Stomach normal appearance. Distended proximal and decompressed distal small bowel loops compatible with small bowel obstruction. Transition zone at small bowel loop in the RIGHT mid abdomen image 48. Colon unremarkable. No definite bowel wall  thickening. Upper normal caliber appendix in RIGHT pelvis without inflammatory changes. Vascular/Lymphatic: Atherosclerotic calcification aorta without aneurysm. No adenopathy Reproductive: N/A Other: Tiny umbilical hernia containing fat. Small amount of free fluid perihepatic. No free air. Musculoskeletal: Unremarkable IMPRESSION: Mid small bowel obstruction with transition zone in RIGHT mid abdomen, favor adhesion. Small amount of perihepatic free fluid. Marked prostatic enlargement. BILATERAL renal cysts. Atelectasis and question mild infiltrate LEFT lower lobe. Electronically Signed   By: Lavonia Dana M.D.   On: 12/07/2015 07:18    Review of Systems  Constitutional: Negative.        URI with some nasal congestion/allery like symptoms  HENT: Negative.        Nasal congestion  Eyes: Negative.   Respiratory: Negative.   Cardiovascular: Negative.   Gastrointestinal: Positive for abdominal pain and nausea. Negative for vomiting.       Last BM 24-48 hours ago.  Genitourinary: Negative.   Musculoskeletal: Negative.   Skin: Negative.   Neurological: Negative.   Endo/Heme/Allergies: Negative.   Psychiatric/Behavioral: Negative.     Blood pressure (!) 180/104, pulse (!) 58, temperature 98.6 F (37 C), temperature source Oral, resp. rate 14, height _0  (1.905 m), weight 99.8 kg (220 lb), SpO2 97 %. Physical Exam  Constitutional: He is oriented to person, place, and time. He appears well-developed and well-nourished. No distress.  HENT:  Head: Normocephalic and atraumatic.  Nose: Nose normal.  Mouth/Throat: Oropharyngeal exudate (Some red appearing exudate on his tongue he says it's from the contrast) present.  Eyes: Right eye exhibits no discharge. Left eye exhibits no discharge. No scleral icterus.  Neck: Normal range of motion. Neck supple. No JVD present. No tracheal deviation present. No thyromegaly present.  Cardiovascular: Normal rate, regular rhythm, normal heart sounds and intact  distal pulses.   No murmur heard. Respiratory: Effort normal and breath sounds normal. No respiratory distress. He has no wheezes. He has no rales. He exhibits no tenderness.  GI: He exhibits distension. He exhibits no mass. There is tenderness. There is no  rebound and no guarding.  Abdomen is distended and generally tender. He seems most uncomfortable in the right upper quadrant with palpation. For the most part a generalized tenderness. Bowel sounds are present and hyperactive.  Musculoskeletal: He exhibits no edema, tenderness or deformity.  Lymphadenopathy:    He has no cervical adenopathy.  Neurological: He is alert and oriented to person, place, and time. No cranial nerve deficit.  Skin: Skin is warm and dry. No rash noted. He is not diaphoretic. No erythema. No pallor.  Psychiatric: He has a normal mood and affect. His behavior is normal. Judgment and thought content normal.     Assessment/Plan [name], with no prior abdominal surgical history. No prior history of [name]. History of prostate cancer with holistic treatment. Status post TURP Hypertension Glaucoma History of sleep apnea - no CPAP Lateral renal cyst   Plan: Admit the patient and insert an NG tube for bowel decompression, hydrate with IV fluids, bowel rest. Small bowel protocol later today.   Grey Schlauch, PA-C 12/07/2015, 7:34 AM

## 2015-12-07 NOTE — Telephone Encounter (Signed)
Pt wife calling in to let Dr Lenna Gilford know that the patient is in the hospital for a [name] - see telephone note from 12/06/15 where Dr Oletta Darter spoke with the patient. Called and left a detailed message on Brenda's personalized voicemail letting her know that I am sending this over to Dr Lenna Gilford to make him aware.

## 2015-12-07 NOTE — ED Triage Notes (Signed)
Patient reports generalized abdominal pain with nausea onset this evening , denies emesis or diarrhea , no fever or chills. Last BM this afternoon .

## 2015-12-07 NOTE — Consult Note (Signed)
Medical Consultation   CYPRUS OSKEY  [id number]  DOB: 07/06/1940  DOA: 12/07/2015  PCP: Noralee Space, MD    Requesting physician: Surgery team    Reason for consultation: Uncontrolled hypertension   History of Present Illness: John [name] is an 75 y.o. male who was admitted to the surgical service today for a diagnosis of small bowel obstruction, patient has been placed on nothing by mouth, hydromorphone IV for pain control. Over the course of the day his blood pressure has become uncontrolled with systolic between [id number] and [id number]. Reports having abdominal pain, 8 out of 10 in intensity, dull in nature, no radiation, improved with pain medication, no worsening factors, no associated nausea or vomiting. Patient took his antihypertensive agents yesterday, losartan. His blood pressure has been uncontrolled as an outpatient.   Review of Systems:  ROS 1. General. Positive for malaise and weakness. 2. ENT no runny nose or sore throat 3. Cardiovascular: No angina, no claudication, no chest pain. 4. Pulmonary. No shortness of breath, cough or hemoptysis 5. Muscolskeletal No joint pain 6. Dermatology no rashes 7. Urology no dysuria or increased urinary frequency 8. Hematology no easy bruisability or frequent infections 9. Endocrine a tremors, heat or cold intolerance 10. Psych no depression or anxiety  Past Medical History: Past Medical History:  Diagnosis Date  . Anxiety   . Glaucoma   . Gynecomastia   . Hemorrhoids   . Hx of adenomatous colonic polyps   . Hypercholesteremia   . Hyperplasia of prostate with urinary obstruction   . Hypertension NO MED--  WAS TAKES CADURA FOR  BOTH BP AND PROSTATE ---  WAS TOLD TO STOP BY DR [name]  PER WIFE  . Premature ventricular contractions   . Prostate cancer ([place]) INITIAL DX STAGE T1c Nx Mx  ADENOCARCINOMA JAN 2003 (PT DECIDED TO PROCEED W/ SURVEILLANCE)--  FOLLOWED BY DR LES [name] , UROLOGIST   PER PT AND WIFE ---   healed with holistic meds--  BY [facility]  MD UNTIL  RECENT SYMPTOMS OF URINARY RETENTION  . Sleep apnea    "doesn't wear mask" (12/07/2015)  . Urinary retention     Past Surgical History: Past Surgical History:  Procedure Laterality Date  . ARM SURGERY  AGE 59   patient denies at preop appt of 05/20/2014    . COLONOSCOPY W/ BIOPSIES AND POLYPECTOMY  ~ 2002  . CYSTOSCOPY  01/03/2012   Procedure: CYSTOSCOPY FLEXIBLE;  Surgeon: Dutch Gray, MD;  Location: [facility];  Service: Urology;  Laterality: N/A;  . CYSTOSCOPY N/A 03/27/2012   Procedure: CYSTOSCOPY;  Surgeon: Dutch Gray, MD;  Location: [facility];  Service: Urology;  Laterality: N/A;  . CYSTOSCOPY N/A 10/04/2014   Procedure: CYSTOSCOPY FLEXIBLE;  Surgeon: Raynelle Bring, MD;  Location: [facility];  Service: Urology;  Laterality: N/A;  . PROSTATE BIOPSY  02/2001   repeat in 11/2002 Dr. Lottie Rater at [facility]  . PROSTATE BIOPSY  01/03/2012   Procedure: BIOPSY TRANSRECTAL ULTRASONIC PROSTATE (TUBP);  Surgeon: Dutch Gray, MD;  Location: [facility];  Service: Urology;  Laterality: N/A;  45 MIN   . PROSTATE BIOPSY N/A 10/04/2014   Procedure: BIOPSY TRANSRECTAL ULTRASONIC PROSTATE (TUBP);  Surgeon: Raynelle Bring, MD;  Location: [facility];  Service: Urology;  Laterality: N/A;  . REFRACTIVE SURGERY     for glaucoma   . TRANSURETHRAL RESECTION OF PROSTATE N/A 03/27/2012   Procedure:  TRANSURETHRAL RESECTION OF THE PROSTATE WITH GYRUS INSTRUMENTS;  Surgeon: Dutch Gray, MD;  Location: [facility];  Service: Urology;  Laterality: N/A;     Allergies:   Allergies  Allergen Reactions  . Amlodipine Besylate Other (See Comments)    REACTION: causes nightmares  . Tamsulosin Other (See Comments)     dizziness     Social History:  reports that he quit smoking about 46 years ago. His smoking use included Cigarettes. He has a 7.50 pack-year smoking history. He has never used smokeless tobacco. He reports that he does not drink alcohol or  use drugs.   Family History: Family History  Problem Relation Age of Onset  . Heart disease Father   . Diabetes Father       Physical Exam: Vitals:   12/07/15 0900 12/07/15 0942 12/07/15 1132 12/07/15 1604  BP: (!) 169/108 (!) 203/91 (!) 170/92 (!) 200/100  Pulse: 65 63  (!) 50  Resp:  19    Temp:  97.7 F (36.5 C)  97.7 F (36.5 C)  TempSrc:  Oral  Oral  SpO2: 93% 98%  97%  Weight:      Height:        Constitutional: Patient in pain,  Alert and awake, oriented x3, not in any acute distress. Eyes: PERLA, EOMI, irises appear normal, anicteric sclera,  ENMT: external ears and nose appear normal, normal hearing or hard of hearing            Lips appears normal, oropharynx mucosa, tongue, posterior pharynx appear normal  Neck: neck appears normal, no masses, normal ROM, no thyromegaly, no JVD  CVS: S1-S2 clear, no murmur rubs or gallops, no LE edema, normal pedal pulses  Respiratory:  clear to auscultation bilaterally, no wheezing, rales or rhonchi. Respiratory effort normal. No accessory muscle use.  Abdomen: soft, mild tender to deep palpation,  Mild distended, normal bowel sounds, no hepatosplenomegaly, no hernias  Musculoskeletal: : no cyanosis, clubbing or edema noted bilaterally Neuro: Cranial nerves II-XII intact, strength, sensation, reflexes Skin: no rashes or lesions or ulcers, no induration or nodules    Data reviewed:  I have personally reviewed following labs and imaging studies Labs:  CBC:  Recent Labs Lab 12/07/15 0030  WBC 8.3  HGB 14.4  HCT 40.8  MCV 75.4*  PLT 126*    Basic Metabolic Panel:  Recent Labs Lab 12/07/15 0030  NA 139  K 3.9  CL 105  CO2 22  GLUCOSE 165*  BUN 17  CREATININE 1.64*  CALCIUM 9.3   GFR Estimated Creatinine Clearance: 48.7 mL/min (by C-G formula based on SCr of 1.64 mg/dL (H)). Liver Function Tests:  Recent Labs Lab 12/07/15 0030  AST 22  ALT 15*  ALKPHOS 72  BILITOT 1.0  PROT 7.3  ALBUMIN 3.9     Recent Labs Lab 12/07/15 0030  LIPASE 19   No results for input(s): AMMONIA in the last 168 hours. Coagulation profile No results for input(s): INR, PROTIME in the last 168 hours.  Cardiac Enzymes: No results for input(s): CKTOTAL, CKMB, CKMBINDEX, TROPONINI in the last 168 hours. BNP: Invalid input(s): POCBNP CBG: No results for input(s): GLUCAP in the last 168 hours. D-Dimer No results for input(s): DDIMER in the last 72 hours. Hgb A1c No results for input(s): HGBA1C in the last 72 hours. Lipid Profile No results for input(s): CHOL, HDL, LDLCALC, TRIG, CHOLHDL, LDLDIRECT in the last 72 hours. Thyroid function studies No results for input(s): TSH, T4TOTAL, T3FREE, THYROIDAB in the  last 72 hours.  Invalid input(s): FREET3 Anemia work up No results for input(s): VITAMINB12, FOLATE, FERRITIN, TIBC, IRON, RETICCTPCT in the last 72 hours. Urinalysis    Component Value Date/Time   COLORURINE YELLOW 09/08/2013 1039   APPEARANCEUR CLEAR 09/08/2013 1039   LABSPEC 1.020 09/08/2013 1039   PHURINE 6.0 09/08/2013 1039   GLUCOSEU NEGATIVE 09/08/2013 1039   HGBUR NEGATIVE 09/08/2013 1039   BILIRUBINUR NEGATIVE 09/08/2013 1039   KETONESUR NEGATIVE 09/08/2013 1039   PROTEINUR 100 (A) 09/06/2011 2109   UROBILINOGEN 0.2 09/08/2013 1039   NITRITE NEGATIVE 09/08/2013 1039   LEUKOCYTESUR NEGATIVE 09/08/2013 1039     Microbiology No results found for this or any previous visit (from the past 240 hour(s)).     Inpatient Medications:   Scheduled Meds: . brimonidine  1 drop Both Eyes BID  . heparin  5,000 Units Subcutaneous Q8H  . latanoprost  1 drop Both Eyes QHS  . polyvinyl alcohol  1 drop Both Eyes 6 X Daily  . sodium chloride  500 mL Intravenous Once   Continuous Infusions: . sodium chloride 125 mL/hr at 12/07/15 1337     Radiological Exams on Admission: Ct Abdomen Pelvis Wo Contrast  Result Date: 12/07/2015 CLINICAL DATA:  Severe pain near her umbilicus,  difficulty with ambulation due to pain, nausea EXAM: CT ABDOMEN AND PELVIS WITHOUT CONTRAST TECHNIQUE: Multidetector CT imaging of the abdomen and pelvis was performed following the standard protocol without IV contrast. Sagittal and coronal MPR images reconstructed from axial data set. Patient drank dilute oral contrast for exam. COMPARISON:  05/20/2014 FINDINGS: Lower chest: Atelectasis and question infiltrate in RIGHT lower lobe. Minimal LEFT basilar atelectasis. Hepatobiliary: Unremarkable liver and gallbladder Pancreas: Normal appearance Spleen: Calcified granuloma.  Otherwise normal appearance. Adrenals/Urinary Tract: Adrenal glands normal appearance. BILATERAL renal cysts largest 7.7 x 7.1 cm inferior pole RIGHT kidney image 40 and 3.5 x 3.4 cm inferior pole LEFT kidney image 45. Bladder and ureters unremarkable. Marked prostatic enlargement, gland 6.6 x 6.2 x 5.8 cm. Stomach/Bowel: Stomach normal appearance. Distended proximal and decompressed distal small bowel loops compatible with small bowel obstruction. Transition zone at small bowel loop in the RIGHT mid abdomen image 48. Colon unremarkable. No definite bowel wall thickening. Upper normal caliber appendix in RIGHT pelvis without inflammatory changes. Vascular/Lymphatic: Atherosclerotic calcification aorta without aneurysm. No adenopathy Reproductive: N/A Other: Tiny umbilical hernia containing fat. Small amount of free fluid perihepatic. No free air. Musculoskeletal: Unremarkable IMPRESSION: Mid small bowel obstruction with transition zone in RIGHT mid abdomen, favor adhesion. Small amount of perihepatic free fluid. Marked prostatic enlargement. BILATERAL renal cysts. Atelectasis and question mild infiltrate LEFT lower lobe. Electronically Signed   By: Lavonia Dana M.D.   On: 12/07/2015 07:18   Dg Abd 2 Views  Result Date: 12/07/2015 CLINICAL DATA:  Hervey Ard left lower abdominal pain for 2 days. EXAM: ABDOMEN - 2 VIEW COMPARISON:  CT earlier today.  FINDINGS: Dilated small bowel loops with air-fluid levels compatible with small bowel obstruction as seen on prior CT. No free air. No organomegaly or suspicious calcification. IMPRESSION: Small bowel obstruction. Electronically Signed   By: Rolm Baptise M.D.   On: 12/07/2015 15:56    Impression/Recommendations Active Problems:   [name] (small bowel obstruction)  This is a 75 year old gentleman with poorly controlled hypertension who was admitted for small bowel obstruction, he has been placed on nothing by mouth, IV fluids and IV hydromorphone. His blood pressure has been elevated reaching a peak of A999333 systolic. Patient denies any  chest pain or dyspnea. On initial physical examination patient's blood pressure is 188/80 after IV hydralazine 10 mg. His oral mucosae is dry, his lungs are clear to auscultation, heart S1-S2 present rhythmic, his abdomen is mildly distended and tender to palpation. No lower extremity edema. Sodium 139, potassium 3.9, creatinine 1.64, BUN 17, glucose 165, white count 8.3, hemoglobin 14.4, hematocrit 40.8, platelets 126. Abdominal films personally review showing air-fluid levels. EKG from 2016 showed normal sinus rhythm with no LVH. Personally reviewed.  Working diagnosis uncontrolled hypertension.  1. Uncontrolled hypertension. Patient seems to be responding to IV hydralazine. Will continue vasodilator therapy every 4 hours as needed for blood pressure systolic greater than [id number]. If persistent hypertension patient will need a nicardipine drip. Continue supportive care with IV fluids and pain control.  2. Chronic kidney disease stage 3. Will continue supportive care with IV fluids, currently patient is nothing by mouth. Follow-up kidney function in the morning, avoid hypotension and nephrotoxic agents. K 3.9 with a serum bicarbonate of 22.   3. Small bowel obstruction. Continue care per primary team.   Thank you for this consultation.  Our [facility] hospitalist team will follow the  patient with you.    Braun Rocca Gerome Apley M.D. [facility] 12/07/2015, 5:48 PM

## 2015-12-07 NOTE — ED Provider Notes (Signed)
[facility] DEPT Provider Note   CSN: [id number] Arrival date & time: 12/07/15  0007  By signing my name below, I, Delton Prairie, attest that this documentation has been prepared under the direction and in the presence of Merryl Hacker, MD  Electronically Signed: Delton Prairie, ED Scribe. 12/07/15 . 2:58 AM.   History   Chief Complaint Chief Complaint  Patient presents with  . Abdominal Pain   The history is provided by the patient. No language interpreter was used.   HPI Comments:  John [name] is a 75 y.o. male who presents to the Emergency Department complaining of constant "9/10" sharp abdominal pain x 1 day. Pt notes associated nausea. He denies vomiting, diarrhea, fevers, dysuria , other urinary symptoms, hx of kidney disease, lung disease or lung disease. Pt denies exacerbating factors. No alleviating factors noted. Pt denies any other complaints at this time.  Past Medical History:  Diagnosis Date  . Anxiety   . Glaucoma   . Gynecomastia   . Hemorrhoids   . Hx of adenomatous colonic polyps   . Hypercholesteremia   . Hyperplasia of prostate with urinary obstruction   . Hypertension NO MED--  WAS TAKES CADURA FOR  BOTH BP AND PROSTATE ---  WAS TOLD TO STOP BY DR [name]  PER WIFE  . Premature ventricular contractions   . Prostate cancer ([place]) INITIAL DX STAGE T1c Nx Mx  ADENOCARCINOMA JAN 2003 (PT DECIDED TO PROCEED W/ SURVEILLANCE)--  FOLLOWED BY DR LES [name] , UROLOGIST   PER PT AND WIFE ---  healed with holistic meds--  BY [facility]  MD UNTIL  RECENT SYMPTOMS OF URINARY RETENTION  . Sleep apnea    no CPAP   . Urinary retention     Patient Active Problem List   Diagnosis Date Noted  . Chronic venous insufficiency 09/16/2014  . Edema 09/16/2014  . Abnormal EKG 08/26/2014  . Preoperative cardiovascular examination 08/26/2014  . Leg pain, left 02/23/2014  . Obstructive uropathy 10/17/2011  . UTI (urinary tract infection) 10/17/2011  . Constipation  10/17/2011  . Obstruction to urinary outflow 12/20/2010  . Sickle cell trait ([place]) 06/21/2010  . HYPERCHOLESTEROLEMIA, MILD 09/02/2008  . MUSCLE SPASM, BACK 09/02/2008  . Essential hypertension 01/22/2008  . Cardiac arrhythmia 01/22/2008  . PROSTATE CANCER 11/22/2006  . ADENOMATOUS COLONIC POLYP 11/22/2006  . Anxiety state 11/22/2006  . HEMORRHOIDS 11/22/2006  . IRRITABLE BOWEL SYNDROME 11/22/2006  . GYNECOMASTIA 11/22/2006    Past Surgical History:  Procedure Laterality Date  . ARM SURGERY  AGE 71   patient denies at preop appt of 05/20/2014    . COLONOSCOPY    . CYSTOSCOPY  01/03/2012   Procedure: CYSTOSCOPY FLEXIBLE;  Surgeon: Dutch Gray, MD;  Location: [facility];  Service: Urology;  Laterality: N/A;  . CYSTOSCOPY N/A 03/27/2012   Procedure: CYSTOSCOPY;  Surgeon: Dutch Gray, MD;  Location: [facility];  Service: Urology;  Laterality: N/A;  . CYSTOSCOPY N/A 10/04/2014   Procedure: CYSTOSCOPY FLEXIBLE;  Surgeon: Raynelle Bring, MD;  Location: [facility];  Service: Urology;  Laterality: N/A;  . POLYPECTOMY    . PROSTATE BIOPSY  02/2001   repeat in 11/2002 Dr. Lottie Rater at [facility]  . PROSTATE BIOPSY  01/03/2012   Procedure: BIOPSY TRANSRECTAL ULTRASONIC PROSTATE (TUBP);  Surgeon: Dutch Gray, MD;  Location: [facility];  Service: Urology;  Laterality: N/A;  45 MIN   . PROSTATE BIOPSY N/A 10/04/2014   Procedure: BIOPSY TRANSRECTAL ULTRASONIC PROSTATE (TUBP);  Surgeon:  Raynelle Bring, MD;  Location: [facility];  Service: Urology;  Laterality: N/A;  . REFRACTIVE SURGERY     for glaucoma   . TRANSURETHRAL RESECTION OF PROSTATE N/A 03/27/2012   Procedure: TRANSURETHRAL RESECTION OF THE PROSTATE WITH GYRUS INSTRUMENTS;  Surgeon: Dutch Gray, MD;  Location: [facility];  Service: Urology;  Laterality: N/A;       Home Medications    Prior to Admission medications   Medication Sig Start Date End Date Taking? Authorizing Provider  acetaminophen (TYLENOL) 500 MG tablet Take 500-1,000  mg by mouth every 6 (six) hours as needed.   Yes Historical Provider, MD  bimatoprost (LUMIGAN) 0.01 % SOLN Place 1 drop into both eyes at bedtime.   Yes Historical Provider, MD  brimonidine (ALPHAGAN) 0.15 % ophthalmic solution Place 1 drop into both eyes 2 (two) times daily.  07/28/14  Yes Historical Provider, MD  COD LIVER OIL PO Take 1 tablet by mouth 2 (two) times a week.    Yes Historical Provider, MD  furosemide (LASIX) 20 MG tablet Take 1 tablet (20 mg total) by mouth daily as needed for edema. 11/16/15  Yes Noralee Space, MD  losartan (COZAAR) 100 MG tablet TAKE 1 TABLET BY MOUTH DAILY. 09/19/15  Yes Noralee Space, MD  polyvinyl alcohol (LIQUIFILM TEARS) 1.4 % ophthalmic solution Place 1 drop into both eyes 6 (six) times daily.   Yes Historical Provider, MD    Family History Family History  Problem Relation Age of Onset  . Heart disease Father   . Diabetes Father     Social History Social History  Substance Use Topics  . Smoking status: Former Smoker    Packs/day: 0.50    Years: 15.00    Types: Cigarettes    Quit date: 02/12/1969  . Smokeless tobacco: Never Used  . Alcohol use No     Comment: quit in 1980     Allergies   Amlodipine besylate and Tamsulosin   Review of Systems Review of Systems  Constitutional: Negative for fever.  Respiratory: Negative for shortness of breath.   Cardiovascular: Negative for chest pain.  Gastrointestinal: Positive for abdominal pain and nausea. Negative for diarrhea and vomiting.  Genitourinary: Negative for dysuria.  All other systems reviewed and are negative.    Physical Exam Updated Vital Signs BP (!) 180/104   Pulse (!) 58   Temp 98.6 F (37 C) (Oral)   Resp 14   Ht 6\' 3"  (1.905 m)   Wt 220 lb (99.8 kg)   SpO2 97%   BMI 27.50 kg/m   Physical Exam  Constitutional: He is oriented to person, place, and time. He appears well-developed and well-nourished.  Uncomfortable appearing, no acute distress  HENT:  Head:  Normocephalic and atraumatic.  Cardiovascular: Normal rate, regular rhythm and normal heart sounds.   No murmur heard. Pulmonary/Chest: Effort normal and breath sounds normal. No respiratory distress. He has no wheezes.  Abdominal: Soft. Bowel sounds are normal. He exhibits distension. There is tenderness. There is no rebound and no guarding.  Mild distention with diffuse tenderness to palpation, hyperactive bowel sounds  Musculoskeletal: He exhibits no edema.  Neurological: He is alert and oriented to person, place, and time.  Skin: Skin is warm and dry.  Psychiatric: He has a normal mood and affect.  Nursing note and vitals reviewed.    ED Treatments / Results  DIAGNOSTIC STUDIES:  Oxygen Saturation is 99% on RA, normal by my interpretation.    COORDINATION OF CARE:  2:56 AM Discussed treatment plan with pt at bedside and pt agreed to plan.  Labs (all labs ordered are listed, but only abnormal results are displayed) Labs Reviewed  COMPREHENSIVE METABOLIC PANEL - Abnormal; Notable for the following:       Result Value   Glucose, Bld 165 (*)    Creatinine, Ser 1.64 (*)    ALT 15 (*)    GFR calc non Af Amer 40 (*)    GFR calc Af Amer 47 (*)    All other components within normal limits  CBC - Abnormal; Notable for the following:    MCV 75.4 (*)    Platelets 126 (*)    All other components within normal limits  LIPASE, BLOOD  URINALYSIS, ROUTINE W REFLEX MICROSCOPIC (NOT AT [facility])    EKG  EKG Interpretation None       Radiology Ct Abdomen Pelvis Wo Contrast  Result Date: 12/07/2015 CLINICAL DATA:  Severe pain near her umbilicus, difficulty with ambulation due to pain, nausea EXAM: CT ABDOMEN AND PELVIS WITHOUT CONTRAST TECHNIQUE: Multidetector CT imaging of the abdomen and pelvis was performed following the standard protocol without IV contrast. Sagittal and coronal MPR images reconstructed from axial data set. Patient drank dilute oral contrast for exam. COMPARISON:   05/20/2014 FINDINGS: Lower chest: Atelectasis and question infiltrate in RIGHT lower lobe. Minimal LEFT basilar atelectasis. Hepatobiliary: Unremarkable liver and gallbladder Pancreas: Normal appearance Spleen: Calcified granuloma.  Otherwise normal appearance. Adrenals/Urinary Tract: Adrenal glands normal appearance. BILATERAL renal cysts largest 7.7 x 7.1 cm inferior pole RIGHT kidney image 40 and 3.5 x 3.4 cm inferior pole LEFT kidney image 45. Bladder and ureters unremarkable. Marked prostatic enlargement, gland 6.6 x 6.2 x 5.8 cm. Stomach/Bowel: Stomach normal appearance. Distended proximal and decompressed distal small bowel loops compatible with small bowel obstruction. Transition zone at small bowel loop in the RIGHT mid abdomen image 48. Colon unremarkable. No definite bowel wall thickening. Upper normal caliber appendix in RIGHT pelvis without inflammatory changes. Vascular/Lymphatic: Atherosclerotic calcification aorta without aneurysm. No adenopathy Reproductive: N/A Other: Tiny umbilical hernia containing fat. Small amount of free fluid perihepatic. No free air. Musculoskeletal: Unremarkable IMPRESSION: Mid small bowel obstruction with transition zone in RIGHT mid abdomen, favor adhesion. Small amount of perihepatic free fluid. Marked prostatic enlargement. BILATERAL renal cysts. Atelectasis and question mild infiltrate LEFT lower lobe. Electronically Signed   By: Lavonia Dana M.D.   On: 12/07/2015 07:18    Procedures Procedures (including critical care time)  Medications Ordered in ED Medications  iopamidol (ISOVUE-300) 61 % injection 15 mL (15 mLs Oral Contrast Given 12/07/15 0317)  iopamidol (ISOVUE-300) 61 % injection (not administered)  morphine 4 MG/ML injection 4 mg (4 mg Intravenous Given 12/07/15 0311)  ondansetron (ZOFRAN) injection 4 mg (4 mg Intravenous Given 12/07/15 0311)  morphine 4 MG/ML injection 4 mg (4 mg Intravenous Given 12/07/15 0630)  ondansetron (ZOFRAN) injection 4  mg (4 mg Intravenous Given 12/07/15 [id number])     Initial Impression / Assessment and Plan / ED Course  I have reviewed the triage vital signs and the nursing notes.  Pertinent labs & imaging results that were available during my care of the patient were reviewed by me and considered in my medical decision making (see chart for details).  Clinical Course    Patient presents with abdominal pain. Pain is nonfocal. He has mild distention and hyperactive bowel sounds. No prior abdominal surgeries. Patient was given pain and nausea medication. Lab work obtained and largely reassuring.  Given physical exam, CT scan was obtained. CT concerning for small bowel structure with transition point. Will discuss with general surgery and admit to medicine.    Final Clinical Impressions(s) / ED Diagnoses   Final diagnoses:  [name] (small bowel obstruction)    New Prescriptions New Prescriptions   No medications on file   I personally performed the services described in this documentation, which was scribed in my presence. The recorded information has been reviewed and is accurate.     Merryl Hacker, MD 12/07/15 [phone]

## 2015-12-08 ENCOUNTER — Inpatient Hospital Stay (HOSPITAL_COMMUNITY): Payer: Medicare Other

## 2015-12-08 LAB — BASIC METABOLIC PANEL
ANION GAP: 8 (ref 5–15)
BUN: 21 mg/dL — ABNORMAL HIGH (ref 6–20)
CHLORIDE: 105 mmol/L (ref 101–111)
CO2: 27 mmol/L (ref 22–32)
CREATININE: 1.46 mg/dL — AB (ref 0.61–1.24)
Calcium: 8.6 mg/dL — ABNORMAL LOW (ref 8.9–10.3)
GFR calc non Af Amer: 46 mL/min — ABNORMAL LOW (ref >60)
GFR, EST AFRICAN AMERICAN: 54 mL/min — AB (ref >60)
Glucose, Bld: 119 mg/dL — ABNORMAL HIGH (ref 65–99)
POTASSIUM: 3.7 mmol/L (ref 3.5–5.1)
SODIUM: 140 mmol/L (ref 135–145)

## 2015-12-08 LAB — CBC
HCT: 41.7 % (ref 39.0–52.0)
Hemoglobin: 14.7 g/dL (ref 13.0–17.0)
MCH: 26.9 pg (ref 26.0–34.0)
MCHC: 35.3 g/dL (ref 30.0–36.0)
MCV: 76.2 fL — AB (ref 78.0–100.0)
PLATELETS: 125 10*3/uL — AB (ref 150–400)
RBC: 5.47 MIL/uL (ref 4.22–5.81)
RDW: 14.6 % (ref 11.5–15.5)
WBC: 7.8 10*3/uL (ref 4.0–10.5)

## 2015-12-08 MED ORDER — DIPHENHYDRAMINE HCL 50 MG/ML IJ SOLN
25.0000 mg | Freq: Four times a day (QID) | INTRAMUSCULAR | Status: DC | PRN
  Administered 2015-12-08 – 2015-12-09 (×2): 25 mg via INTRAVENOUS
  Filled 2015-12-08 (×2): qty 1

## 2015-12-08 MED ORDER — CLONIDINE HCL 0.1 MG/24HR TD PTWK
0.1000 mg | MEDICATED_PATCH | TRANSDERMAL | Status: DC
  Administered 2015-12-08 – 2015-12-15 (×2): 0.1 mg via TRANSDERMAL
  Filled 2015-12-08 (×3): qty 1

## 2015-12-08 MED ORDER — SODIUM CHLORIDE 0.9 % IV SOLN
12.5000 mg | Freq: Three times a day (TID) | INTRAVENOUS | Status: DC | PRN
  Filled 2015-12-08 (×3): qty 0.5

## 2015-12-08 MED ORDER — PHENOL 1.4 % MT LIQD
1.0000 | OROMUCOSAL | Status: DC | PRN
  Administered 2015-12-09 – 2015-12-11 (×3): 1 via OROMUCOSAL
  Filled 2015-12-08: qty 177

## 2015-12-08 MED ORDER — MORPHINE SULFATE (PF) 2 MG/ML IV SOLN
1.0000 mg | INTRAVENOUS | Status: DC | PRN
  Administered 2015-12-08 (×2): 4 mg via INTRAVENOUS
  Administered 2015-12-09 – 2015-12-15 (×12): 2 mg via INTRAVENOUS
  Filled 2015-12-08: qty 2
  Filled 2015-12-08 (×11): qty 1
  Filled 2015-12-08: qty 2
  Filled 2015-12-08: qty 1

## 2015-12-08 MED ORDER — FAMOTIDINE IN NACL 20-0.9 MG/50ML-% IV SOLN
20.0000 mg | Freq: Two times a day (BID) | INTRAVENOUS | Status: DC
  Administered 2015-12-08 – 2015-12-14 (×13): 20 mg via INTRAVENOUS
  Filled 2015-12-08 (×16): qty 50

## 2015-12-08 MED ORDER — DIATRIZOATE MEGLUMINE & SODIUM 66-10 % PO SOLN
90.0000 mL | Freq: Once | ORAL | Status: AC
  Administered 2015-12-08: 90 mL via ORAL
  Filled 2015-12-08 (×2): qty 90

## 2015-12-08 NOTE — Progress Notes (Signed)
PROGRESS NOTE    John [name]  [id number] DOB: 01-13-1941 DOA: 12/07/2015 PCP: Noralee Space, MD   Brief Narrative: John [name] is an 75 y.o. male who was admitted to the surgical service today for a diagnosis of small bowel obstruction, patient has been placed on nothing by mouth, hydromorphone IV for pain control. Over the course of the day his blood pressure has become uncontrolled with systolic between [id number] and [id number]. Reports having abdominal pain, 8 out of 10 in intensity, dull in nature, no radiation, improved with pain medication, no worsening factors, no associated nausea or vomiting. Patient took his antihypertensive agents yesterday, losartan. His blood pressure has been uncontrolled as an outpatient.    Assessment & Plan:   Active Problems:   Essential hypertension   Sickle cell trait (HCC)   [name] (small bowel obstruction)  1-HTN, uncontrolled. Unable to use oral home regimen, patient is not tolerating oral medications.  Continue with PRN hydralazine.  Start clonidine patch.   2-[name]; per surgery. NG tube to be place.  SBP protocol.  IV Pepcid, GI prophylaxis.   3-hiccup; thorazine PRN   4-Chronic kidney disease stage 3;  Continue with IV fluids.  Cr last year 1.4--1.6  5-Thrombocytopenia; follow trend.   Thank you for this consultation.  Our [facility] hospitalist team will follow the patient with you.  DVT prophylaxis: heparin  Code Status: full code.  Family Communication: care discussed with patient.  Disposition Plan: remain inpatient     Procedures:   none   Antimicrobials:  none  Subjective: Still with significant abdominal distension, discomfort.    Objective: Vitals:   12/08/15 0240 12/08/15 0322 12/08/15 0400 12/08/15 0915  BP: (!) 195/105 (!) 192/104 (!) 163/77 (!) 157/88  Pulse: (!) 59   89  Resp: 19     Temp: 97.7 F (36.5 C)   98.1 F (36.7 C)  TempSrc:    Oral  SpO2: 96%   98%  Weight:      Height:        Intake/Output  Summary (Last 24 hours) at 12/08/15 1422 Last data filed at 12/08/15 1000  Gross per 24 hour  Intake             2000 ml  Output              825 ml  Net             1175 ml   Filed Weights   12/07/15 0021  Weight: 99.8 kg (220 lb)    Examination:  General exam: Appears calm and comfortable  Respiratory system: Clear to auscultation. Respiratory effort normal. Cardiovascular system: S1 & S2 heard, RRR. No JVD, murmurs, rubs, gallops or clicks. No pedal edema. Gastrointestinal system: Abdomen is distended, soft and mild tender. No organomegaly or masses felt. Normal bowel sounds heard. Central nervous system: Alert and oriented. No focal neurological deficits. Extremities: Symmetric 5 x 5 power. Skin: No rashes, lesions or ulcers Psychiatry: Judgement and insight appear normal. Mood & affect appropriate.     Data Reviewed: I have personally reviewed following labs and imaging studies  CBC:  Recent Labs Lab 12/07/15 0030 12/08/15 0409  WBC 8.3 7.8  HGB 14.4 14.7  HCT 40.8 41.7  MCV 75.4* 76.2*  PLT 126* 0000000*   Basic Metabolic Panel:  Recent Labs Lab 12/07/15 0030 12/08/15 0409  NA 139 140  K 3.9 3.7  CL 105 105  CO2 22 27  GLUCOSE 165* 119*  BUN  17 21*  CREATININE 1.64* 1.46*  CALCIUM 9.3 8.6*   GFR: Estimated Creatinine Clearance: 54.7 mL/min (by C-G formula based on SCr of 1.46 mg/dL (H)). Liver Function Tests:  Recent Labs Lab 12/07/15 0030  AST 22  ALT 15*  ALKPHOS 72  BILITOT 1.0  PROT 7.3  ALBUMIN 3.9    Recent Labs Lab 12/07/15 0030  LIPASE 19   No results for input(s): AMMONIA in the last 168 hours. Coagulation Profile: No results for input(s): INR, PROTIME in the last 168 hours. Cardiac Enzymes: No results for input(s): CKTOTAL, CKMB, CKMBINDEX, TROPONINI in the last 168 hours. BNP (last 3 results) No results for input(s): PROBNP in the last 8760 hours. HbA1C: No results for input(s): HGBA1C in the last 72 hours. CBG: No  results for input(s): GLUCAP in the last 168 hours. Lipid Profile: No results for input(s): CHOL, HDL, LDLCALC, TRIG, CHOLHDL, LDLDIRECT in the last 72 hours. Thyroid Function Tests: No results for input(s): TSH, T4TOTAL, FREET4, T3FREE, THYROIDAB in the last 72 hours. Anemia Panel: No results for input(s): VITAMINB12, FOLATE, FERRITIN, TIBC, IRON, RETICCTPCT in the last 72 hours. Sepsis Labs: No results for input(s): PROCALCITON, LATICACIDVEN in the last 168 hours.  No results found for this or any previous visit (from the past 240 hour(s)).       Radiology Studies: Ct Abdomen Pelvis Wo Contrast  Result Date: 12/07/2015 CLINICAL DATA:  Severe pain near her umbilicus, difficulty with ambulation due to pain, nausea EXAM: CT ABDOMEN AND PELVIS WITHOUT CONTRAST TECHNIQUE: Multidetector CT imaging of the abdomen and pelvis was performed following the standard protocol without IV contrast. Sagittal and coronal MPR images reconstructed from axial data set. Patient drank dilute oral contrast for exam. COMPARISON:  05/20/2014 FINDINGS: Lower chest: Atelectasis and question infiltrate in RIGHT lower lobe. Minimal LEFT basilar atelectasis. Hepatobiliary: Unremarkable liver and gallbladder Pancreas: Normal appearance Spleen: Calcified granuloma.  Otherwise normal appearance. Adrenals/Urinary Tract: Adrenal glands normal appearance. BILATERAL renal cysts largest 7.7 x 7.1 cm inferior pole RIGHT kidney image 40 and 3.5 x 3.4 cm inferior pole LEFT kidney image 45. Bladder and ureters unremarkable. Marked prostatic enlargement, gland 6.6 x 6.2 x 5.8 cm. Stomach/Bowel: Stomach normal appearance. Distended proximal and decompressed distal small bowel loops compatible with small bowel obstruction. Transition zone at small bowel loop in the RIGHT mid abdomen image 48. Colon unremarkable. No definite bowel wall thickening. Upper normal caliber appendix in RIGHT pelvis without inflammatory changes.  Vascular/Lymphatic: Atherosclerotic calcification aorta without aneurysm. No adenopathy Reproductive: N/A Other: Tiny umbilical hernia containing fat. Small amount of free fluid perihepatic. No free air. Musculoskeletal: Unremarkable IMPRESSION: Mid small bowel obstruction with transition zone in RIGHT mid abdomen, favor adhesion. Small amount of perihepatic free fluid. Marked prostatic enlargement. BILATERAL renal cysts. Atelectasis and question mild infiltrate LEFT lower lobe. Electronically Signed   By: Lavonia Dana M.D.   On: 12/07/2015 07:18   Dg Abd 2 Views  Result Date: 12/08/2015 CLINICAL DATA:  Abdominal pain, nausea EXAM: ABDOMEN - 2 VIEW COMPARISON:  12/07/2015 FINDINGS: Persistent gaseous distended small bowel loops mid abdomen with air-fluid levels consistent with small bowel obstruction. Moderate gas and fluid noted within stomach. IMPRESSION: Persistent gaseous distended small bowel loops mid abdomen with air-fluid level consistent with small bowel obstruction. Electronically Signed   By: Lahoma Crocker M.D.   On: 12/08/2015 11:06   Dg Abd 2 Views  Result Date: 12/07/2015 CLINICAL DATA:  Hervey Ard left lower abdominal pain for 2 days. EXAM: ABDOMEN -  2 VIEW COMPARISON:  CT earlier today. FINDINGS: Dilated small bowel loops with air-fluid levels compatible with small bowel obstruction as seen on prior CT. No free air. No organomegaly or suspicious calcification. IMPRESSION: Small bowel obstruction. Electronically Signed   By: Rolm Baptise M.D.   On: 12/07/2015 15:56        Scheduled Meds: . brimonidine  1 drop Both Eyes BID  . cloNIDine  0.1 mg Transdermal Weekly  . diatrizoate meglumine-sodium  90 mL Oral Once  . famotidine (PEPCID) IV  20 mg Intravenous Q12H  . heparin  5,000 Units Subcutaneous Q8H  . latanoprost  1 drop Both Eyes QHS  . polyvinyl alcohol  1 drop Both Eyes 6 X Daily   Continuous Infusions: . sodium chloride 100 mL/hr at 12/08/15 1212     LOS: 1 day    Time  spent: 25  minutes   Elmarie Shiley, MD [facility] Pager [phone]  If 7PM-7AM, please contact night-coverage [email or web address] Password [email or web address] 12/08/2015, 2:22 PM

## 2015-12-08 NOTE — Consult Note (Signed)
            [facility] CM Primary Care Navigator  12/08/2015  John [name] April 09, 1940 [id number]   Patient seen at the bedside identify possible discharge needs.  Patient with NGT in place connected to suction which limits his ability to talk.  He confirmed having abdominal distention with worsening pain that led to this admission.  Patient confirms that primary care provider is Dr. Teressa Lower with [facility].  Patient is able to drive prior to admission. Transportation to doctors' appointments after discharge can be provided by wife [name]) as stated.   Patient uses [facility] ([place]) to obtain medications. He states doing his own medication management at home using "pill box" system. Patient confirmed that wife will be the primary caregiver at home.   Patient had voiced understanding to call primary care provider's office once discharged, for a post discharge follow-up appointment within a week or sooner if needs arise.  Patient letter provided as a reminder.  Patient declined to discuss any further needs for disease management at this time.  For additional questions please contact:  Edwena Felty A. Shontae Rosiles, BSN, RN-BC [facility] PRIMARY CARE Navigator Cell: [phone]

## 2015-12-08 NOTE — Progress Notes (Signed)
58 Fr. NGT placed by surgeon.  550 + cc of foul-smelling brownish aspirate returned.  Placed on low intermittent suction.  Will decompress and possible protocol later today.  KUB stat  Kathryne Eriksson. Dahlia Bailiff, MD, [place] [phone] [facility] Surgery

## 2015-12-08 NOTE — Telephone Encounter (Signed)
SN is aware that pt is currently admitted and will follow him through epic.

## 2015-12-08 NOTE — Progress Notes (Signed)
Subjective: He appears less uncomfortable than yesterday afternoon. Stomach is still distended. He took something for pain last evening and had itching. Few BS, BP still up 183/96, hr 61 O2 sats 99% on room air.  Objective: Vital signs in last 24 hours: Temp:  [97.7 F (36.5 C)-98.2 F (36.8 C)] 97.7 F (36.5 C) (10/26 0240) Pulse Rate:  [50-66] 59 (10/26 0240) Resp:  [19] 19 (10/26 0240) BP: (163-200)/(77-105) 163/77 (10/26 0400) SpO2:  [96 %-99 %] 96 % (10/26 0240) Last BM Date: 12/06/15 NPO, No ng inIV 2875 Urine 600 Last BP 163/77 at 3 AM creatine is better WBC is 7.8, \ Platelets 125K XRAY still pending   Intake/Output from previous day: 10/25 0701 - 10/26 0700 In: 2875 [I.V.:2875] Out: 600 [Urine:600] Intake/Output this shift: No intake/output data recorded.  General appearance: alert, cooperative and Still uncomfortable GI: Still distended, less discomfort, few bowel sounds, no flatus or BM, no nausea or vomiting.  Lab Results:   Recent Labs  12/07/15 0030 12/08/15 0409  WBC 8.3 7.8  HGB 14.4 14.7  HCT 40.8 41.7  PLT 126* 125*    BMET  Recent Labs  12/07/15 0030 12/08/15 0409  NA 139 140  K 3.9 3.7  CL 105 105  CO2 22 27  GLUCOSE 165* 119*  BUN 17 21*  CREATININE 1.64* 1.46*  CALCIUM 9.3 8.6*   PT/INR No results for input(s): LABPROT, INR in the last 72 hours.   Recent Labs Lab 12/07/15 0030  AST 22  ALT 15*  ALKPHOS 72  BILITOT 1.0  PROT 7.3  ALBUMIN 3.9     Lipase     Component Value Date/Time   LIPASE 19 12/07/2015 0030     Studies/Results: Ct Abdomen Pelvis Wo Contrast  Result Date: 12/07/2015 CLINICAL DATA:  Severe pain near her umbilicus, difficulty with ambulation due to pain, nausea EXAM: CT ABDOMEN AND PELVIS WITHOUT CONTRAST TECHNIQUE: Multidetector CT imaging of the abdomen and pelvis was performed following the standard protocol without IV contrast. Sagittal and coronal MPR images reconstructed from axial data  set. Patient drank dilute oral contrast for exam. COMPARISON:  05/20/2014 FINDINGS: Lower chest: Atelectasis and question infiltrate in RIGHT lower lobe. Minimal LEFT basilar atelectasis. Hepatobiliary: Unremarkable liver and gallbladder Pancreas: Normal appearance Spleen: Calcified granuloma.  Otherwise normal appearance. Adrenals/Urinary Tract: Adrenal glands normal appearance. BILATERAL renal cysts largest 7.7 x 7.1 cm inferior pole RIGHT kidney image 40 and 3.5 x 3.4 cm inferior pole LEFT kidney image 45. Bladder and ureters unremarkable. Marked prostatic enlargement, gland 6.6 x 6.2 x 5.8 cm. Stomach/Bowel: Stomach normal appearance. Distended proximal and decompressed distal small bowel loops compatible with small bowel obstruction. Transition zone at small bowel loop in the RIGHT mid abdomen image 48. Colon unremarkable. No definite bowel wall thickening. Upper normal caliber appendix in RIGHT pelvis without inflammatory changes. Vascular/Lymphatic: Atherosclerotic calcification aorta without aneurysm. No adenopathy Reproductive: N/A Other: Tiny umbilical hernia containing fat. Small amount of free fluid perihepatic. No free air. Musculoskeletal: Unremarkable IMPRESSION: Mid small bowel obstruction with transition zone in RIGHT mid abdomen, favor adhesion. Small amount of perihepatic free fluid. Marked prostatic enlargement. BILATERAL renal cysts. Atelectasis and question mild infiltrate LEFT lower lobe. Electronically Signed   By: Lavonia Dana M.D.   On: 12/07/2015 07:18   Dg Abd 2 Views  Result Date: 12/07/2015 CLINICAL DATA:  Hervey Ard left lower abdominal pain for 2 days. EXAM: ABDOMEN - 2 VIEW COMPARISON:  CT earlier today. FINDINGS: Dilated small bowel loops  with air-fluid levels compatible with small bowel obstruction as seen on prior CT. No free air. No organomegaly or suspicious calcification. IMPRESSION: Small bowel obstruction. Electronically Signed   By: Rolm Baptise M.D.   On: 12/07/2015 15:56     Medications: . brimonidine  1 drop Both Eyes BID  . heparin  5,000 Units Subcutaneous Q8H  . latanoprost  1 drop Both Eyes QHS  . polyvinyl alcohol  1 drop Both Eyes 6 X Daily   . sodium chloride 125 mL/hr at 12/08/15 0035    Assessment/Plan [name], with no prior abdominal surgical history. No prior history of [name]. History of prostate cancer with holistic treatment. Status post TURP Hypertension  - Poor control Bradycardia  Glaucoma History of sleep apnea - no CPAP Lateral renal cyst FEN:  NPO/IV fluids ID:  No abx DVT:  Heparin/SCD   Plan: I'm going to talk to medicine about his blood pressure, and asked him to review EKG. Continue nothing by mouth, IV fluids, repeat films now.  Going to try oral contrast and SB protocol.  LOS: 1 day    Dazia Lippold 12/08/2015 [phone]

## 2015-12-09 ENCOUNTER — Inpatient Hospital Stay (HOSPITAL_COMMUNITY): Payer: Medicare Other

## 2015-12-09 DIAGNOSIS — R9431 Abnormal electrocardiogram [ECG] [EKG]: Secondary | ICD-10-CM

## 2015-12-09 LAB — BASIC METABOLIC PANEL
ANION GAP: 11 (ref 5–15)
BUN: 22 mg/dL — ABNORMAL HIGH (ref 6–20)
CALCIUM: 8.9 mg/dL (ref 8.9–10.3)
CHLORIDE: 104 mmol/L (ref 101–111)
CO2: 27 mmol/L (ref 22–32)
Creatinine, Ser: 1.59 mg/dL — ABNORMAL HIGH (ref 0.61–1.24)
GFR calc non Af Amer: 42 mL/min — ABNORMAL LOW (ref >60)
GFR, EST AFRICAN AMERICAN: 48 mL/min — AB (ref >60)
GLUCOSE: 128 mg/dL — AB (ref 65–99)
POTASSIUM: 3.7 mmol/L (ref 3.5–5.1)
Sodium: 142 mmol/L (ref 135–145)

## 2015-12-09 LAB — CBC
HEMATOCRIT: 45.5 % (ref 39.0–52.0)
HEMOGLOBIN: 15.9 g/dL (ref 13.0–17.0)
MCH: 26.7 pg (ref 26.0–34.0)
MCHC: 34.9 g/dL (ref 30.0–36.0)
MCV: 76.5 fL — AB (ref 78.0–100.0)
Platelets: 133 10*3/uL — ABNORMAL LOW (ref 150–400)
RBC: 5.95 MIL/uL — AB (ref 4.22–5.81)
RDW: 14.7 % (ref 11.5–15.5)
WBC: 5.1 10*3/uL (ref 4.0–10.5)

## 2015-12-09 LAB — ECHOCARDIOGRAM COMPLETE
Height: 75 in
WEIGHTICAEL: 3520 [oz_av]

## 2015-12-09 LAB — GLUCOSE, CAPILLARY: Glucose-Capillary: 125 mg/dL — ABNORMAL HIGH (ref 65–99)

## 2015-12-09 MED ORDER — METOPROLOL TARTRATE 5 MG/5ML IV SOLN: 2.5000 mg | Freq: Three times a day (TID) | INTRAVENOUS | Status: DC | PRN

## 2015-12-09 MED ORDER — HYDRALAZINE HCL 20 MG/ML IJ SOLN
10.0000 mg | INTRAMUSCULAR | Status: DC | PRN
  Administered 2015-12-09 – 2015-12-12 (×4): 10 mg via INTRAVENOUS
  Filled 2015-12-09 (×5): qty 1

## 2015-12-09 NOTE — Progress Notes (Signed)
  Echocardiogram 2D Echocardiogram has been performed.  Tresa Res 12/09/2015, 11:55 AM

## 2015-12-09 NOTE — Progress Notes (Signed)
Subjective: He does not look better, his abdomen still distended and tender. He has bowel sounds. He remains hypertensive. He says the morphine works but has not had any since 3 AM. I told her to ask for some more.  Objective: Vital signs in last 24 hours: Temp:  [98.1 F (36.7 C)-98.7 F (37.1 C)] 98.7 F (37.1 C) (10/27 0620) Pulse Rate:  [67-98] 67 (10/27 0620) Resp:  [19-20] 20 (10/27 0620) BP: (155-177)/(73-103) 159/99 (10/27 0700) SpO2:  [93 %-99 %] 93 % (10/27 0620) Last BM Date: 12/06/15 NG 1600 Urine 625 IV 2555 Afebrile, BP still up Creatinine is up but stable SBP Film 2 AM: Esophageal tube tip overlies the distal stomach. Persistent mild gas-filled loops of dilated central small bowel, not significantly changed compared with most recent prior but slightly improved since the CT. No significant contrast is present within the bowel. Intake/Output from previous day: 10/26 0701 - 10/27 0700 In: 2655 [I.V.:2555; IV Piggyback:100] Out: 2225 [Urine:625; Emesis/NG output:1600] Intake/Output this shift: No intake/output data recorded.  General appearance: alert, cooperative and no distress Resp: clear to auscultation bilaterally GI: Abdomen remains distended still tender and taking pain medications. It's a generalized abdominal discomfort. He has bowel sounds NG is working.  Lab Results:   Recent Labs  12/08/15 0409 12/09/15 0601  WBC 7.8 5.1  HGB 14.7 15.9  HCT 41.7 45.5  PLT 125* 133*    BMET  Recent Labs  12/08/15 0409 12/09/15 0601  NA 140 142  K 3.7 3.7  CL 105 104  CO2 27 27  GLUCOSE 119* 128*  BUN 21* 22*  CREATININE 1.46* 1.59*  CALCIUM 8.6* 8.9   PT/INR No results for input(s): LABPROT, INR in the last 72 hours.   Recent Labs Lab 12/07/15 0030  AST 22  ALT 15*  ALKPHOS 72  BILITOT 1.0  PROT 7.3  ALBUMIN 3.9     Lipase     Component Value Date/Time   LIPASE 19 12/07/2015 0030     Studies/Results: Dg Abd 2 Views  Result  Date: 12/08/2015 CLINICAL DATA:  Abdominal pain, nausea EXAM: ABDOMEN - 2 VIEW COMPARISON:  12/07/2015 FINDINGS: Persistent gaseous distended small bowel loops mid abdomen with air-fluid levels consistent with small bowel obstruction. Moderate gas and fluid noted within stomach. IMPRESSION: Persistent gaseous distended small bowel loops mid abdomen with air-fluid level consistent with small bowel obstruction. Electronically Signed   By: Lahoma Crocker M.D.   On: 12/08/2015 11:06   Dg Abd 2 Views  Result Date: 12/07/2015 CLINICAL DATA:  Hervey Ard left lower abdominal pain for 2 days. EXAM: ABDOMEN - 2 VIEW COMPARISON:  CT earlier today. FINDINGS: Dilated small bowel loops with air-fluid levels compatible with small bowel obstruction as seen on prior CT. No free air. No organomegaly or suspicious calcification. IMPRESSION: Small bowel obstruction. Electronically Signed   By: Rolm Baptise M.D.   On: 12/07/2015 15:56   Dg Abd Portable 1v-small Bowel Obstruction Protocol-initial, 8 Hr Delay  Result Date: 12/09/2015 CLINICAL DATA:  [name] protocol 8 hour delay after Gastrografin EXAM: PORTABLE ABDOMEN - 1 VIEW COMPARISON:  CT 12/07/2015, radiograph 12/08/2015, FINDINGS: Esophageal tube tip overlies the distal stomach. Persistent mild gas-filled loops of dilated central small bowel, not significantly changed compared with most recent prior but slightly improved since the CT. No significant contrast is present within the bowel. IMPRESSION: Mild gaseous dilatation of central small bowel loops suggests partial obstruction. Overall gas pattern has slightly improved since the CT scan. Electronically Signed  By: Donavan Foil M.D.   On: 12/09/2015 02:31   Dg Abd Portable 1v-small Bowel Protocol-position Verification  Result Date: 12/08/2015 CLINICAL DATA:  NG placement EXAM: PORTABLE ABDOMEN - 1 VIEW COMPARISON:  12/08/2015 FINDINGS: NG tube passes through the stomach with the tip near the pylorus. Interval decompression  of the stomach. Dilated small bowel loops somewhat improved from earlier today. IMPRESSION: Satisfactory NG tube placement with decompression of the stomach. Improvement in small bowel dilatation. Electronically Signed   By: Franchot Gallo M.D.   On: 12/08/2015 15:38    Medications: . brimonidine  1 drop Both Eyes BID  . cloNIDine  0.1 mg Transdermal Weekly  . famotidine (PEPCID) IV  20 mg Intravenous Q12H  . heparin  5,000 Units Subcutaneous Q8H  . latanoprost  1 drop Both Eyes QHS  . polyvinyl alcohol  1 drop Both Eyes 6 X Daily   . sodium chloride 100 mL/hr  (12/09/15 0327)   Assessment/Plan [name], with no prior abdominal surgical history. No prior history of [name]. History of prostate cancer with holistic treatment. Status post TURP Hypertension  - Poor control Chronic kidney disease Bradycardia  Glaucoma History of sleep apnea - no CPAP Lateral renal cyst FEN:  NPO/IV fluids ID:  No abx DVT:  Heparin/SCD  No improvement with NG functioning well. Continue NG suction I will discuss with medicine. He may end up going to the OR if he doesn't improve soon. He just has 24 hours on NG suction so far. Medicine following will review EKG and continue with blood pressure and chronic kidney disease management.    LOS: 2 days    Akram Kissick 12/09/2015 [phone]

## 2015-12-09 NOTE — Progress Notes (Signed)
PROGRESS NOTE    John [name]  [id number] DOB: 1940-08-06 DOA: 12/07/2015 PCP: Noralee Space, MD   Brief Narrative: John [name] is an 75 y.o. male who was admitted to the surgical service today for a diagnosis of small bowel obstruction, patient has been placed on nothing by mouth, hydromorphone IV for pain control. Over the course of the day his blood pressure has become uncontrolled with systolic between Q000111Q and [id number]. Reports having abdominal pain, 8 out of 10 in intensity, dull in nature, no radiation, improved with pain medication, no worsening factors, no associated nausea or vomiting. Patient took his antihypertensive agents yesterday, losartan. His blood pressure has been uncontrolled as an outpatient.    Assessment & Plan:   Active Problems:   Essential hypertension   Sickle cell trait (HCC)   [name] (small bowel obstruction)  1-HTN, uncontrolled. Unable to use oral home regimen, patient is not tolerating oral medications.  Continue with PRN hydralazine.  Start clonidine patch.  IV metoprolol/   2-[name]; per surgery. NG tube to be place.  SBP protocol.  IV Pepcid, GI prophylaxis.  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 He denies history of chest pain on exertion. Will order ECHO. He denies chest pain.  Echo with normal Ef 65 %. No wall motion abnormalities. Patient is at moderate risk for procedure due to age, HTN, CKD.   3-hiccup; thorazine PRN   4-Chronic kidney disease stage 3;  Continue with IV fluids.  Cr last year 1.4--1.6  5-Thrombocytopenia; follow trend.   Thank you for this consultation.  Our [facility] hospitalist team will follow the patient with you.  DVT prophylaxis: heparin  Code Status: full code.  Family Communication: care discussed with patient.  Disposition Plan: remain inpatient     Procedures:   none   Antimicrobials:  none  Subjective: Still with significant abdominal distension,  discomfort.  He is sleepy , received pain medication.    Objective: Vitals:   12/09/15 0344 12/09/15 0620 12/09/15 0700 12/09/15 0959  BP: (!) 155/73 (!) 163/98 (!) 159/99 (!) 158/80  Pulse:  67  (!) 111  Resp:  20  (!) 22  Temp:  98.7 F (37.1 C)  99.5 F (37.5 C)  TempSrc:    Axillary  SpO2:  93%  97%  Weight:      Height:        Intake/Output Summary (Last 24 hours) at 12/09/15 1021 Last data filed at 12/09/15 0600  Gross per 24 hour  Intake             2655 ml  Output             2000 ml  Net              655 ml   Filed Weights   12/07/15 0021  Weight: 99.8 kg (220 lb)    Examination:  General exam: Appears calm and comfortable  Respiratory system: Clear to auscultation. Respiratory effort normal. Cardiovascular system: S1 & S2 heard, RRR. No JVD, murmurs, rubs, gallops or clicks. No pedal edema. Gastrointestinal system: Abdomen is distended, soft and mild tender. No organomegaly or masses felt. Normal bowel sounds heard. Central nervous system: Alert and oriented. No focal neurological deficits. Extremities: Symmetric 5 x 5 power. Skin: No rashes, lesions or ulcers Psychiatry: Judgement and insight appear normal. Mood & affect appropriate.     Data Reviewed: I have personally reviewed following labs and imaging studies  CBC:  Recent Labs Lab 12/07/15 0030 12/08/15 0409 12/09/15 0601  WBC 8.3 7.8 5.1  HGB 14.4 14.7 15.9  HCT 40.8 41.7 45.5  MCV 75.4* 76.2* 76.5*  PLT 126* 125* Q000111Q*   Basic Metabolic Panel:  Recent Labs Lab 12/07/15 0030 12/08/15 0409 12/09/15 0601  NA 139 140 142  K 3.9 3.7 3.7  CL 105 105 104  CO2 22 27 27   GLUCOSE 165* 119* 128*  BUN 17 21* 22*  CREATININE 1.64* 1.46* 1.59*  CALCIUM 9.3 8.6* 8.9   GFR: Estimated Creatinine Clearance: 50.2 mL/min (by C-G formula based on SCr of 1.59 mg/dL (H)). Liver Function Tests:  Recent Labs Lab 12/07/15 0030  AST 22  ALT 15*  ALKPHOS 72  BILITOT 1.0  PROT 7.3  ALBUMIN  3.9    Recent Labs Lab 12/07/15 0030  LIPASE 19   No results for input(s): AMMONIA in the last 168 hours. Coagulation Profile: No results for input(s): INR, PROTIME in the last 168 hours. Cardiac Enzymes: No results for input(s): CKTOTAL, CKMB, CKMBINDEX, TROPONINI in the last 168 hours. BNP (last 3 results) No results for input(s): PROBNP in the last 8760 hours. HbA1C: No results for input(s): HGBA1C in the last 72 hours. CBG: No results for input(s): GLUCAP in the last 168 hours. Lipid Profile: No results for input(s): CHOL, HDL, LDLCALC, TRIG, CHOLHDL, LDLDIRECT in the last 72 hours. Thyroid Function Tests: No results for input(s): TSH, T4TOTAL, FREET4, T3FREE, THYROIDAB in the last 72 hours. Anemia Panel: No results for input(s): VITAMINB12, FOLATE, FERRITIN, TIBC, IRON, RETICCTPCT in the last 72 hours. Sepsis Labs: No results for input(s): PROCALCITON, LATICACIDVEN in the last 168 hours.  No results found for this or any previous visit (from the past 240 hour(s)).       Radiology Studies: Dg Abd 2 Views  Result Date: 12/08/2015 CLINICAL DATA:  Abdominal pain, nausea EXAM: ABDOMEN - 2 VIEW COMPARISON:  12/07/2015 FINDINGS: Persistent gaseous distended small bowel loops mid abdomen with air-fluid levels consistent with small bowel obstruction. Moderate gas and fluid noted within stomach. IMPRESSION: Persistent gaseous distended small bowel loops mid abdomen with air-fluid level consistent with small bowel obstruction. Electronically Signed   By: Lahoma Crocker M.D.   On: 12/08/2015 11:06   Dg Abd 2 Views  Result Date: 12/07/2015 CLINICAL DATA:  Hervey Ard left lower abdominal pain for 2 days. EXAM: ABDOMEN - 2 VIEW COMPARISON:  CT earlier today. FINDINGS: Dilated small bowel loops with air-fluid levels compatible with small bowel obstruction as seen on prior CT. No free air. No organomegaly or suspicious calcification. IMPRESSION: Small bowel obstruction. Electronically Signed    By: Rolm Baptise M.D.   On: 12/07/2015 15:56   Dg Abd Portable 1v-small Bowel Obstruction Protocol-initial, 8 Hr Delay  Result Date: 12/09/2015 CLINICAL DATA:  [name] protocol 8 hour delay after Gastrografin EXAM: PORTABLE ABDOMEN - 1 VIEW COMPARISON:  CT 12/07/2015, radiograph 12/08/2015, FINDINGS: Esophageal tube tip overlies the distal stomach. Persistent mild gas-filled loops of dilated central small bowel, not significantly changed compared with most recent prior but slightly improved since the CT. No significant contrast is present within the bowel. IMPRESSION: Mild gaseous dilatation of central small bowel loops suggests partial obstruction. Overall gas pattern has slightly improved since the CT scan. Electronically Signed   By: Donavan Foil M.D.   On: 12/09/2015 02:31   Dg Abd Portable 1v-small Bowel Protocol-position Verification  Result Date: 12/08/2015 CLINICAL DATA:  NG placement EXAM: PORTABLE ABDOMEN - 1  VIEW COMPARISON:  12/08/2015 FINDINGS: NG tube passes through the stomach with the tip near the pylorus. Interval decompression of the stomach. Dilated small bowel loops somewhat improved from earlier today. IMPRESSION: Satisfactory NG tube placement with decompression of the stomach. Improvement in small bowel dilatation. Electronically Signed   By: Franchot Gallo M.D.   On: 12/08/2015 15:38        Scheduled Meds: . brimonidine  1 drop Both Eyes BID  . cloNIDine  0.1 mg Transdermal Weekly  . famotidine (PEPCID) IV  20 mg Intravenous Q12H  . heparin  5,000 Units Subcutaneous Q8H  . latanoprost  1 drop Both Eyes QHS  . polyvinyl alcohol  1 drop Both Eyes 6 X Daily   Continuous Infusions: . sodium chloride 1 mL (12/09/15 0327)     LOS: 2 days    Time spent: 25  minutes   Elmarie Shiley, MD [facility] Pager [phone]  If 7PM-7AM, please contact night-coverage [email or web address] Password [email or web address] 12/09/2015, 10:21 AM

## 2015-12-10 ENCOUNTER — Inpatient Hospital Stay (HOSPITAL_COMMUNITY): Payer: Medicare Other | Admitting: Anesthesiology

## 2015-12-10 ENCOUNTER — Encounter (HOSPITAL_COMMUNITY): Admission: EM | Disposition: A | Discharge status: Home / Self Care | Payer: Self-pay | Source: Home / Self Care

## 2015-12-10 ENCOUNTER — Inpatient Hospital Stay (HOSPITAL_COMMUNITY): Payer: Medicare Other

## 2015-12-10 HISTORY — PX: LAPAROTOMY: SHX154

## 2015-12-10 LAB — BASIC METABOLIC PANEL
ANION GAP: 8 (ref 5–15)
BUN: 25 mg/dL — ABNORMAL HIGH (ref 6–20)
CALCIUM: 8.8 mg/dL — AB (ref 8.9–10.3)
CO2: 25 mmol/L (ref 22–32)
Chloride: 110 mmol/L (ref 101–111)
Creatinine, Ser: 1.7 mg/dL — ABNORMAL HIGH (ref 0.61–1.24)
GFR, EST AFRICAN AMERICAN: 45 mL/min — AB (ref >60)
GFR, EST NON AFRICAN AMERICAN: 38 mL/min — AB (ref >60)
Glucose, Bld: 102 mg/dL — ABNORMAL HIGH (ref 65–99)
POTASSIUM: 3.3 mmol/L — AB (ref 3.5–5.1)
Sodium: 143 mmol/L (ref 135–145)

## 2015-12-10 LAB — MRSA PCR SCREENING: MRSA by PCR: NEGATIVE

## 2015-12-10 LAB — CBC
HEMATOCRIT: 39.6 % (ref 39.0–52.0)
Hemoglobin: 13.6 g/dL (ref 13.0–17.0)
MCH: 26.2 pg (ref 26.0–34.0)
MCHC: 34.3 g/dL (ref 30.0–36.0)
MCV: 76.2 fL — AB (ref 78.0–100.0)
PLATELETS: 121 10*3/uL — AB (ref 150–400)
RBC: 5.2 MIL/uL (ref 4.22–5.81)
RDW: 14.6 % (ref 11.5–15.5)
WBC: 6.3 10*3/uL (ref 4.0–10.5)

## 2015-12-10 SURGERY — LAPAROTOMY, EXPLORATORY
Anesthesia: General | Site: Abdomen

## 2015-12-10 MED ORDER — ACETAMINOPHEN 160 MG/5ML PO SOLN: 325.0000 mg | ORAL | Status: DC | PRN

## 2015-12-10 MED ORDER — LIDOCAINE HCL 1 % IJ SOLN
INTRAMUSCULAR | Status: DC | PRN
  Administered 2015-12-10: 20 mL

## 2015-12-10 MED ORDER — FENTANYL CITRATE (PF) 100 MCG/2ML IJ SOLN
INTRAMUSCULAR | Status: DC | PRN
  Administered 2015-12-10 (×6): 50 ug via INTRAVENOUS

## 2015-12-10 MED ORDER — LIDOCAINE HCL (CARDIAC) 20 MG/ML IV SOLN
INTRAVENOUS | Status: DC | PRN
  Administered 2015-12-10: 70 mg via INTRAVENOUS

## 2015-12-10 MED ORDER — LACTATED RINGERS IV SOLN
INTRAVENOUS | Status: DC | PRN
  Administered 2015-12-10: 10:00:00 via INTRAVENOUS

## 2015-12-10 MED ORDER — ONDANSETRON 4 MG PO TBDP: 4.0000 mg | ORAL_TABLET | Freq: Four times a day (QID) | ORAL | Status: DC | PRN

## 2015-12-10 MED ORDER — SUGAMMADEX SODIUM 500 MG/5ML IV SOLN
INTRAVENOUS | Status: AC
  Filled 2015-12-10: qty 5

## 2015-12-10 MED ORDER — FENTANYL CITRATE (PF) 100 MCG/2ML IJ SOLN
INTRAMUSCULAR | Status: AC
  Filled 2015-12-10: qty 2

## 2015-12-10 MED ORDER — LACTATED RINGERS IV SOLN: INTRAVENOUS | Status: DC

## 2015-12-10 MED ORDER — ACETAMINOPHEN 325 MG PO TABS: 325.0000 mg | ORAL_TABLET | ORAL | Status: DC | PRN

## 2015-12-10 MED ORDER — HYDROMORPHONE HCL 1 MG/ML IJ SOLN
0.2500 mg | INTRAMUSCULAR | Status: DC | PRN
  Administered 2015-12-10 (×4): 0.5 mg via INTRAVENOUS

## 2015-12-10 MED ORDER — BUPIVACAINE-EPINEPHRINE (PF) 0.5% -1:200000 IJ SOLN
INTRAMUSCULAR | Status: AC
  Filled 2015-12-10: qty 30

## 2015-12-10 MED ORDER — PROPOFOL 10 MG/ML IV BOLUS
INTRAVENOUS | Status: DC | PRN
  Administered 2015-12-10: 40 mg via INTRAVENOUS
  Administered 2015-12-10: 100 mg via INTRAVENOUS
  Administered 2015-12-10: 20 mg via INTRAVENOUS

## 2015-12-10 MED ORDER — LOSARTAN POTASSIUM 50 MG PO TABS
100.0000 mg | ORAL_TABLET | Freq: Every day | ORAL | Status: DC
  Filled 2015-12-10: qty 2

## 2015-12-10 MED ORDER — 0.9 % SODIUM CHLORIDE (POUR BTL) OPTIME
TOPICAL | Status: DC | PRN
  Administered 2015-12-10: 1000 mL

## 2015-12-10 MED ORDER — METHOCARBAMOL 500 MG PO TABS
500.0000 mg | ORAL_TABLET | Freq: Four times a day (QID) | ORAL | Status: DC | PRN
  Filled 2015-12-10: qty 1

## 2015-12-10 MED ORDER — HYDROMORPHONE HCL 2 MG/ML IJ SOLN
INTRAMUSCULAR | Status: AC
  Filled 2015-12-10: qty 1

## 2015-12-10 MED ORDER — ONDANSETRON HCL 4 MG/2ML IJ SOLN
INTRAMUSCULAR | Status: AC
  Filled 2015-12-10: qty 2

## 2015-12-10 MED ORDER — SUGAMMADEX SODIUM 500 MG/5ML IV SOLN
INTRAVENOUS | Status: DC | PRN
  Administered 2015-12-10: 400 mg via INTRAVENOUS

## 2015-12-10 MED ORDER — PROPOFOL 10 MG/ML IV BOLUS
INTRAVENOUS | Status: AC
  Filled 2015-12-10: qty 20

## 2015-12-10 MED ORDER — ONDANSETRON HCL 4 MG/2ML IJ SOLN: 4.0000 mg | Freq: Four times a day (QID) | INTRAMUSCULAR | Status: DC | PRN

## 2015-12-10 MED ORDER — OXYCODONE HCL 5 MG/5ML PO SOLN: 5.0000 mg | Freq: Once | ORAL | Status: DC | PRN

## 2015-12-10 MED ORDER — FUROSEMIDE 20 MG PO TABS: 20.0000 mg | ORAL_TABLET | Freq: Every day | ORAL | Status: DC | PRN

## 2015-12-10 MED ORDER — ROCURONIUM BROMIDE 100 MG/10ML IV SOLN
INTRAVENOUS | Status: DC | PRN
  Administered 2015-12-10: 50 mg via INTRAVENOUS

## 2015-12-10 MED ORDER — SUCCINYLCHOLINE CHLORIDE 20 MG/ML IJ SOLN
INTRAMUSCULAR | Status: DC | PRN
  Administered 2015-12-10: 70 mg via INTRAVENOUS

## 2015-12-10 MED ORDER — PIPERACILLIN-TAZOBACTAM 3.375 G IVPB
3.3750 g | Freq: Three times a day (TID) | INTRAVENOUS | Status: DC
Start: 2015-12-10 — End: 2015-12-11
  Administered 2015-12-10 – 2015-12-11 (×4): 3.375 g via INTRAVENOUS
  Filled 2015-12-10 (×8): qty 50

## 2015-12-10 MED ORDER — OXYCODONE HCL 5 MG PO TABS: 5.0000 mg | ORAL_TABLET | Freq: Once | ORAL | Status: DC | PRN

## 2015-12-10 MED ORDER — FENTANYL CITRATE (PF) 100 MCG/2ML IJ SOLN
INTRAMUSCULAR | Status: AC
Start: 2015-12-10 — End: 2015-12-10
  Filled 2015-12-10: qty 2

## 2015-12-10 MED ORDER — ONDANSETRON HCL 4 MG/2ML IJ SOLN
INTRAMUSCULAR | Status: DC | PRN
Start: 2015-12-10 — End: 2015-12-10
  Administered 2015-12-10: 4 mg via INTRAVENOUS

## 2015-12-10 MED ORDER — PHENYLEPHRINE HCL 10 MG/ML IJ SOLN
INTRAMUSCULAR | Status: DC | PRN
  Administered 2015-12-10: 160 ug via INTRAVENOUS
  Administered 2015-12-10: 120 ug via INTRAVENOUS

## 2015-12-10 MED ORDER — LOSARTAN POTASSIUM 50 MG PO TABS: 100.0000 mg | ORAL_TABLET | Freq: Every day | ORAL | Status: DC

## 2015-12-10 SURGICAL SUPPLY — 37 items
BLADE SURG ROTATE 9660 (MISCELLANEOUS) ×2 IMPLANT
CANISTER SUCTION 2500CC (MISCELLANEOUS) ×3 IMPLANT
CHLORAPREP W/TINT 26ML (MISCELLANEOUS) ×3 IMPLANT
COVER SURGICAL LIGHT HANDLE (MISCELLANEOUS) ×3 IMPLANT
DRAPE LAPAROSCOPIC ABDOMINAL (DRAPES) ×3 IMPLANT
DRAPE WARM FLUID 44X44 (DRAPE) ×3 IMPLANT
DRSG COVADERM 4X10 (GAUZE/BANDAGES/DRESSINGS) ×2 IMPLANT
DRSG OPSITE POSTOP 4X10 (GAUZE/BANDAGES/DRESSINGS) IMPLANT
DRSG OPSITE POSTOP 4X8 (GAUZE/BANDAGES/DRESSINGS) IMPLANT
ELECT BLADE 6.5 EXT (BLADE) ×2 IMPLANT
ELECT CAUTERY BLADE 6.4 (BLADE) ×4 IMPLANT
ELECT REM PT RETURN 9FT ADLT (ELECTROSURGICAL) ×3
ELECTRODE REM PT RTRN 9FT ADLT (ELECTROSURGICAL) ×1 IMPLANT
GLOVE BIO SURGEON STRL SZ7 (GLOVE) ×3 IMPLANT
GLOVE BIOGEL PI IND STRL 7.5 (GLOVE) ×1 IMPLANT
GLOVE BIOGEL PI INDICATOR 7.5 (GLOVE) ×2
GOWN STRL REUS W/ TWL LRG LVL3 (GOWN DISPOSABLE) ×2 IMPLANT
GOWN STRL REUS W/TWL LRG LVL3 (GOWN DISPOSABLE) ×6
KIT BASIN OR (CUSTOM PROCEDURE TRAY) ×3 IMPLANT
KIT ROOM TURNOVER OR (KITS) ×3 IMPLANT
NS IRRIG 1000ML POUR BTL (IV SOLUTION) ×6 IMPLANT
PACK GENERAL/GYN (CUSTOM PROCEDURE TRAY) ×3 IMPLANT
PAD ARMBOARD 7.5X6 YLW CONV (MISCELLANEOUS) ×3 IMPLANT
SEALER TISSUE X1 CVD JAW (INSTRUMENTS) IMPLANT
SPECIMEN JAR LARGE (MISCELLANEOUS) IMPLANT
SPONGE LAP 18X18 X RAY DECT (DISPOSABLE) IMPLANT
STAPLER VISISTAT 35W (STAPLE) ×3 IMPLANT
SUCTION POOLE TIP (SUCTIONS) ×3 IMPLANT
SUT PDS AB 1 TP1 96 (SUTURE) ×6 IMPLANT
SUT SILK 2 0 SH CR/8 (SUTURE) ×3 IMPLANT
SUT SILK 2 0 TIES 10X30 (SUTURE) ×3 IMPLANT
SUT SILK 3 0 SH CR/8 (SUTURE) ×3 IMPLANT
SUT SILK 3 0 TIES 10X30 (SUTURE) ×3 IMPLANT
SUT VIC AB 3-0 SH 18 (SUTURE) IMPLANT
TOWEL OR 17X26 10 PK STRL BLUE (TOWEL DISPOSABLE) ×3 IMPLANT
TRAY FOLEY CATH 16FRSI W/METER (SET/KITS/TRAYS/PACK) ×2 IMPLANT
YANKAUER SUCT BULB TIP NO VENT (SUCTIONS) IMPLANT

## 2015-12-10 NOTE — Op Note (Signed)
Preop diagnosis: Small bowel obstruction Postop diagnosis: Same Procedure performed: [name] Surgeon:Jashira Cotugno K. Anesthesia: Gen. endotracheal Indications:  This is a 75 year old male with no previous abdominal surgery who presents with signs and symptoms of small bowel obstruction. A nasogastric tube was placed in the patient was managed conservatively for a few days. There is no progression after several days so we recommended exploratory laparotomy. On the CT scan, it appears that he has a transition zone in his right lower quadrant.  Description of procedure: The patient is brought to the operating room and placed in supine position on the operating room table. After an adequate level of general anesthesia was obtained, a Foley catheter was placed under sterile technique. There is some difficulty in passing the Foley as the patient has had a previous prostate procedure. We were able to cannulate his bladder and some bloody appearing urine was noted. This cleared up over the course of the surgery. The patient's abdomen was shaved, prepped with ChloraPrep, and draped in sterile fashion. A timeout was taken to ensure the proper patient and proper procedure. We made a vertical incision from the umbilicus down towards the symphysis pubis. We dissected down the linea alba which was opened with cautery. We entered the peritoneal cavity bluntly. There is some free fluid within the abdomen but no purulence was noted. The omentum seems to be adherent down in the right lower quadrant. We were bluntly able to dissect the omentum free and bringing up field. This seemed to release the bowel obstruction. We eviscerated the small bowel and identified a clear transition point. This was patent. There is no chronic stricture. We drilled to milk bowel contents across this transition zone. This seemed to be located in the mid ileum. We identified the ligament of Treitz. The small  bowel proximal is fairly dilated but there is no signs for ischemia. We ran the bowel distally past the transition point. The small bowel distally is decompressed. We ran the bowel all the way down to the cecum. The colon is palpated there is some firm stool within the colon. The nasogastric tube was palpated within the stomach. Liver and gallbladder appear to be normal. We then placed all the small bowel back in the abdomen. The omentum was placed anterior to the small bowel. The fascia was reapproximated with double-stranded #1 PDS suture. The subcutaneous tissues were irrigated. We injected the subcutaneous tissues and fascia with 0.5% Marcaine with epinephrine. Staples were used to close the skin. A dry dressing was applied. The Foley catheter was left in place overnight. The patient was then extubated and brought to recovery room stable condition. All sponge, instrument, and needle counts are correct.  Imogene Burn. Georgette Dover, MD, [facility]  General/ Trauma Surgery  12/10/2015 10:44 AM

## 2015-12-10 NOTE — Transfer of Care (Signed)
Immediate Anesthesia Transfer of Care Note  Patient: John [name]  Procedure(s) Performed: Procedure(s): EXPLORATORY LAPAROTOMY FOR [name] (N/A)  Patient Location: PACU  Anesthesia Type:General  Level of Consciousness: awake, alert , oriented and patient cooperative  Airway & Oxygen Therapy: Patient Spontanous Breathing and Patient connected to nasal cannula oxygen  Post-op Assessment: Report given to RN and Post -op Vital signs reviewed and stable  Post vital signs: Reviewed and stable  Last Vitals:  Vitals:   12/10/15 0300 12/10/15 0523  BP: (!) 157/93 (!) 150/90  Pulse: (!) 103 (!) 104  Resp: 16 17  Temp: 36.8 C 36.7 C    Last Pain:  Vitals:   12/10/15 0814  TempSrc:   PainSc: 2       Patients Stated Pain Goal: 3 ([id number] Q000111Q)  Complications: No apparent anesthesia complications

## 2015-12-10 NOTE — Progress Notes (Signed)
Pharmacy Antibiotic Note  John [name] is a 76 y.o. male admitted on 12/07/2015 with possible intra-abdominal infection in setting of [name].  Pharmacy has been consulted for Zosyn dosing.  SCr 1.59- stable for admission. WBC is within normal limits. Tm 99.6.   Plan: Zosyn 3.375g IV q8h (4 hour infusion).  Monitor renal function, clinical status, and any culture results.   Height: 6\' 3"  (190.5 cm) Weight: 220 lb (99.8 kg) IBW/kg (Calculated) : 84.5  Temp (24hrs), Avg:99.3 F (37.4 C), Min:98.1 F (36.7 C), Max:101.2 F (38.4 C)   Recent Labs Lab 12/07/15 0030 12/08/15 0409 12/09/15 0601  WBC 8.3 7.8 5.1  CREATININE 1.64* 1.46* 1.59*    Estimated Creatinine Clearance: 50.2 mL/min (by C-G formula based on SCr of 1.59 mg/dL (H)).    Allergies  Allergen Reactions  . Amlodipine Besylate Other (See Comments)    REACTION: causes nightmares  . Tamsulosin Other (See Comments)     dizziness    Antimicrobials this admission: 10/28 Zosyn  Dose adjustments this admission:   Microbiology results: none  Thank you for allowing pharmacy to be a part of this patient's care.  Sloan Leiter, PharmD, BCPS Clinical Pharmacist [phone] 12/10/2015 7:46 AM

## 2015-12-10 NOTE — Anesthesia Preprocedure Evaluation (Signed)
Anesthesia Evaluation  Patient identified by MRN, date of birth, ID band Patient awake    Reviewed: Allergy & Precautions, NPO status , Patient's Chart, lab work & pertinent test results  History of Anesthesia Complications Negative for: history of anesthetic complications  Airway Mallampati: II  TM Distance: >3 FB Neck ROM: Full    Dental   Pulmonary neg shortness of breath, sleep apnea , neg COPD, neg recent URI, former smoker,    breath sounds clear to auscultation       Cardiovascular hypertension, Pt. on medications + Peripheral Vascular Disease   Rhythm:Regular     Neuro/Psych PSYCHIATRIC DISORDERS Anxiety negative neurological ROS     GI/Hepatic Neg liver ROS, Small bowel obstruction   Endo/Other  negative endocrine ROS  Renal/GU Renal InsufficiencyRenal disease     Musculoskeletal   Abdominal   Peds  Hematology negative hematology ROS (+)   Anesthesia Other Findings   Reproductive/Obstetrics                             Anesthesia Physical Anesthesia Plan  ASA: III and emergent  Anesthesia Plan: General   Post-op Pain Management:    Induction: Intravenous  Airway Management Planned: Oral ETT  Additional Equipment: None  Intra-op Plan:   Post-operative Plan: Extubation in OR and Possible Post-op intubation/ventilation  Informed Consent: I have reviewed the patients History and Physical, chart, labs and discussed the procedure including the risks, benefits and alternatives for the proposed anesthesia with the patient or authorized representative who has indicated his/her understanding and acceptance.   Dental advisory given  Plan Discussed with: CRNA and Surgeon  Anesthesia Plan Comments:         Anesthesia Quick Evaluation

## 2015-12-10 NOTE — Progress Notes (Signed)
Orthopedic Tech Progress Note Patient Details:  John [name] 1941/02/08 [id number]  Ortho Devices Type of Ortho Device: Abdominal binder Ortho Device/Splint Location: abdomen Ortho Device/Splint Interventions: Loanne Drilling, Lameshia Hypolite 12/10/2015, 2:57 PM

## 2015-12-10 NOTE — Progress Notes (Signed)
Subjective: Stable and alert.  Denies abdominal pain. No stool or flatus NG output bilious, 2300 mL for last 24 hours, remains high output. Temp elevation to 101.2 noted.  Afebrile now.  Has nonproductive cough.  Objective: Vital signs in last 24 hours: Temp:  [98.1 F (36.7 C)-101.2 F (38.4 C)] 98.1 F (36.7 C) (10/28 0523) Pulse Rate:  [96-113] 104 (10/28 0523) Resp:  [16-22] 17 (10/28 0523) BP: (149-174)/(80-101) 150/90 (10/28 0523) SpO2:  [94 %-99 %] 99 % (10/28 0523) Last BM Date: 12/06/15  Intake/Output from previous day: 10/27 0701 - 10/28 0700 In: 2750 [P.O.:240; I.V.:2400; NG/GT:60; IV Piggyback:50] Out: 2975 [Urine:525; Emesis/NG output:2300; Stool:150] Intake/Output this shift: No intake/output data recorded.  General appearance: Alert and cooperative.  Does not appear toxic or in any distress.  Has lots of questions. Resp: Basically clear.  I do not hear any rhonchi or wheeze.  Somewhat decreased breath sounds at bases posteriorly. GI: still distended but soft, not tight, nontender.  Hyperactive bowel sounds, somewhat high-pitched.  No mass.  No hernia  Lab Results:   Recent Labs  12/08/15 0409 12/09/15 0601  WBC 7.8 5.1  HGB 14.7 15.9  HCT 41.7 45.5  PLT 125* 133*   BMET  Recent Labs  12/08/15 0409 12/09/15 0601  NA 140 142  K 3.7 3.7  CL 105 104  CO2 27 27  GLUCOSE 119* 128*  BUN 21* 22*  CREATININE 1.46* 1.59*  CALCIUM 8.6* 8.9   PT/INR No results for input(s): LABPROT, INR in the last 72 hours. ABG No results for input(s): PHART, HCO3 in the last 72 hours.  Invalid input(s): PCO2, PO2  Studies/Results: Dg Abd 2 Views  Result Date: 12/10/2015 CLINICAL DATA:  Small bowel obstruction EXAM: ABDOMEN - 2 VIEW COMPARISON:  12/09/2015 FINDINGS: Esophageal tube tip is in the upper abdomen. Persistent gaseous dilatation of small bowel loops compatible with bowel obstruction. Scattered distal bowel gas is present. No free air. IMPRESSION:  Persistent gaseous dilatation of central small bowel loops with multiple fluid levels compatible with continued small bowel obstruction. Electronically Signed   By: Donavan Foil M.D.   On: 12/10/2015 04:30   Dg Abd 2 Views  Result Date: 12/08/2015 CLINICAL DATA:  Abdominal pain, nausea EXAM: ABDOMEN - 2 VIEW COMPARISON:  12/07/2015 FINDINGS: Persistent gaseous distended small bowel loops mid abdomen with air-fluid levels consistent with small bowel obstruction. Moderate gas and fluid noted within stomach. IMPRESSION: Persistent gaseous distended small bowel loops mid abdomen with air-fluid level consistent with small bowel obstruction. Electronically Signed   By: Lahoma Crocker M.D.   On: 12/08/2015 11:06   Dg Abd Portable 1v-small Bowel Obstruction Protocol-initial, 8 Hr Delay  Result Date: 12/09/2015 CLINICAL DATA:  [name] protocol 8 hour delay after Gastrografin EXAM: PORTABLE ABDOMEN - 1 VIEW COMPARISON:  CT 12/07/2015, radiograph 12/08/2015, FINDINGS: Esophageal tube tip overlies the distal stomach. Persistent mild gas-filled loops of dilated central small bowel, not significantly changed compared with most recent prior but slightly improved since the CT. No significant contrast is present within the bowel. IMPRESSION: Mild gaseous dilatation of central small bowel loops suggests partial obstruction. Overall gas pattern has slightly improved since the CT scan. Electronically Signed   By: Donavan Foil M.D.   On: 12/09/2015 02:31   Dg Abd Portable 1v-small Bowel Protocol-position Verification  Result Date: 12/08/2015 CLINICAL DATA:  NG placement EXAM: PORTABLE ABDOMEN - 1 VIEW COMPARISON:  12/08/2015 FINDINGS: NG tube passes through the stomach with the tip near the  pylorus. Interval decompression of the stomach. Dilated small bowel loops somewhat improved from earlier today. IMPRESSION: Satisfactory NG tube placement with decompression of the stomach. Improvement in small bowel dilatation.  Electronically Signed   By: Franchot Gallo M.D.   On: 12/08/2015 15:38    Anti-infectives: Anti-infectives    Start     Dose/Rate Route Frequency Ordered Stop   12/10/15 0800  piperacillin-tazobactam (ZOSYN) IVPB 3.375 g     3.375 g 12.5 mL/hr over 240 Minutes Intravenous Every 8 hours 12/10/15 0746        Assessment/Plan: s/p Procedure(s): EXPLORATORY LAPAROTOMY FOR [name]  [name], with no prior abdominal surgical history. I recommended and he agrees to laparotomy for [name] today.  Dr. Susy Frizzle will be the surgeon Differential diagnosis discussed, including adhesions, internal hernia, neoplasia. The risks outlined including bleeding, infection, wound healing problems such as hernia or dehiscence, possible colostomy, injury to adjacent organs.  He understands all these issues.  All of his questions are answered.  He agrees with this plan. OR notified  Fever.  Etiology and significance unclear.  Portable chest x-ray ordered  History transurethral prostate surgery Hypertension.  Marginal control Chronic kidney disease stage 3 - Cr  1.59  History bradycardia History of sleep apnea Glaucoma Thrombocytopenia-128K-safe for surgery.    LOS: 3 days    Ranell Finelli M 12/10/2015

## 2015-12-10 NOTE — Progress Notes (Signed)
PROGRESS NOTE    KERWIN POEHLMAN  [id number] DOB: 17-Oct-1940 DOA: 12/07/2015 PCP: Noralee Space, MD   Brief Narrative: John [name] is an 75 y.o. male who was admitted to the surgical service today for a diagnosis of small bowel obstruction, patient has been placed on nothing by mouth, hydromorphone IV for pain control. Over the course of the day his blood pressure has become uncontrolled with systolic between [id number] and [id number]. Reports having abdominal pain, 8 out of 10 in intensity, dull in nature, no radiation, improved with pain medication, no worsening factors, no associated nausea or vomiting. Patient took his antihypertensive agents yesterday, losartan. His blood pressure has been uncontrolled as an outpatient.    Assessment & Plan:   Active Problems:   Essential hypertension   Sickle cell trait (HCC)   [name] (small bowel obstruction)  1-HTN, uncontrolled. Unable to use oral home regimen, patient is not tolerating oral medications.  Continue with PRN hydralazine.  Start clonidine patch.  IV metoprolol/   2-[name]; per surgery. NG tube in place.  SBP protocol.  IV Pepcid, GI prophylaxis.  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 He denies history of chest pain on exertion. Will order ECHO. He denies chest pain.  Echo with normal Ef 65 %. No wall motion abnormalities. Patient is at moderate risk for procedure due to age, HTN, CKD.  Underwent exploratory laparotomy 10-18, with lysis of adhesions.  Continue with IV fluids.   3-hiccup; thorazine PRN   4-Chronic kidney disease stage 3;  Continue with IV fluids.  Cr last year 1.4--1.6 Follow trend  5-Thrombocytopenia; follow trend.  6-fever; spike fever overnight. Started IV zosyn.   Thank you for this consultation.  Our [facility] hospitalist team will follow the patient with you.  DVT prophylaxis: heparin  Code Status: full code.  Family Communication: care discussed with patient.    Disposition Plan: remain inpatient     Procedures:   none   Antimicrobials:  none  Subjective: Having some abdominal discomfort, just came from sx/    Objective: Vitals:   12/10/15 1134 12/10/15 1148 12/10/15 1204 12/10/15 1215  BP: (!) 190/99 (!) 162/97 (!) 160/92 (!) 188/102  Pulse: 89 86 89 91  Resp: 14 13 13 14   Temp:  98.9 F (37.2 C)  97.3 F (36.3 C)  TempSrc:    Oral  SpO2: 100% 100% 100% 94%  Weight:      Height:        Intake/Output Summary (Last 24 hours) at 12/10/15 1406 Last data filed at 12/10/15 1148  Gross per 24 hour  Intake             3450 ml  Output             3875 ml  Net             -425 ml   Filed Weights   12/07/15 0021  Weight: 99.8 kg (220 lb)    Examination:  General exam: Appears calm and comfortable  Respiratory system: Clear to auscultation. Respiratory effort normal. Cardiovascular system: S1 & S2 heard, RRR. No JVD, murmurs, rubs, gallops or clicks. No pedal edema. Gastrointestinal system: Abdomen is distended, soft and mild tender. No organomegaly or masses felt. Clean dressing mid abdomen.  Central nervous system: Alert and oriented. No focal neurological deficits. Extremities: Symmetric 5 x 5 power. Skin: No rashes, lesions or ulcers Psychiatry: Judgement and insight appear normal. Mood & affect appropriate.  Data Reviewed: I have personally reviewed following labs and imaging studies  CBC:  Recent Labs Lab 12/07/15 0030 12/08/15 0409 12/09/15 0601 12/10/15 0805  WBC 8.3 7.8 5.1 6.3  HGB 14.4 14.7 15.9 13.6  HCT 40.8 41.7 45.5 39.6  MCV 75.4* 76.2* 76.5* 76.2*  PLT 126* 125* 133* [id number]*   Basic Metabolic Panel:  Recent Labs Lab 12/07/15 0030 12/08/15 0409 12/09/15 0601 12/10/15 0805  NA 139 140 142 143  K 3.9 3.7 3.7 3.3*  CL 105 105 104 110  CO2 22 27 27 25   GLUCOSE 165* 119* 128* 102*  BUN 17 21* 22* 25*  CREATININE 1.64* 1.46* 1.59* 1.70*  CALCIUM 9.3 8.6* 8.9 8.8*   GFR: Estimated  Creatinine Clearance: 46.9 mL/min (by C-G formula based on SCr of 1.7 mg/dL (H)). Liver Function Tests:  Recent Labs Lab 12/07/15 0030  AST 22  ALT 15*  ALKPHOS 72  BILITOT 1.0  PROT 7.3  ALBUMIN 3.9    Recent Labs Lab 12/07/15 0030  LIPASE 19   No results for input(s): AMMONIA in the last 168 hours. Coagulation Profile: No results for input(s): INR, PROTIME in the last 168 hours. Cardiac Enzymes: No results for input(s): CKTOTAL, CKMB, CKMBINDEX, TROPONINI in the last 168 hours. BNP (last 3 results) No results for input(s): PROBNP in the last 8760 hours. HbA1C: No results for input(s): HGBA1C in the last 72 hours. CBG:  Recent Labs Lab 12/09/15 1104  GLUCAP 125*   Lipid Profile: No results for input(s): CHOL, HDL, LDLCALC, TRIG, CHOLHDL, LDLDIRECT in the last 72 hours. Thyroid Function Tests: No results for input(s): TSH, T4TOTAL, FREET4, T3FREE, THYROIDAB in the last 72 hours. Anemia Panel: No results for input(s): VITAMINB12, FOLATE, FERRITIN, TIBC, IRON, RETICCTPCT in the last 72 hours. Sepsis Labs: No results for input(s): PROCALCITON, LATICACIDVEN in the last 168 hours.  Recent Results (from the past 240 hour(s))  MRSA PCR Screening     Status: None   Collection Time: 12/10/15  8:04 AM  Result Value Ref Range Status   MRSA by PCR NEGATIVE NEGATIVE Final    Comment:        The GeneXpert MRSA Assay (FDA approved for NASAL specimens only), is one component of a comprehensive MRSA colonization surveillance program. It is not intended to diagnose MRSA infection nor to guide or monitor treatment for MRSA infections.          Radiology Studies: Dg Chest Port 1 View  Result Date: 12/10/2015 CLINICAL DATA:  Fever EXAM: PORTABLE CHEST 1 VIEW COMPARISON:  11/16/2015 chest radiograph. FINDINGS: Enteric tube terminates in the distal body of the stomach. Stable cardiomediastinal silhouette with normal heart size. No pneumothorax. No pleural effusion.  Lungs appear clear, with no acute consolidative airspace disease and no pulmonary edema. IMPRESSION: 1. Enteric tube terminates in the body of the stomach . 2. No active cardiopulmonary disease. Electronically Signed   By: Ilona Sorrel M.D.   On: 12/10/2015 08:43   Dg Abd 2 Views  Result Date: 12/10/2015 CLINICAL DATA:  Small bowel obstruction EXAM: ABDOMEN - 2 VIEW COMPARISON:  12/09/2015 FINDINGS: Esophageal tube tip is in the upper abdomen. Persistent gaseous dilatation of small bowel loops compatible with bowel obstruction. Scattered distal bowel gas is present. No free air. IMPRESSION: Persistent gaseous dilatation of central small bowel loops with multiple fluid levels compatible with continued small bowel obstruction. Electronically Signed   By: Donavan Foil M.D.   On: 12/10/2015 04:30   Dg Abd Portable 1v-small  Bowel Obstruction Protocol-initial, 8 Hr Delay  Result Date: 12/09/2015 CLINICAL DATA:  [name] protocol 8 hour delay after Gastrografin EXAM: PORTABLE ABDOMEN - 1 VIEW COMPARISON:  CT 12/07/2015, radiograph 12/08/2015, FINDINGS: Esophageal tube tip overlies the distal stomach. Persistent mild gas-filled loops of dilated central small bowel, not significantly changed compared with most recent prior but slightly improved since the CT. No significant contrast is present within the bowel. IMPRESSION: Mild gaseous dilatation of central small bowel loops suggests partial obstruction. Overall gas pattern has slightly improved since the CT scan. Electronically Signed   By: Donavan Foil M.D.   On: 12/09/2015 02:31   Dg Abd Portable 1v-small Bowel Protocol-position Verification  Result Date: 12/08/2015 CLINICAL DATA:  NG placement EXAM: PORTABLE ABDOMEN - 1 VIEW COMPARISON:  12/08/2015 FINDINGS: NG tube passes through the stomach with the tip near the pylorus. Interval decompression of the stomach. Dilated small bowel loops somewhat improved from earlier today. IMPRESSION: Satisfactory NG tube  placement with decompression of the stomach. Improvement in small bowel dilatation. Electronically Signed   By: Franchot Gallo M.D.   On: 12/08/2015 15:38        Scheduled Meds: . brimonidine  1 drop Both Eyes BID  . cloNIDine  0.1 mg Transdermal Weekly  . famotidine (PEPCID) IV  20 mg Intravenous Q12H  . heparin  5,000 Units Subcutaneous Q8H  . HYDROmorphone      . latanoprost  1 drop Both Eyes QHS  . losartan  100 mg Oral Daily  . piperacillin-tazobactam (ZOSYN)  IV  3.375 g Intravenous Q8H  . polyvinyl alcohol  1 drop Both Eyes 6 X Daily   Continuous Infusions: . sodium chloride 100 mL/hr at 12/10/15 1242  . lactated ringers Stopped (12/10/15 1242)     LOS: 3 days    Time spent: 25  minutes   Elmarie Shiley, MD [facility] Pager [phone]  If 7PM-7AM, please contact night-coverage [email or web address] Password [email or web address] 12/10/2015, 2:06 PM

## 2015-12-10 NOTE — Anesthesia Procedure Notes (Signed)
Procedure Name: Intubation Date/Time: 12/10/2015 9:45 AM Performed by: Lance Coon Pre-anesthesia Checklist: Patient identified, Emergency Drugs available, Suction available, Patient being monitored and Timeout performed Patient Re-evaluated:Patient Re-evaluated prior to inductionOxygen Delivery Method: Circle system utilized Preoxygenation: Pre-oxygenation with 100% oxygen Intubation Type: IV induction, Rapid sequence and Cricoid Pressure applied Laryngoscope Size: Miller and 3 Grade View: Grade III Tube type: Oral Tube size: 7.5 mm Number of attempts: 1 Airway Equipment and Method: Bougie stylet Placement Confirmation: positive ETCO2 and breath sounds checked- equal and bilateral Secured at: 23 cm Tube secured with: Tape Dental Injury: Teeth and Oropharynx as per pre-operative assessment

## 2015-12-10 NOTE — Anesthesia Postprocedure Evaluation (Signed)
Anesthesia Post Note  Patient: John [name]  Procedure(s) Performed: Procedure(s) (LRB): EXPLORATORY LAPAROTOMY FOR [name] (N/A)  Patient location during evaluation: PACU Anesthesia Type: General Level of consciousness: awake Pain management: pain level controlled Vital Signs Assessment: post-procedure vital signs reviewed and stable Respiratory status: spontaneous breathing Cardiovascular status: stable Postop Assessment: no signs of nausea or vomiting Anesthetic complications: no    Last Vitals:  Vitals:   12/10/15 1215 12/10/15 2304  BP: (!) 188/102 (!) 170/97  Pulse: 91 87  Resp: 14 16  Temp: 36.3 C 36.9 C    Last Pain:  Vitals:   12/10/15 2304  TempSrc: Oral  PainSc:                  [name]

## 2015-12-11 LAB — CBC
HEMATOCRIT: 38.2 % — AB (ref 39.0–52.0)
HEMOGLOBIN: 13.6 g/dL (ref 13.0–17.0)
MCH: 26.8 pg (ref 26.0–34.0)
MCHC: 35.6 g/dL (ref 30.0–36.0)
MCV: 75.2 fL — AB (ref 78.0–100.0)
Platelets: 123 10*3/uL — ABNORMAL LOW (ref 150–400)
RBC: 5.08 MIL/uL (ref 4.22–5.81)
RDW: 14.4 % (ref 11.5–15.5)
WBC: 6.7 10*3/uL (ref 4.0–10.5)

## 2015-12-11 LAB — BASIC METABOLIC PANEL
ANION GAP: 7 (ref 5–15)
BUN: 23 mg/dL — ABNORMAL HIGH (ref 6–20)
CHLORIDE: 111 mmol/L (ref 101–111)
CO2: 24 mmol/L (ref 22–32)
CREATININE: 1.42 mg/dL — AB (ref 0.61–1.24)
Calcium: 8.4 mg/dL — ABNORMAL LOW (ref 8.9–10.3)
GFR calc non Af Amer: 48 mL/min — ABNORMAL LOW (ref >60)
GFR, EST AFRICAN AMERICAN: 55 mL/min — AB (ref >60)
Glucose, Bld: 120 mg/dL — ABNORMAL HIGH (ref 65–99)
POTASSIUM: 3.5 mmol/L (ref 3.5–5.1)
SODIUM: 142 mmol/L (ref 135–145)

## 2015-12-11 NOTE — Progress Notes (Signed)
1 Day Post-Op  Subjective: Sitting up in chair. Complains of sore throat from NG tube Foley catheter just removed No respiratory complaints Hemoglobin 13.6.  WBC 6700.  Potassium 3.5.  Creatinine 1.42, stable.  Glucose 120.  Objective: Vital signs in last 24 hours: Temp:  [97.3 F (36.3 C)-99 F (37.2 C)] 98.7 F (37.1 C) (10/29 0518) Pulse Rate:  [85-91] 85 (10/29 0518) Resp:  [13-17] 17 (10/29 0518) BP: (158-203)/(86-106) 159/86 (10/29 0518) SpO2:  [94 %-100 %] 97 % (10/29 0518) Last BM Date: 12/06/15  Intake/Output from previous day: 10/28 0701 - 10/29 0700 In: 2743.3 [P.O.:120; I.V.:2423.3; IV Piggyback:200] Out: 2625 [Urine:1925; Emesis/NG output:500; Blood:50] Intake/Output this shift: Total I/O In: 0  Out: 100 [Urine:100]  General appearance: Alert and oriented.  Friendly.  Mild distress.  Wife present. Resp: clear to auscultation bilaterally.  Vital capacity 1500 mL. GI: Soft.  Distended.  Silent.  Wound clean and intact.  Lab Results:  Results for orders placed or performed during the hospital encounter of 12/07/15 (from the past 24 hour(s))  Basic metabolic panel     Status: Abnormal   Collection Time: 12/11/15  3:51 AM  Result Value Ref Range   Sodium 142 135 - 145 mmol/L   Potassium 3.5 3.5 - 5.1 mmol/L   Chloride 111 101 - 111 mmol/L   CO2 24 22 - 32 mmol/L   Glucose, Bld 120 (H) 65 - 99 mg/dL   BUN 23 (H) 6 - 20 mg/dL   Creatinine, Ser 1.42 (H) 0.61 - 1.24 mg/dL   Calcium 8.4 (L) 8.9 - 10.3 mg/dL   GFR calc non Af Amer 48 (L) >60 mL/min   GFR calc Af Amer 55 (L) >60 mL/min   Anion gap 7 5 - 15  CBC     Status: Abnormal   Collection Time: 12/11/15  3:51 AM  Result Value Ref Range   WBC 6.7 4.0 - 10.5 K/uL   RBC 5.08 4.22 - 5.81 MIL/uL   Hemoglobin 13.6 13.0 - 17.0 g/dL   HCT 38.2 (L) 39.0 - 52.0 %   MCV 75.2 (L) 78.0 - 100.0 fL   MCH 26.8 26.0 - 34.0 pg   MCHC 35.6 30.0 - 36.0 g/dL   RDW 14.4 11.5 - 15.5 %   Platelets 123 (L) 150 - 400 K/uL      Studies/Results: No results found.  . brimonidine  1 drop Both Eyes BID  . cloNIDine  0.1 mg Transdermal Weekly  . famotidine (PEPCID) IV  20 mg Intravenous Q12H  . heparin  5,000 Units Subcutaneous Q8H  . latanoprost  1 drop Both Eyes QHS  . piperacillin-tazobactam (ZOSYN)  IV  3.375 g Intravenous Q8H  . polyvinyl alcohol  1 drop Both Eyes 6 X Daily     Assessment/Plan: s/p Procedure(s): EXPLORATORY LAPAROTOMY FOR [name]   POD #1.  Laparotomy and lysis of adhesions for adhesive [name]. Stable Mobilize Incentive spirometry Continue NG drainage until ileus resolved.  Appreciate management assistance by Dr. rig although from [facility] hospitalist.  History transurethral prostate surgery Hypertension.  Marginal control Chronic kidney disease stage 3 - Cr  1.59  History bradycardia History of sleep apnea Glaucoma Thrombocytopenia-128K-safe for surgery.  @PROBHOSP @  LOS: 4 days    John [name] M 12/11/2015  . .prob

## 2015-12-11 NOTE — Progress Notes (Signed)
PROGRESS NOTE    John [name]  [id number] DOB: 06/24/1940 DOA: 12/07/2015 PCP: Noralee Space, MD   Brief Narrative: John [name] is an 75 y.o. male who was admitted to the surgical service today for a diagnosis of small bowel obstruction, patient has been placed on nothing by mouth, hydromorphone IV for pain control. Over the course of the day his blood pressure has become uncontrolled with systolic between Q000111Q and [id number]. Reports having abdominal pain, 8 out of 10 in intensity, dull in nature, no radiation, improved with pain medication, no worsening factors, no associated nausea or vomiting. Patient took his antihypertensive agents yesterday, losartan. His blood pressure has been uncontrolled as an outpatient.    Assessment & Plan:   Active Problems:   Essential hypertension   Sickle cell trait (HCC)   [name] (small bowel obstruction)  1-HTN, uncontrolled. Unable to use oral home regimen, patient is not tolerating oral medications.  Continue with PRN hydralazine.  Started  clonidine patch.  IV metoprolol/  Start oral medications when tolerating oral.   2-[name]; per surgery. NG tube in place.  SBP protocol.  IV Pepcid, GI prophylaxis.  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 He denies history of chest pain on exertion. Will order ECHO. He denies chest pain.  Echo with normal Ef 65 %. No wall motion abnormalities. Patient is at moderate risk for procedure due to age, HTN, CKD.  Underwent exploratory laparotomy 10-18, with lysis of adhesions.  Continue with IV fluids.   3-hiccup; thorazine PRN   4-Chronic kidney disease stage 3;  Continue with IV fluids.  Cr last year 1.4--1.6 Follow trend, cr trending down.   5-Thrombocytopenia; follow trend.   6-fever; spike fever the night of 10-27. Started IV zosyn.  Chest x ray negative, will discontinue antibiotics and monitor.    Thank you for this consultation.  Our [facility] hospitalist  team will follow the patient with you.  DVT prophylaxis: heparin  Code Status: full code.  Family Communication: care discussed with patient.  Disposition Plan: remain inpatient     Procedures:   none   Antimicrobials:  none  Subjective: He is sitting in the recliner. Report mild abdominal discomfort.  He has not pas gas.    Objective: Vitals:   12/10/15 1215 12/10/15 2304 12/10/15 2345 12/11/15 0518  BP: (!) 188/102 (!) 170/97 (!) 158/90 (!) 159/86  Pulse: 91 87  85  Resp: 14 16  17   Temp: 97.3 F (36.3 C) 98.4 F (36.9 C)  98.7 F (37.1 C)  TempSrc: Oral Oral  Oral  SpO2: 94% 97%  97%  Weight:      Height:        Intake/Output Summary (Last 24 hours) at 12/11/15 0948 Last data filed at 12/11/15 [id number]  Gross per 24 hour  Intake          2743.33 ml  Output             2475 ml  Net           268.33 ml   Filed Weights   12/07/15 0021  Weight: 99.8 kg (220 lb)    Examination:  General exam: Appears calm and comfortable. NG tube in place.  Respiratory system: Clear to auscultation. Respiratory effort normal. Cardiovascular system: S1 & S2 heard, RRR. No JVD, murmurs, rubs, gallops or clicks. No pedal edema. Gastrointestinal system: Abdomen is distended, soft and mild tender. No organomegaly or masses felt. Clean  dressing mid abdomen.  Central nervous system: Alert and oriented. No focal neurological deficits. Extremities: Symmetric 5 x 5 power. Skin: No rashes, lesions or ulcers Psychiatry: Judgement and insight appear normal. Mood & affect appropriate.     Data Reviewed: I have personally reviewed following labs and imaging studies  CBC:  Recent Labs Lab 12/07/15 0030 12/08/15 0409 12/09/15 0601 12/10/15 0805 12/11/15 0351  WBC 8.3 7.8 5.1 6.3 6.7  HGB 14.4 14.7 15.9 13.6 13.6  HCT 40.8 41.7 45.5 39.6 38.2*  MCV 75.4* 76.2* 76.5* 76.2* 75.2*  PLT 126* 125* 133* 121* [id number]*   Basic Metabolic Panel:  Recent Labs Lab 12/07/15 0030  12/08/15 0409 12/09/15 0601 12/10/15 0805 12/11/15 0351  NA 139 140 142 143 142  K 3.9 3.7 3.7 3.3* 3.5  CL 105 105 104 110 111  CO2 22 27 27 25 24   GLUCOSE 165* 119* 128* 102* 120*  BUN 17 21* 22* 25* 23*  CREATININE 1.64* 1.46* 1.59* 1.70* 1.42*  CALCIUM 9.3 8.6* 8.9 8.8* 8.4*   GFR: Estimated Creatinine Clearance: 56.2 mL/min (by C-G formula based on SCr of 1.42 mg/dL (H)). Liver Function Tests:  Recent Labs Lab 12/07/15 0030  AST 22  ALT 15*  ALKPHOS 72  BILITOT 1.0  PROT 7.3  ALBUMIN 3.9    Recent Labs Lab 12/07/15 0030  LIPASE 19   No results for input(s): AMMONIA in the last 168 hours. Coagulation Profile: No results for input(s): INR, PROTIME in the last 168 hours. Cardiac Enzymes: No results for input(s): CKTOTAL, CKMB, CKMBINDEX, TROPONINI in the last 168 hours. BNP (last 3 results) No results for input(s): PROBNP in the last 8760 hours. HbA1C: No results for input(s): HGBA1C in the last 72 hours. CBG:  Recent Labs Lab 12/09/15 1104  GLUCAP 125*   Lipid Profile: No results for input(s): CHOL, HDL, LDLCALC, TRIG, CHOLHDL, LDLDIRECT in the last 72 hours. Thyroid Function Tests: No results for input(s): TSH, T4TOTAL, FREET4, T3FREE, THYROIDAB in the last 72 hours. Anemia Panel: No results for input(s): VITAMINB12, FOLATE, FERRITIN, TIBC, IRON, RETICCTPCT in the last 72 hours. Sepsis Labs: No results for input(s): PROCALCITON, LATICACIDVEN in the last 168 hours.  Recent Results (from the past 240 hour(s))  MRSA PCR Screening     Status: None   Collection Time: 12/10/15  8:04 AM  Result Value Ref Range Status   MRSA by PCR NEGATIVE NEGATIVE Final    Comment:        The GeneXpert MRSA Assay (FDA approved for NASAL specimens only), is one component of a comprehensive MRSA colonization surveillance program. It is not intended to diagnose MRSA infection nor to guide or monitor treatment for MRSA infections.          Radiology  Studies: Dg Chest Port 1 View  Result Date: 12/10/2015 CLINICAL DATA:  Fever EXAM: PORTABLE CHEST 1 VIEW COMPARISON:  11/16/2015 chest radiograph. FINDINGS: Enteric tube terminates in the distal body of the stomach. Stable cardiomediastinal silhouette with normal heart size. No pneumothorax. No pleural effusion. Lungs appear clear, with no acute consolidative airspace disease and no pulmonary edema. IMPRESSION: 1. Enteric tube terminates in the body of the stomach . 2. No active cardiopulmonary disease. Electronically Signed   By: Ilona Sorrel M.D.   On: 12/10/2015 08:43   Dg Abd 2 Views  Result Date: 12/10/2015 CLINICAL DATA:  Small bowel obstruction EXAM: ABDOMEN - 2 VIEW COMPARISON:  12/09/2015 FINDINGS: Esophageal tube tip is in the upper abdomen. Persistent gaseous  dilatation of small bowel loops compatible with bowel obstruction. Scattered distal bowel gas is present. No free air. IMPRESSION: Persistent gaseous dilatation of central small bowel loops with multiple fluid levels compatible with continued small bowel obstruction. Electronically Signed   By: Donavan Foil M.D.   On: 12/10/2015 04:30        Scheduled Meds: . brimonidine  1 drop Both Eyes BID  . cloNIDine  0.1 mg Transdermal Weekly  . famotidine (PEPCID) IV  20 mg Intravenous Q12H  . heparin  5,000 Units Subcutaneous Q8H  . latanoprost  1 drop Both Eyes QHS  . piperacillin-tazobactam (ZOSYN)  IV  3.375 g Intravenous Q8H  . polyvinyl alcohol  1 drop Both Eyes 6 X Daily   Continuous Infusions: . sodium chloride 100 mL/hr at 12/10/15 2217     LOS: 4 days    Time spent: 25  minutes   Elmarie Shiley, MD [facility] Pager [phone]  If 7PM-7AM, please contact night-coverage [email or web address] Password [email or web address] 12/11/2015, 9:48 AM

## 2015-12-12 ENCOUNTER — Encounter (HOSPITAL_COMMUNITY): Payer: Self-pay | Admitting: Surgery

## 2015-12-12 LAB — CBC
HCT: 37.9 % — ABNORMAL LOW (ref 39.0–52.0)
HEMOGLOBIN: 13.2 g/dL (ref 13.0–17.0)
MCH: 26.9 pg (ref 26.0–34.0)
MCHC: 34.8 g/dL (ref 30.0–36.0)
MCV: 77.2 fL — ABNORMAL LOW (ref 78.0–100.0)
PLATELETS: 125 10*3/uL — AB (ref 150–400)
RBC: 4.91 MIL/uL (ref 4.22–5.81)
RDW: 15 % (ref 11.5–15.5)
WBC: 6.9 10*3/uL (ref 4.0–10.5)

## 2015-12-12 LAB — BASIC METABOLIC PANEL
Anion gap: 6 (ref 5–15)
BUN: 25 mg/dL — AB (ref 6–20)
CALCIUM: 8.7 mg/dL — AB (ref 8.9–10.3)
CHLORIDE: 114 mmol/L — AB (ref 101–111)
CO2: 24 mmol/L (ref 22–32)
CREATININE: 1.36 mg/dL — AB (ref 0.61–1.24)
GFR calc Af Amer: 58 mL/min — ABNORMAL LOW (ref >60)
GFR calc non Af Amer: 50 mL/min — ABNORMAL LOW (ref >60)
Glucose, Bld: 97 mg/dL (ref 65–99)
Potassium: 3.5 mmol/L (ref 3.5–5.1)
SODIUM: 144 mmol/L (ref 135–145)

## 2015-12-12 MED ORDER — HYDRALAZINE HCL 20 MG/ML IJ SOLN
10.0000 mg | INTRAMUSCULAR | Status: DC | PRN
  Administered 2015-12-13 – 2015-12-14 (×3): 10 mg via INTRAVENOUS
  Filled 2015-12-12 (×3): qty 1

## 2015-12-12 NOTE — Evaluation (Signed)
Physical Therapy Evaluation Patient Details Name: John [name] MRN: [id number] DOB: 04-14-1940 Today's Date: 12/12/2015   History of Present Illness  Admitted 10/25 with abdominal distention and progressive pain, [name]; s/p exp lapwith lysis of adhesions 10/28;  has a past medical history of Anxiety; Glaucoma; Gynecomastia; Hemorrhoids; adenomatous colonic polyps; Hypercholesteremia; Hyperplasia of prostate with urinary obstruction; Hypertension; Premature ventricular contractions; Prostate cancerSleep apnea; and Urinary retention.  Clinical Impression   Patient is s/p above surgery resulting in functional limitations due to the deficits listed below (see PT Problem List). Worked on bed mobility and progressive ambulation; Anticipate good progress; Will need stair practice (flight to reach bedroom);  Patient will benefit from skilled PT to increase their independence and safety with mobility to allow discharge to the venue listed below.       Follow Up Recommendations No PT follow up;Supervision - Intermittent    Equipment Recommendations  Rolling walker with 5" wheels;3in1 (PT)    Recommendations for Other Services       Precautions / Restrictions Precautions Precaution Comments: Log roll technique for better ease with bed mobility; NG tube      Mobility  Bed Mobility Overal bed mobility: Needs Assistance Bed Mobility: Rolling;Sidelying to Sit;Sit to Sidelying Rolling: Min guard Sidelying to sit: Min assist     Sit to sidelying: Min assist General bed mobility comments: Taught log roll technique for better comfort wiht getting up from flat bed  Transfers Overall transfer level: Needs assistance Equipment used: Rolling walker (2 wheeled) Transfers: Sit to/from Stand Sit to Stand: Min guard (without physical contact)         General transfer comment: cues for hand placement and safety  Ambulation/Gait Ambulation/Gait assistance: Supervision Ambulation Distance  (Feet): 550 Feet Assistive device: Rolling walker (2 wheeled) Gait Pattern/deviations: Step-through pattern;Trunk flexed     General Gait Details: Cues to self-monitor for activity tolerance  Stairs            Wheelchair Mobility    Modified Rankin (Stroke Patients Only)       Balance                                             Pertinent Vitals/Pain Pain Assessment: 0-10 Pain Score: 5  Pain Location: abdomen Pain Descriptors / Indicators: Aching Pain Intervention(s): Limited activity within patient's tolerance;Monitored during session;Repositioned    Home Living Family/patient expects to be discharged to:: Private residence Living Arrangements: Spouse/significant other Available Help at Discharge: Family;Available PRN/intermittently Type of Home: House Home Access: Stairs to enter     Home Layout: Two level;Bed/bath upstairs Home Equipment: Shower seat      Prior Function Level of Independence: Independent               Hand Dominance        Extremity/Trunk Assessment   Upper Extremity Assessment: Overall WFL for tasks assessed           Lower Extremity Assessment: Overall WFL for tasks assessed         Communication   Communication: No difficulties  Cognition Arousal/Alertness: Awake/alert Behavior During Therapy: WFL for tasks assessed/performed Overall Cognitive Status: Within Functional Limits for tasks assessed                      General Comments      Exercises  Assessment/Plan    PT Assessment Patient needs continued PT services  PT Problem List Decreased activity tolerance;Pain;Decreased mobility;Decreased knowledge of use of DME;Decreased knowledge of precautions          PT Treatment Interventions DME instruction;Gait training;Stair training;Functional mobility training;Therapeutic activities;Therapeutic exercise;Patient/family education    PT Goals (Current goals can be found in the  Care Plan section)  Acute Rehab PT Goals Patient Stated Goal: get better PT Goal Formulation: With patient Time For Goal Achievement: 12/26/15 Potential to Achieve Goals: Good    Frequency Min 3X/week   Barriers to discharge        Co-evaluation               End of Session   Activity Tolerance: Patient tolerated treatment well Patient left: in chair;with call bell/phone within reach Nurse Communication: Mobility status         Time: [id number] PT Time Calculation (min) (ACUTE ONLY): 32 min   Charges:   PT Evaluation $PT Eval Moderate Complexity: 1 Procedure PT Treatments $Gait Training: 8-22 mins   PT G CodesQuin Hoop 12/12/2015, 4:52 PM  Roney Marion, [place] Pager [phone] Office [phone]

## 2015-12-12 NOTE — Care Management Important Message (Signed)
Important Message  Patient Details  Name: John [name] MRN: [id number] Date of Birth: [birth year]   Medicare Important Message Given:  Yes    Malky Rudzinski Montine Circle 12/12/2015, 5:23 PM

## 2015-12-12 NOTE — Progress Notes (Signed)
2 Days Post-Op  Subjective: Says he was spitting up all day and night. NG sump filter full of fluid.  Few BS, no flatus or BM.  Site looks good, has not been OOB except to BR.  Some blood from penis has cleared.  He says he is voiding a good deal.    Objective: Vital signs in last 24 hours: Temp:  [98.1 F (36.7 C)-98.7 F (37.1 C)] 98.1 F (36.7 C) (10/30 0448) Pulse Rate:  [73-82] 82 (10/30 0632) Resp:  [17-19] 18 (10/30 0448) BP: (157-176)/(82-95) 159/82 (10/30 0632) SpO2:  [91 %-98 %] 95 % (10/30 0448) Last BM Date: 12/06/15 NG 400 PO 90 IV 2000 Urine 1525 Afebrile, BP up some but better than last week. Creatinine better, WBC normal H/H stable  Intake/Output from previous day: 10/29 0701 - 10/30 0700 In: 2220.4 [P.O.:90; I.V.:1970.4; NG/GT:60; IV Piggyback:100] Out: 1925 [Urine:1525; Emesis/NG output:400] Intake/Output this shift: No intake/output data recorded.  General appearance: alert, cooperative and still uncomfortable, but no distress. Resp: BS down in bases, he had IS in hand when I went in. GI: Few BS, no flatus or BM, NG not working well.  Incision looks fine.    Lab Results:   Recent Labs  12/11/15 0351 12/12/15 0248  WBC 6.7 6.9  HGB 13.6 13.2  HCT 38.2* 37.9*  PLT 123* 125*    BMET  Recent Labs  12/11/15 0351 12/12/15 0248  NA 142 144  K 3.5 3.5  CL 111 114*  CO2 24 24  GLUCOSE 120* 97  BUN 23* 25*  CREATININE 1.42* 1.36*  CALCIUM 8.4* 8.7*   PT/INR No results for input(s): LABPROT, INR in the last 72 hours.   Recent Labs Lab 12/07/15 0030  AST 22  ALT 15*  ALKPHOS 72  BILITOT 1.0  PROT 7.3  ALBUMIN 3.9     Lipase     Component Value Date/Time   LIPASE 19 12/07/2015 0030     Studies/Results: Dg Chest Port 1 View  Result Date: 12/10/2015 CLINICAL DATA:  Fever EXAM: PORTABLE CHEST 1 VIEW COMPARISON:  11/16/2015 chest radiograph. FINDINGS: Enteric tube terminates in the distal body of the stomach. Stable  cardiomediastinal silhouette with normal heart size. No pneumothorax. No pleural effusion. Lungs appear clear, with no acute consolidative airspace disease and no pulmonary edema. IMPRESSION: 1. Enteric tube terminates in the body of the stomach . 2. No active cardiopulmonary disease. Electronically Signed   By: Ilona Sorrel M.D.   On: 12/10/2015 08:43    Medications: . brimonidine  1 drop Both Eyes BID  . cloNIDine  0.1 mg Transdermal Weekly  . famotidine (PEPCID) IV  20 mg Intravenous Q12H  . heparin  5,000 Units Subcutaneous Q8H  . latanoprost  1 drop Both Eyes QHS  . polyvinyl alcohol  1 drop Both Eyes 6 X Daily   . sodium chloride 75 mL/hr at 12/12/15 0631    Bradycardia  Glaucoma History of sleep apnea - no CPAP Lateral renal cyst Lateral renal cyst History of prostate cancer with holistic treatment. Status post TURP   Assessment/Plan [name], with no prior abdominal surgical history. No prior history of [name]. S/p [name], 12/10/15, [name];  POD 2 Hypertension - Poor control - Medicine following on Clonidine patch Chronic kidney disease - improving, Medicine following FEN: NPO/IV fluids ID: No abx DVT: Heparin/SCD  Plan:  Continue NG today, mobilize, NG working now.       LOS: 5 days  Mariama Saintvil 12/12/2015 [phone]

## 2015-12-12 NOTE — Progress Notes (Signed)
PROGRESS NOTE    John [name]  [id number] DOB: 12-01-1940 DOA: 12/07/2015 PCP: Noralee Space, MD   Brief Narrative: John [name] is an 75 y.o. male who was admitted to the surgical service today for a diagnosis of small bowel obstruction, patient has been placed on nothing by mouth, hydromorphone IV for pain control. Over the course of the day his blood pressure has become uncontrolled with systolic between Q000111Q and [id number]. Reports having abdominal pain, 8 out of 10 in intensity, dull in nature, no radiation, improved with pain medication, no worsening factors, no associated nausea or vomiting. Patient took his antihypertensive agents yesterday, losartan. His blood pressure has been uncontrolled as an outpatient.    Assessment & Plan:   Active Problems:   Essential hypertension   Sickle cell trait (HCC)   [name] (small bowel obstruction)  1-HTN, uncontrolled. Unable to use oral home regimen, patient is not tolerating oral medications.  Continue with PRN hydralazine.  Started  clonidine patch.  IV metoprolol/  Start oral medications when tolerating oral. Would use oral hydralazine  2-[name]; per surgery. NG tube in place.  SBP protocol.  IV Pepcid, GI prophylaxis.  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 He denies history of chest pain on exertion. Will order ECHO. He denies chest pain.  Echo with normal Ef 65 %. No wall motion abnormalities. Patient is at moderate risk for procedure due to age, HTN, CKD.  Underwent exploratory laparotomy 10-18, with lysis of adhesions.  Continue with IV fluids.  Awaiting for bowel function to recover.   3-hiccup; thorazine PRN   4-Chronic kidney disease stage 3;  Continue with IV fluids.  Cr last year 1.4--1.6 Follow trend, cr trending down.   5-Thrombocytopenia; follow trend. Stable.    6-fever; spike fever the night of 10-27. Started IV zosyn.  Chest x ray negative, will discontinue  antibiotics and monitor.  Afebrile.   Thank you for this consultation.  Our [facility] hospitalist team will follow the patient with you.  DVT prophylaxis: heparin  Code Status: full code.  Family Communication: care discussed with patient.  Disposition Plan: remain inpatient     Procedures:   none   Antimicrobials:  none  Subjective: He is sitting in the recliner. Report mild abdominal discomfort.  He has not pas gas.    Objective: Vitals:   12/11/15 1820 12/11/15 2043 12/12/15 0448 12/12/15 0632  BP: (!) 176/95 (!) 170/95 (!) 167/88 (!) 159/82  Pulse: 75 76 73 82  Resp: 18 19 18    Temp: 98.7 F (37.1 C) 98.2 F (36.8 C) 98.1 F (36.7 C)   TempSrc: Oral Oral Oral   SpO2: 91% 93% 95%   Weight:      Height:        Intake/Output Summary (Last 24 hours) at 12/12/15 1423 Last data filed at 12/12/15 1231  Gross per 24 hour  Intake          1633.75 ml  Output             1725 ml  Net           -91.25 ml   Filed Weights   12/07/15 0021  Weight: 99.8 kg (220 lb)    Examination:  General exam: Appears calm and comfortable. NG tube in place.  Respiratory system: Clear to auscultation. Respiratory effort normal. Cardiovascular system: S1 & S2 heard, RRR. No JVD, murmurs, rubs, gallops or clicks. No pedal edema. Gastrointestinal system:  Abdomen is distended, soft and mild tender. No organomegaly or masses felt. Clean dressing mid abdomen.  Central nervous system: Alert and oriented. No focal neurological deficits. Extremities: Symmetric 5 x 5 power. Skin: No rashes, lesions or ulcers Psychiatry: Judgement and insight appear normal. Mood & affect appropriate.     Data Reviewed: I have personally reviewed following labs and imaging studies  CBC:  Recent Labs Lab 12/08/15 0409 12/09/15 0601 12/10/15 0805 12/11/15 0351 12/12/15 0248  WBC 7.8 5.1 6.3 6.7 6.9  HGB 14.7 15.9 13.6 13.6 13.2  HCT 41.7 45.5 39.6 38.2* 37.9*  MCV 76.2* 76.5* 76.2* 75.2* 77.2*  PLT  125* 133* 121* 123* 0000000*   Basic Metabolic Panel:  Recent Labs Lab 12/08/15 0409 12/09/15 0601 12/10/15 0805 12/11/15 0351 12/12/15 0248  NA 140 142 143 142 144  K 3.7 3.7 3.3* 3.5 3.5  CL 105 104 110 111 114*  CO2 27 27 25 24 24   GLUCOSE 119* 128* 102* 120* 97  BUN 21* 22* 25* 23* 25*  CREATININE 1.46* 1.59* 1.70* 1.42* 1.36*  CALCIUM 8.6* 8.9 8.8* 8.4* 8.7*   GFR: Estimated Creatinine Clearance: 58.7 mL/min (by C-G formula based on SCr of 1.36 mg/dL (H)). Liver Function Tests:  Recent Labs Lab 12/07/15 0030  AST 22  ALT 15*  ALKPHOS 72  BILITOT 1.0  PROT 7.3  ALBUMIN 3.9    Recent Labs Lab 12/07/15 0030  LIPASE 19   No results for input(s): AMMONIA in the last 168 hours. Coagulation Profile: No results for input(s): INR, PROTIME in the last 168 hours. Cardiac Enzymes: No results for input(s): CKTOTAL, CKMB, CKMBINDEX, TROPONINI in the last 168 hours. BNP (last 3 results) No results for input(s): PROBNP in the last 8760 hours. HbA1C: No results for input(s): HGBA1C in the last 72 hours. CBG:  Recent Labs Lab 12/09/15 1104  GLUCAP 125*   Lipid Profile: No results for input(s): CHOL, HDL, LDLCALC, TRIG, CHOLHDL, LDLDIRECT in the last 72 hours. Thyroid Function Tests: No results for input(s): TSH, T4TOTAL, FREET4, T3FREE, THYROIDAB in the last 72 hours. Anemia Panel: No results for input(s): VITAMINB12, FOLATE, FERRITIN, TIBC, IRON, RETICCTPCT in the last 72 hours. Sepsis Labs: No results for input(s): PROCALCITON, LATICACIDVEN in the last 168 hours.  Recent Results (from the past 240 hour(s))  MRSA PCR Screening     Status: None   Collection Time: 12/10/15  8:04 AM  Result Value Ref Range Status   MRSA by PCR NEGATIVE NEGATIVE Final    Comment:        The GeneXpert MRSA Assay (FDA approved for NASAL specimens only), is one component of a comprehensive MRSA colonization surveillance program. It is not intended to diagnose MRSA infection nor  to guide or monitor treatment for MRSA infections.          Radiology Studies: No results found.      Scheduled Meds: . brimonidine  1 drop Both Eyes BID  . cloNIDine  0.1 mg Transdermal Weekly  . famotidine (PEPCID) IV  20 mg Intravenous Q12H  . heparin  5,000 Units Subcutaneous Q8H  . latanoprost  1 drop Both Eyes QHS  . polyvinyl alcohol  1 drop Both Eyes 6 X Daily   Continuous Infusions: . sodium chloride 75 mL/hr at 12/12/15 0631     LOS: 5 days    Time spent: 25  minutes   Elmarie Shiley, MD [facility] Pager [phone]  If 7PM-7AM, please contact night-coverage [email or web address] Password [email or web address] 12/12/2015,  2:23 PM

## 2015-12-13 ENCOUNTER — Inpatient Hospital Stay (HOSPITAL_COMMUNITY): Payer: Medicare Other

## 2015-12-13 LAB — COMPREHENSIVE METABOLIC PANEL
ALK PHOS: 64 U/L (ref 38–126)
ALT: 24 U/L (ref 17–63)
ANION GAP: 9 (ref 5–15)
AST: 35 U/L (ref 15–41)
Albumin: 3.1 g/dL — ABNORMAL LOW (ref 3.5–5.0)
BUN: 22 mg/dL — ABNORMAL HIGH (ref 6–20)
CALCIUM: 8.9 mg/dL (ref 8.9–10.3)
CO2: 22 mmol/L (ref 22–32)
CREATININE: 1.34 mg/dL — AB (ref 0.61–1.24)
Chloride: 111 mmol/L (ref 101–111)
GFR, EST AFRICAN AMERICAN: 59 mL/min — AB (ref >60)
GFR, EST NON AFRICAN AMERICAN: 51 mL/min — AB (ref >60)
Glucose, Bld: 104 mg/dL — ABNORMAL HIGH (ref 65–99)
Potassium: 3.3 mmol/L — ABNORMAL LOW (ref 3.5–5.1)
SODIUM: 142 mmol/L (ref 135–145)
TOTAL PROTEIN: 6.5 g/dL (ref 6.5–8.1)
Total Bilirubin: 1.5 mg/dL — ABNORMAL HIGH (ref 0.3–1.2)

## 2015-12-13 NOTE — Progress Notes (Signed)
3 Days Post-Op  Subjective: Some flatus, no BS, he looks kind of miserable.  Incision looks fine. Complains of mucus building up in his lungs he is doing incentive spirometry. He's been up and ambulated some yesterday and up in the chair also.  Objective: Vital signs in last 24 hours: Temp:  [98.3 F (36.8 C)-98.6 F (37 C)] 98.5 F (36.9 C) (10/31 0336) Pulse Rate:  [66-76] 66 (10/31 0336) Resp:  [17-19] 19 (10/31 0336) BP: (165-196)/(79-99) 196/99 (10/31 0336) SpO2:  [100 %] 100 % (10/31 0336) Last BM Date: 12/06/15 NPO 1500 IV Urine 480 NG 400 Afebrile, VSS  BP still up No labs today No film    Intake/Output from previous day: 10/30 0701 - 10/31 0700 In: 1486.3 [I.V.:1386.3; IV Piggyback:100] Out: 880 [Urine:480; Emesis/NG output:400] Intake/Output this shift: No intake/output data recorded.  General appearance: alert, cooperative, no distress and Continues to appear very uncomfortable. Resp: clear to auscultation bilaterally and Complains of mucus from his lungs but appears to be able to clear this. GI: Soft, sore, no bowel sounds, having some flatus. Incision looks good. Abdominal binder in place.  Lab Results:   Recent Labs  12/11/15 0351 12/12/15 0248  WBC 6.7 6.9  HGB 13.6 13.2  HCT 38.2* 37.9*  PLT 123* 125*    BMET  Recent Labs  12/11/15 0351 12/12/15 0248  NA 142 144  K 3.5 3.5  CL 111 114*  CO2 24 24  GLUCOSE 120* 97  BUN 23* 25*  CREATININE 1.42* 1.36*  CALCIUM 8.4* 8.7*   PT/INR No results for input(s): LABPROT, INR in the last 72 hours.   Recent Labs Lab 12/07/15 0030  AST 22  ALT 15*  ALKPHOS 72  BILITOT 1.0  PROT 7.3  ALBUMIN 3.9     Lipase     Component Value Date/Time   LIPASE 19 12/07/2015 0030     Studies/Results: No results found.  Medications: . brimonidine  1 drop Both Eyes BID  . cloNIDine  0.1 mg Transdermal Weekly  . famotidine (PEPCID) IV  20 mg Intravenous Q12H  . heparin  5,000 Units Subcutaneous  Q8H  . latanoprost  1 drop Both Eyes QHS  . polyvinyl alcohol  1 drop Both Eyes 6 X Daily    Assessment/Plan [name], with no prior abdominal surgical history. No prior history of [name]. S/p [name], 12/10/15, [name];  POD 3 Hypertension - Poor control - Medicine following on Clonidine patch Chronic kidney disease - improving, Medicine following FEN: NPO/IV fluids ID: No abx DVT: Heparin/SCD    Plan: Clamping trials and sips of clears along with ice chips. Increase mobilization. Flutter valve   LOS: 6 days    John [name] 12/13/2015 [phone]

## 2015-12-13 NOTE — Progress Notes (Signed)
PROGRESS NOTE    John [name]  [id number] DOB: October 01, 1940 DOA: 12/07/2015 PCP: Noralee Space, MD   Brief Narrative: John [name] is an 75 y.o. male who was admitted to the surgical service today for a diagnosis of small bowel obstruction, patient has been placed on nothing by mouth, hydromorphone IV for pain control. Over the course of the day his blood pressure has become uncontrolled with systolic between Q000111Q and [id number]. Reports having abdominal pain, 8 out of 10 in intensity, dull in nature, no radiation, improved with pain medication, no worsening factors, no associated nausea or vomiting. Patient took his antihypertensive agents yesterday, losartan. His blood pressure has been uncontrolled as an outpatient.    Assessment & Plan:   Active Problems:   Essential hypertension   Sickle cell trait (HCC)   [name] (small bowel obstruction)  1-HTN, uncontrolled. Unable to use oral home regimen, patient is not tolerating oral medications.  Continue with PRN hydralazine.  Started on  clonidine patch.  IV metoprolol PRN  Start oral medications when tolerating oral. Would use oral hydralazine  2-[name]; per surgery. NG tube in place.  SBP protocol.  IV Pepcid, GI prophylaxis.  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 He denies history of chest pain on exertion. Echo with normal Ef 65 %. No wall motion abnormalities. Patient is at moderate risk for procedure due to age, HTN, CKD.  Underwent exploratory laparotomy 10-18, with lysis of adhesions.  Continue with IV fluids.  Awaiting for bowel function to recover.   3-hiccup; thorazine PRN   4-Chronic kidney disease stage 3;  Continue with IV fluids.  Cr last year 1.4--1.6 Follow trend, cr trending down.   5-Thrombocytopenia; follow trend. Stable.    6-fever; spike fever the night of 10-27. Started IV zosyn.  Chest x ray negative, will discontinue antibiotics and monitor.  Afebrile.    7-Cough; check chest x ray. incentive spirometry.    Thank you for this consultation.  Our [facility] hospitalist team will follow the patient with you.  DVT prophylaxis: heparin  Code Status: full code.  Family Communication: care discussed with patient.  Disposition Plan: remain inpatient     Procedures:   none   Antimicrobials:  none  Subjective: No significant abdominal pain. Pass flatus last night.  Report cough.    Objective: Vitals:   12/12/15 0632 12/12/15 1749 12/12/15 2050 12/13/15 0336  BP: (!) 159/82 (!) 165/80 (!) 180/79 (!) 196/99  Pulse: 82 76 74 66  Resp:  17 19 19   Temp:  98.6 F (37 C) 98.3 F (36.8 C) 98.5 F (36.9 C)  TempSrc:  Oral Oral Oral  SpO2:  100% 100% 100%  Weight:      Height:        Intake/Output Summary (Last 24 hours) at 12/13/15 1000 Last data filed at 12/13/15 0600  Gross per 24 hour  Intake          1486.25 ml  Output              880 ml  Net           606.25 ml   Filed Weights   12/07/15 0021  Weight: 99.8 kg (220 lb)    Examination:  General exam: Appears calm and comfortable. NG tube in place.  Respiratory system: Clear to auscultation. Respiratory effort normal. Cardiovascular system: S1 & S2 heard, RRR. No JVD, murmurs, rubs, gallops or clicks. No pedal edema. Gastrointestinal system:  Abdomen is distended, soft and mild tender. No organomegaly or masses felt. Clean dressing mid abdomen.  Central nervous system: Alert and oriented. No focal neurological deficits. Extremities: Symmetric 5 x 5 power. Skin: No rashes, lesions or ulcers Psychiatry: Judgement and insight appear normal. Mood & affect appropriate.     Data Reviewed: I have personally reviewed following labs and imaging studies  CBC:  Recent Labs Lab 12/08/15 0409 12/09/15 0601 12/10/15 0805 12/11/15 0351 12/12/15 0248  WBC 7.8 5.1 6.3 6.7 6.9  HGB 14.7 15.9 13.6 13.6 13.2  HCT 41.7 45.5 39.6 38.2* 37.9*  MCV 76.2* 76.5* 76.2* 75.2* 77.2*   PLT 125* 133* 121* 123* 0000000*   Basic Metabolic Panel:  Recent Labs Lab 12/08/15 0409 12/09/15 0601 12/10/15 0805 12/11/15 0351 12/12/15 0248  NA 140 142 143 142 144  K 3.7 3.7 3.3* 3.5 3.5  CL 105 104 110 111 114*  CO2 27 27 25 24 24   GLUCOSE 119* 128* 102* 120* 97  BUN 21* 22* 25* 23* 25*  CREATININE 1.46* 1.59* 1.70* 1.42* 1.36*  CALCIUM 8.6* 8.9 8.8* 8.4* 8.7*   GFR: Estimated Creatinine Clearance: 58.7 mL/min (by C-G formula based on SCr of 1.36 mg/dL (H)). Liver Function Tests:  Recent Labs Lab 12/07/15 0030  AST 22  ALT 15*  ALKPHOS 72  BILITOT 1.0  PROT 7.3  ALBUMIN 3.9    Recent Labs Lab 12/07/15 0030  LIPASE 19   No results for input(s): AMMONIA in the last 168 hours. Coagulation Profile: No results for input(s): INR, PROTIME in the last 168 hours. Cardiac Enzymes: No results for input(s): CKTOTAL, CKMB, CKMBINDEX, TROPONINI in the last 168 hours. BNP (last 3 results) No results for input(s): PROBNP in the last 8760 hours. HbA1C: No results for input(s): HGBA1C in the last 72 hours. CBG:  Recent Labs Lab 12/09/15 1104  GLUCAP 125*   Lipid Profile: No results for input(s): CHOL, HDL, LDLCALC, TRIG, CHOLHDL, LDLDIRECT in the last 72 hours. Thyroid Function Tests: No results for input(s): TSH, T4TOTAL, FREET4, T3FREE, THYROIDAB in the last 72 hours. Anemia Panel: No results for input(s): VITAMINB12, FOLATE, FERRITIN, TIBC, IRON, RETICCTPCT in the last 72 hours. Sepsis Labs: No results for input(s): PROCALCITON, LATICACIDVEN in the last 168 hours.  Recent Results (from the past 240 hour(s))  MRSA PCR Screening     Status: None   Collection Time: 12/10/15  8:04 AM  Result Value Ref Range Status   MRSA by PCR NEGATIVE NEGATIVE Final    Comment:        The GeneXpert MRSA Assay (FDA approved for NASAL specimens only), is one component of a comprehensive MRSA colonization surveillance program. It is not intended to diagnose MRSA infection  nor to guide or monitor treatment for MRSA infections.          Radiology Studies: No results found.      Scheduled Meds: . brimonidine  1 drop Both Eyes BID  . cloNIDine  0.1 mg Transdermal Weekly  . famotidine (PEPCID) IV  20 mg Intravenous Q12H  . heparin  5,000 Units Subcutaneous Q8H  . latanoprost  1 drop Both Eyes QHS  . polyvinyl alcohol  1 drop Both Eyes 6 X Daily   Continuous Infusions: . sodium chloride 75 mL/hr at 12/13/15 0600     LOS: 6 days    Time spent: 25  minutes   Elmarie Shiley, MD [facility] Pager [phone]  If 7PM-7AM, please contact night-coverage [email or web address] Password [email or web address] 12/13/2015,  10:00 AM

## 2015-12-14 DIAGNOSIS — I10 Essential (primary) hypertension: Secondary | ICD-10-CM

## 2015-12-14 DIAGNOSIS — K56609 Unspecified intestinal obstruction, unspecified as to partial versus complete obstruction: Secondary | ICD-10-CM

## 2015-12-14 DIAGNOSIS — E876 Hypokalemia: Secondary | ICD-10-CM

## 2015-12-14 DIAGNOSIS — N183 Chronic kidney disease, stage 3 (moderate): Secondary | ICD-10-CM

## 2015-12-14 DIAGNOSIS — N179 Acute kidney failure, unspecified: Secondary | ICD-10-CM

## 2015-12-14 LAB — BASIC METABOLIC PANEL
ANION GAP: 7 (ref 5–15)
BUN: 17 mg/dL (ref 6–20)
CHLORIDE: 111 mmol/L (ref 101–111)
CO2: 24 mmol/L (ref 22–32)
Calcium: 8.6 mg/dL — ABNORMAL LOW (ref 8.9–10.3)
Creatinine, Ser: 1.29 mg/dL — ABNORMAL HIGH (ref 0.61–1.24)
GFR, EST NON AFRICAN AMERICAN: 54 mL/min — AB (ref >60)
Glucose, Bld: 112 mg/dL — ABNORMAL HIGH (ref 65–99)
POTASSIUM: 3.3 mmol/L — AB (ref 3.5–5.1)
SODIUM: 142 mmol/L (ref 135–145)

## 2015-12-14 LAB — CBC
HCT: 38.5 % — ABNORMAL LOW (ref 39.0–52.0)
HEMOGLOBIN: 13.4 g/dL (ref 13.0–17.0)
MCH: 26.2 pg (ref 26.0–34.0)
MCHC: 34.8 g/dL (ref 30.0–36.0)
MCV: 75.2 fL — ABNORMAL LOW (ref 78.0–100.0)
PLATELETS: 141 10*3/uL — AB (ref 150–400)
RBC: 5.12 MIL/uL (ref 4.22–5.81)
RDW: 14.2 % (ref 11.5–15.5)
WBC: 9.1 10*3/uL (ref 4.0–10.5)

## 2015-12-14 MED ORDER — HYDRALAZINE HCL 20 MG/ML IJ SOLN
10.0000 mg | INTRAMUSCULAR | Status: DC | PRN
  Administered 2015-12-14: 10 mg via INTRAVENOUS
  Filled 2015-12-14: qty 1

## 2015-12-14 MED ORDER — POTASSIUM CHLORIDE CRYS ER 20 MEQ PO TBCR
40.0000 meq | EXTENDED_RELEASE_TABLET | ORAL | Status: AC
  Administered 2015-12-14 (×2): 40 meq via ORAL
  Filled 2015-12-14 (×2): qty 2

## 2015-12-14 MED ORDER — METOPROLOL TARTRATE 5 MG/5ML IV SOLN
2.5000 mg | Freq: Three times a day (TID) | INTRAVENOUS | Status: DC
  Administered 2015-12-14: 2.5 mg via INTRAVENOUS
  Filled 2015-12-14: qty 5

## 2015-12-14 MED ORDER — METOPROLOL TARTRATE 5 MG/5ML IV SOLN
5.0000 mg | Freq: Three times a day (TID) | INTRAVENOUS | Status: DC
  Administered 2015-12-14 – 2015-12-15 (×3): 5 mg via INTRAVENOUS
  Filled 2015-12-14 (×3): qty 5

## 2015-12-14 NOTE — Progress Notes (Signed)
Note:   CXR done yesterday was misread, discussed with Radiology and they will correct report.

## 2015-12-14 NOTE — Progress Notes (Signed)
4 Days Post-Op  Subjective: He is tired and feel bad still, having flatus and a smear of stool, Nothing from the NG, walking in halls.  Objective: Vital signs in last 24 hours: Temp:  [98.2 F (36.8 C)-98.5 F (36.9 C)] 98.4 F (36.9 C) (11/01 0448) Pulse Rate:  [98-107] 98 (11/01 0448) Resp:  [17-19] 17 (11/01 0448) BP: (145-172)/(85-105) 148/85 (11/01 0600) SpO2:  [98 %-99 %] 98 % (11/01 0448) Last BM Date: 12/13/15 (per patient and wife) NPO IV 2000 Urine 725 recorded Afebrile, VSS K+ 3.3 Creatinine is improving; CBC is stable No film  Intake/Output from previous day: 10/31 0701 - 11/01 0700 In: 1971.3 [I.V.:1871.3; IV Piggyback:100] Out: 775 [Urine:725; Emesis/NG output:50] Intake/Output this shift: No intake/output data recorded.  General appearance: alert, cooperative and no distress Resp: clear to auscultation bilaterally GI: soft, sore, few BS, flatus and smear of stool reported. incision healing nicely.  Lab Results:   Recent Labs  12/12/15 0248 12/14/15 0451  WBC 6.9 9.1  HGB 13.2 13.4  HCT 37.9* 38.5*  PLT 125* 141*    BMET  Recent Labs  12/13/15 0859 12/14/15 0451  NA 142 142  K 3.3* 3.3*  CL 111 111  CO2 22 24  GLUCOSE 104* 112*  BUN 22* 17  CREATININE 1.34* 1.29*  CALCIUM 8.9 8.6*   PT/INR No results for input(s): LABPROT, INR in the last 72 hours.   Recent Labs Lab 12/13/15 0859  AST 35  ALT 24  ALKPHOS 64  BILITOT 1.5*  PROT 6.5  ALBUMIN 3.1*     Lipase     Component Value Date/Time   LIPASE 19 12/07/2015 0030     Studies/Results: Dg Chest 2 View  Result Date: 12/13/2015 CLINICAL DATA:  Cough, congestion EXAM: CHEST  2 VIEW COMPARISON:  12/10/2015 FINDINGS: Interval placement of a right-sided chest tube without a pneumothorax. There is a nasogastric tube coursing below the diaphragm. There is no focal parenchymal opacity. There is no pleural effusion or pneumothorax. The heart and mediastinal contours are  unremarkable. The osseous structures are unremarkable. IMPRESSION: Interval placement of a right-sided chest tube without a pneumothorax. Electronically Signed   By: Kathreen Devoid   On: 12/13/2015 12:18    Medications: . brimonidine  1 drop Both Eyes BID  . cloNIDine  0.1 mg Transdermal Weekly  . famotidine (PEPCID) IV  20 mg Intravenous Q12H  . heparin  5,000 Units Subcutaneous Q8H  . latanoprost  1 drop Both Eyes QHS  . metoprolol  2.5 mg Intravenous Q8H  . polyvinyl alcohol  1 drop Both Eyes 6 X Daily   . sodium chloride 75 mL/hr at 12/13/15 2032    Assessment/Plan [name], with no prior abdominal surgical history. No prior history of [name]. S/p [name], 12/10/15, [name]; POD 4 Hypertension - Poor control - Medicine following on Clonidine patch Chronic kidney disease - improving, Medicine following FEN: NPO/IV fluids ID: No abx DVT: Heparin/SCD   Plan: DC NG and start on clears liquids. Continue to mobilize.    LOS: 7 days    Tyreon Frigon 12/14/2015 [phone]

## 2015-12-14 NOTE — Progress Notes (Signed)
PROGRESS NOTE    John [name]  [id number] DOB: 05/04/1940 DOA: 12/07/2015 PCP: Noralee Space, MD   Brief Narrative: John [name] is an 75 y.o. male who was admitted to the surgical service today for a diagnosis of small bowel obstruction, patient has been placed on nothing by mouth, hydromorphone IV for pain control. Over the course of the day his blood pressure has become uncontrolled with systolic between Q000111Q and A999333. Reports having abdominal pain, 8 out of 10 in intensity, dull in nature, no radiation, improved with pain medication, no worsening factors, no associated nausea or vomiting. Patient took his antihypertensive agents yesterday, losartan. His blood pressure has been uncontrolled as an outpatient.  Assessment & Plan:   Active Problems:   Essential hypertension   Sickle cell trait (HCC)   [name] (small bowel obstruction)  1-HTN, uncontrolled. Unable to use oral home regimen, as patient is not tolerating oral medications yet.  -Continue with PRN hydralazine.  -will continue on  clonidine patch.  -will schedule IV metoprolol for better control -Start oral medications when tolerating PO's.  -might need adjustment on his regimen depending on further improvement of renal function  -Echo with normal Ef 65 %. No wall motion abnormalities.  2-[name]; per surgery. NG tube removed today and diet advance to CLD.  -continue IV Pepcid for GI prophylaxis; once tolerating PO's can switch to by mouth.  -Underwent exploratory laparotomy 10-18, with lysis of adhesions.  -awaiting bowel function to recover now -slowly improving -will follow primary service instructions and decisions on this topic.  3-hiccup; continue thorazine PRN   4-AKI on Chronic kidney disease stage 3; -appears to be secondary to dehydration and continue use of nephrotoxic agents (lasix and cozaar). -Continue with IV fluids, but will adjust rate as currently initiated on CLD -Cr last year 1.4--1.6 -will Follow  trend -cr trending down and essentially at baseline 1.29 (11/01).   5-Thrombocytopenia;  -will follow trend.  -Stable.    6-fever; spike fever the night of 10-27.  -Started IV zosyn.  -Chest x ray negative, and no source of infection appreciated -will continue monitoring off abx's -patient has remained Afebrile.   7-Cough; -continue IS -NG removed now and patient reporting cough is not longer a big issue -will monitor  -CXR done on 12/13/15 didn't demonstrate acute cardiopulmonary process    Thank you for this consultation.  Our [facility] hospitalist team will follow the patient with you.  DVT prophylaxis: heparin  Code Status: full code.  Family Communication: care discussed with patient and wife at bedside..  Disposition Plan: remain inpatient, slowly advancing diet. Final decisions and recommendations as per primary service. Will adjust metoprolol and continue clonidine. Diet initiated today if tolerated will be able to resume home antihypertensive regimen soon. Discussed with surgery PA   Subjective: No significant abdominal pain. Passing gas and in no distress. BS appreciated on exam and NG removed as per surgery recommendations.   Objective: Vitals:   12/13/15 2223 12/14/15 0448 12/14/15 0600 12/14/15 1223  BP: (!) 145/85 (!) 168/103 (!) 148/85 (!) 170/98  Pulse:  98  80  Resp:  17  18  Temp:  98.4 F (36.9 C)  97.6 F (36.4 C)  TempSrc:  Oral  Oral  SpO2:  98%  99%  Weight:      Height:        Intake/Output Summary (Last 24 hours) at 12/14/15 1725 Last data filed at 12/14/15 1500  Gross per 24 hour  Intake  2625 ml  Output              825 ml  Net             1800 ml   Filed Weights   12/07/15 0021  Weight: 99.8 kg (220 lb)    Examination:  General exam: Appears calm and comfortable. NG tube discontinued today and patient reports feeling a lot better. Diet initiated today (CLD). Respiratory system: Clear to auscultation. Respiratory effort  normal. Cardiovascular system: S1 & S2 heard, RRR. No JVD, murmurs, rubs, gallops or clicks. No pedal edema. Gastrointestinal system: Abdomen is mildly distended, soft and without guaridng. No organomegaly or masses felt. Clean dressing mid abdomen. Positive BS Central nervous system: Alert and oriented. No focal neurological deficits. Extremities: Symmetric 5 x 5 power. No edema or cyanosis  Psychiatry: Judgement and insight appear normal. Mood & affect appropriate.    Data Reviewed: I have personally reviewed following labs and imaging studies  CBC:  Recent Labs Lab 12/09/15 0601 12/10/15 0805 12/11/15 0351 12/12/15 0248 12/14/15 0451  WBC 5.1 6.3 6.7 6.9 9.1  HGB 15.9 13.6 13.6 13.2 13.4  HCT 45.5 39.6 38.2* 37.9* 38.5*  MCV 76.5* 76.2* 75.2* 77.2* 75.2*  PLT 133* 121* 123* 125* Q000111Q*   Basic Metabolic Panel:  Recent Labs Lab 12/10/15 0805 12/11/15 0351 12/12/15 0248 12/13/15 0859 12/14/15 0451  NA 143 142 144 142 142  K 3.3* 3.5 3.5 3.3* 3.3*  CL 110 111 114* 111 111  CO2 25 24 24 22 24   GLUCOSE 102* 120* 97 104* 112*  BUN 25* 23* 25* 22* 17  CREATININE 1.70* 1.42* 1.36* 1.34* 1.29*  CALCIUM 8.8* 8.4* 8.7* 8.9 8.6*   GFR: Estimated Creatinine Clearance: 61.9 mL/min (by C-G formula based on SCr of 1.29 mg/dL (H)).   Liver Function Tests:  Recent Labs Lab 12/13/15 0859  AST 35  ALT 24  ALKPHOS 64  BILITOT 1.5*  PROT 6.5  ALBUMIN 3.1*   CBG:  Recent Labs Lab 12/09/15 1104  GLUCAP 125*    Recent Results (from the past 240 hour(s))  MRSA PCR Screening     Status: None   Collection Time: 12/10/15  8:04 AM  Result Value Ref Range Status   MRSA by PCR NEGATIVE NEGATIVE Final    Comment:        The GeneXpert MRSA Assay (FDA approved for NASAL specimens only), is one component of a comprehensive MRSA colonization surveillance program. It is not intended to diagnose MRSA infection nor to guide or monitor treatment for MRSA infections.       Radiology Studies: Dg Chest 2 View  Addendum Date: 12/14/2015   ADDENDUM REPORT: 12/14/2015 10:05 ADDENDUM: The report mentions a right-sided chest tube which in fact is the extracorporal portion of a capped nasogastric tube which happens to project over the right thorax. This was confirmed with the patient's physician. Electronically Signed   By: Kathreen Devoid   On: 12/14/2015 10:05   Result Date: 12/14/2015 CLINICAL DATA:  Cough, congestion EXAM: CHEST  2 VIEW COMPARISON:  12/10/2015 FINDINGS: Interval placement of a right-sided chest tube without a pneumothorax. There is a nasogastric tube coursing below the diaphragm. There is no focal parenchymal opacity. There is no pleural effusion or pneumothorax. The heart and mediastinal contours are unremarkable. The osseous structures are unremarkable. IMPRESSION: Interval placement of a right-sided chest tube without a pneumothorax. Electronically Signed: By: Kathreen Devoid On: 12/13/2015 12:18    Scheduled Meds: .  brimonidine  1 drop Both Eyes BID  . cloNIDine  0.1 mg Transdermal Weekly  . famotidine (PEPCID) IV  20 mg Intravenous Q12H  . heparin  5,000 Units Subcutaneous Q8H  . latanoprost  1 drop Both Eyes QHS  . metoprolol  5 mg Intravenous Q8H  . polyvinyl alcohol  1 drop Both Eyes 6 X Daily  . potassium chloride  40 mEq Oral Q4H   Continuous Infusions: . sodium chloride 75 mL/hr at 12/14/15 1203     LOS: 7 days    Time spent: 25  minutes   Barton Dubois, MD [facility] Pager [phone]  If 7PM-7AM, please contact night-coverage [email or web address] Password [email or web address] 12/14/2015, 5:25 PM

## 2015-12-14 NOTE — Progress Notes (Signed)
PT Cancellation Note  Patient Details Name: John [name] MRN: [id number] DOB: October 01, 1940   Cancelled Treatment:    Reason Eval/Treat Not Completed: Patient declined, no reason specifiedPt declined due to fatigue and feeling a little nauseated after lunch. PT will continue to follow acutely.    Salina April, PTA Pager: [phone]   12/14/2015, 3:02 PM

## 2015-12-15 LAB — BASIC METABOLIC PANEL
Anion gap: 6 (ref 5–15)
BUN: 17 mg/dL (ref 6–20)
CHLORIDE: 112 mmol/L — AB (ref 101–111)
CO2: 24 mmol/L (ref 22–32)
CREATININE: 1.29 mg/dL — AB (ref 0.61–1.24)
Calcium: 8.6 mg/dL — ABNORMAL LOW (ref 8.9–10.3)
GFR calc Af Amer: >60 mL/min (ref >60)
GFR, EST NON AFRICAN AMERICAN: 54 mL/min — AB (ref >60)
GLUCOSE: 106 mg/dL — AB (ref 65–99)
Potassium: 3.5 mmol/L (ref 3.5–5.1)
SODIUM: 142 mmol/L (ref 135–145)

## 2015-12-15 MED ORDER — LOSARTAN POTASSIUM 50 MG PO TABS
100.0000 mg | ORAL_TABLET | Freq: Every day | ORAL | Status: DC
  Administered 2015-12-15 – 2015-12-16 (×2): 100 mg via ORAL
  Filled 2015-12-15 (×2): qty 2

## 2015-12-15 MED ORDER — OXYCODONE-ACETAMINOPHEN 5-325 MG PO TABS
1.0000 | ORAL_TABLET | ORAL | Status: DC | PRN
  Administered 2015-12-15: 1 via ORAL
  Filled 2015-12-15: qty 1

## 2015-12-15 MED ORDER — CARVEDILOL 12.5 MG PO TABS
12.5000 mg | ORAL_TABLET | Freq: Two times a day (BID) | ORAL | Status: DC
  Administered 2015-12-15 – 2015-12-16 (×2): 12.5 mg via ORAL
  Filled 2015-12-15 (×2): qty 1

## 2015-12-15 MED ORDER — FAMOTIDINE 20 MG PO TABS
20.0000 mg | ORAL_TABLET | Freq: Two times a day (BID) | ORAL | Status: DC
  Administered 2015-12-15 – 2015-12-16 (×3): 20 mg via ORAL
  Filled 2015-12-15 (×3): qty 1

## 2015-12-15 MED ORDER — DIPHENHYDRAMINE HCL 25 MG PO CAPS: 25.0000 mg | ORAL_CAPSULE | Freq: Four times a day (QID) | ORAL | Status: DC | PRN

## 2015-12-15 NOTE — Progress Notes (Signed)
PROGRESS NOTE    ANTIONO NOLAND  [id number] DOB: 08/01/1940 DOA: 12/07/2015 PCP: Noralee Space, MD   Brief Narrative: John [name] is an 75 y.o. male who was admitted to the surgical service today for a diagnosis of small bowel obstruction, patient has been placed on nothing by mouth, hydromorphone IV for pain control. Over the course of the day his blood pressure has become uncontrolled with systolic between Q000111Q and A999333. Reports having abdominal pain, 8 out of 10 in intensity, dull in nature, no radiation, improved with pain medication, no worsening factors, no associated nausea or vomiting. Patient took his antihypertensive agents yesterday, losartan. His blood pressure has been uncontrolled as an outpatient.  Assessment & Plan:   Active Problems:   Essential hypertension   Sickle cell trait (HCC)   [name] (small bowel obstruction)  1-HTN, uncontrolled. Unable to use oral home regimen, as patient is not tolerating oral medications yet.  -Continue with PRN hydralazine.  -will transition to coreg and resume cozaar -follow renal function in am -adjust antihypertensive agents (now by mouth) as needed -might need adjustment on his regimen depending on renal function  -Echo with normal Ef 65 %. No wall motion abnormalities.  2-[name]; per surgery. NG tube removed today and diet advance to CLD.  -continue IV Pepcid for GI prophylaxis; once tolerating PO's can switch to by mouth.  -Underwent exploratory laparotomy 10-18, with lysis of adhesions.  -awaiting bowel function to recover now -slowly improving -will follow primary service instructions and decisions on this topic.  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 -Continue with IV fluids, but will adjust rate as currently tolerating diet better. -Cr last year 1.4--1.6 -will Follow trend -cr  trending down and essentially back to baseline 1.29 (11/01).   5-Thrombocytopenia;  -will follow trend.  -Stable.    6-fever; spike fever the night of 10-27.  -Started IV zosyn.  -Chest x ray negative, and no source of infection appreciated -will continue monitoring off abx's -patient has remained Afebrile.   7-Cough; -continue IS -NG removed now and patient reporting cough is not longer a big issue -will monitor  -CXR done on 12/13/15 didn't demonstrate acute cardiopulmonary process    Thank you for this consultation.  Our [facility] hospitalist team will follow the patient with you.  DVT prophylaxis: heparin  Code Status: full code.  Family Communication: care discussed with patient and wife at bedside..  Disposition Plan: remain inpatient, slowly advancing diet. Final decisions and recommendations as per primary service. Diet further advance today; will transition antihypertensive drugs to pills. Discussed with surgery PA   Subjective: No significant abdominal pain. Passing gas and tolerating CLD. Patient denies CP and SOB  Objective: Vitals:   12/14/15 2017 12/14/15 2146 12/15/15 0525 12/15/15 1440  BP: (!) 169/100 (!) 153/81 121/73 114/62  Pulse: 87 94 85 80  Resp: 18 16 18 18   Temp: 97.8 F (36.6 C)  98.6 F (37 C) 98.2 F (36.8 C)  TempSrc: Oral  Oral Oral  SpO2: 99%  98% 100%  Weight:      Height:        Intake/Output Summary (Last 24 hours) at 12/15/15 1527 Last data filed at 12/15/15 1442  Gross per 24 hour  Intake          1632.92 ml  Output  0 ml  Net          1632.92 ml   Filed Weights   12/07/15 0021  Weight: 99.8 kg (220 lb)    Examination:  General exam: Appears calm and comfortable. NG tube discontinued on 12/14/15 and since then tolerating CLD w/o problems. Denies CP and SOB. Respiratory system: Clear to auscultation. Respiratory effort normal. Cardiovascular system: S1 & S2 heard, RRR. No JVD, murmurs, rubs, gallops or clicks. No  pedal edema. Gastrointestinal system: Abdomen is mildly distended, soft and without guaridng. No organomegaly or masses felt. Clean dressing mid abdomen. Positive BS Central nervous system: Alert and oriented. No focal neurological deficits. Extremities: Symmetric 5 x 5 power. No edema or cyanosis  Psychiatry: Judgement and insight appear normal. Mood & affect appropriate.    Data Reviewed: I have personally reviewed following labs and imaging studies  CBC:  Recent Labs Lab 12/09/15 0601 12/10/15 0805 12/11/15 0351 12/12/15 0248 12/14/15 0451  WBC 5.1 6.3 6.7 6.9 9.1  HGB 15.9 13.6 13.6 13.2 13.4  HCT 45.5 39.6 38.2* 37.9* 38.5*  MCV 76.5* 76.2* 75.2* 77.2* 75.2*  PLT 133* 121* 123* 125* Q000111Q*   Basic Metabolic Panel:  Recent Labs Lab 12/11/15 0351 12/12/15 0248 12/13/15 0859 12/14/15 0451 12/15/15 0536  NA 142 144 142 142 142  K 3.5 3.5 3.3* 3.3* 3.5  CL 111 114* 111 111 112*  CO2 24 24 22 24 24   GLUCOSE 120* 97 104* 112* 106*  BUN 23* 25* 22* 17 17  CREATININE 1.42* 1.36* 1.34* 1.29* 1.29*  CALCIUM 8.4* 8.7* 8.9 8.6* 8.6*   GFR: Estimated Creatinine Clearance: 61.9 mL/min (by C-G formula based on SCr of 1.29 mg/dL (H)).   Liver Function Tests:  Recent Labs Lab 12/13/15 0859  AST 35  ALT 24  ALKPHOS 64  BILITOT 1.5*  PROT 6.5  ALBUMIN 3.1*   CBG:  Recent Labs Lab 12/09/15 1104  GLUCAP 125*    Recent Results (from the past 240 hour(s))  MRSA PCR Screening     Status: None   Collection Time: 12/10/15  8:04 AM  Result Value Ref Range Status   MRSA by PCR NEGATIVE NEGATIVE Final    Comment:        The GeneXpert MRSA Assay (FDA approved for NASAL specimens only), is one component of a comprehensive MRSA colonization surveillance program. It is not intended to diagnose MRSA infection nor to guide or monitor treatment for MRSA infections.      Radiology Studies: No results found.  Scheduled Meds: . brimonidine  1 drop Both Eyes BID  .  carvedilol  12.5 mg Oral BID WC  . famotidine  20 mg Oral BID  . heparin  5,000 Units Subcutaneous Q8H  . latanoprost  1 drop Both Eyes QHS  . losartan  100 mg Oral Daily  . polyvinyl alcohol  1 drop Both Eyes 6 X Daily   Continuous Infusions: . sodium chloride 50 mL/hr at 12/15/15 0524     LOS: 8 days    Time spent: 25  minutes   Barton Dubois, MD [facility] Pager [phone]  If 7PM-7AM, please contact night-coverage [email or web address] Password [facility] 12/15/2015, 3:27 PM

## 2015-12-15 NOTE — Progress Notes (Signed)
5 Days Post-Op  Subjective: He is coughing up allot of sputum, it is white colored.  Says he did OK with Clears and wants to go to full liquids.  His wife wants him to go home tomorrow.  He walked halls 3 times yesterday.  He is having some stool.    Objective: Vital signs in last 24 hours: Temp:  [97.6 F (36.4 C)-98.6 F (37 C)] 98.6 F (37 C) (11/02 0525) Pulse Rate:  [80-94] 85 (11/02 0525) Resp:  [16-18] 18 (11/02 0525) BP: (121-170)/(73-100) 121/73 (11/02 0525) SpO2:  [98 %-99 %] 98 % (11/02 0525) Last BM Date: 12/14/15 Afebrile, VSS, BP really good last PM Creatinine is 1.29,  360 PO recorded BM x 2 recorded Intake/Output from previous day: 11/01 0701 - 11/02 0700 In: 1886.7 [P.O.:360; I.V.:1426.7; IV Piggyback:100] Out: 100 [Urine:100] Intake/Output this shift: No intake/output data recorded.  General appearance: alert, cooperative and no distress Resp: clear to auscultation bilaterally GI: Soft, positive bowel sounds, tolerating clear liquids well. Midline incisions healing nicely.  Lab Results:   Recent Labs  12/14/15 0451  WBC 9.1  HGB 13.4  HCT 38.5*  PLT 141*    BMET  Recent Labs  12/14/15 0451 12/15/15 0536  NA 142 142  K 3.3* 3.5  CL 111 112*  CO2 24 24  GLUCOSE 112* 106*  BUN 17 17  CREATININE 1.29* 1.29*  CALCIUM 8.6* 8.6*   PT/INR No results for input(s): LABPROT, INR in the last 72 hours.   Recent Labs Lab 12/13/15 0859  AST 35  ALT 24  ALKPHOS 64  BILITOT 1.5*  PROT 6.5  ALBUMIN 3.1*     Lipase     Component Value Date/Time   LIPASE 19 12/07/2015 0030     Studies/Results: Dg Chest 2 View  Addendum Date: 12/14/2015   ADDENDUM REPORT: 12/14/2015 10:05 ADDENDUM: The report mentions a right-sided chest tube which in fact is the extracorporal portion of a capped nasogastric tube which happens to project over the right thorax. This was confirmed with the patient's physician. Electronically Signed   By: Kathreen Devoid   On:  12/14/2015 10:05   Result Date: 12/14/2015 CLINICAL DATA:  Cough, congestion EXAM: CHEST  2 VIEW COMPARISON:  12/10/2015 FINDINGS: Interval placement of a right-sided chest tube without a pneumothorax. There is a nasogastric tube coursing below the diaphragm. There is no focal parenchymal opacity. There is no pleural effusion or pneumothorax. The heart and mediastinal contours are unremarkable. The osseous structures are unremarkable. IMPRESSION: Interval placement of a right-sided chest tube without a pneumothorax. Electronically Signed: By: Kathreen Devoid On: 12/13/2015 12:18    Medications: . brimonidine  1 drop Both Eyes BID  . cloNIDine  0.1 mg Transdermal Weekly  . famotidine (PEPCID) IV  20 mg Intravenous Q12H  . heparin  5,000 Units Subcutaneous Q8H  . latanoprost  1 drop Both Eyes QHS  . metoprolol  5 mg Intravenous Q8H  . polyvinyl alcohol  1 drop Both Eyes 6 X Daily   . sodium chloride 50 mL/hr at 12/15/15 0524   Assessment/Plan [name], with no prior abdominal surgical history. No prior history of [name]. S/p [name], 12/10/15, [name]; POD 5 Hypertension - Poor control - Medicine following on Clonidine patch Chronic kidney disease - improving, Medicine following FEN: Clearliquids/IV fluids ID: No abx DVT: Heparin/SCD  Plan: He has I's and flutter valve both of which he is using. Chest x-ray yesterday showed no infiltrate  or effusion. I will advance his diet again today, if he does well we'll put him on a soft diet in the a.m. Dr. Dyann Kief will restart oral low-pressure medications. He makes good progress and hopefully home next 24-48 hours.        LOS: 8 days    Shery Wauneka 12/15/2015 [phone]

## 2015-12-15 NOTE — Care Management Note (Signed)
Case Management Note  Patient Details  Name: John [name] MRN: [id number] Date of Birth: [birth year]  Subjective/Objective:                    Action/Plan:   Expected Discharge Date:                  Expected Discharge Plan:  Home/Self Care  In-House Referral:     Discharge planning Services  CM Consult  Post Acute Care Choice:  Durable Medical Equipment Choice offered to:     DME Arranged:  3-N-1, Walker rolling DME Agency:     HH Arranged:    HH Agency:     Status of Service:  Completed, signed off  If discussed at [employer], dates discussed:    Additional Comments:  Marilu Favre, RN 12/15/2015, 3:20 PM

## 2015-12-15 NOTE — Progress Notes (Signed)
Physical Therapy Treatment Patient Details Name: John [name] MRN: [id number] DOB: [birth year] Today's Date: 12/15/2015    History of Present Illness Admitted 10/25 with abdominal distention and progressive pain, [name]; s/p exp lapwith lysis of adhesions 10/28;  has a past medical history of Anxiety; Glaucoma; Gynecomastia; Hemorrhoids; adenomatous colonic polyps; Hypercholesteremia; Hyperplasia of prostate with urinary obstruction; Hypertension; Premature ventricular contractions; Prostate cancerSleep apnea; and Urinary retention.    PT Comments    Pt was able to navigate stairs with 1 hand support on rail. Pt has met all written goals and has no further physical therapy needs. Pt is safe to discharge to home with intermittent supervision prn.   Follow Up Recommendations  No PT follow up;Supervision - Intermittent     Equipment Recommendations  Rolling walker with 5" wheels;3in1 (PT)    Recommendations for Other Services       Precautions / Restrictions Precautions Precautions: Fall Restrictions Weight Bearing Restrictions: No    Mobility  Bed Mobility                  Transfers Overall transfer level: Modified independent Equipment used: Rolling walker (2 wheeled) Transfers: Sit to/from Stand Sit to Stand: Modif I         General transfer comment: cues to touch knees to back of chair before sitting  Ambulation/Gait Ambulation/Gait assistance: Modif I Ambulation Distance (Feet): 600 Feet Assistive device: Rolling walker (2 wheeled) Gait Pattern/deviations: Decreased stride length         Stairs Stairs: Yes Stairs assistance: Modif I Stair Management: One rail Left Number of Stairs:  (flight) General stair comments: pt was able to navigate stairs in step-to pattern with modif I. Pt reports no excessive fatigue after ascending/descending full flight  Wheelchair Mobility    Modified Rankin (Stroke Patients Only)       Balance Overall balance  assessment: Modified Independent (pt able to maintain balance with supervision)                                  Cognition Arousal/Alertness: Awake/alert Behavior During Therapy: WFL for tasks assessed/performed Overall Cognitive Status: Within Functional Limits for tasks assessed                      Exercises      General Comments        Pertinent Vitals/Pain Pain Assessment: 0-10 Pain Score: 1  Pain Location: abdomen Pain Descriptors / Indicators: Aching Pain Intervention(s): Monitored during session    Home Living                      Prior Function            PT Goals (current goals can now be found in the care plan section) Progress towards PT goals: Goals met/education completed, patient discharged from PT    Frequency           PT Plan Current plan remains appropriate    Co-evaluation             End of Session Equipment Utilized During Treatment: Gait belt Activity Tolerance: Patient tolerated treatment well Patient left: in chair;with call bell/phone within reach;with family/visitor present     Time: 3202-3343 PT Time Calculation (min) (ACUTE ONLY): 22 min  Charges:  $Gait Training: 8-22 mins  G Codes:      Irwin Brakeman F 12/15/2015, 1:49 PM  Wells Guiles Kim,SPT  I have read, reviewed and agree with student's note.   [place] [phone] (pager)

## 2015-12-15 NOTE — Progress Notes (Signed)
Physical Therapy Discharge Patient Details Name: John [name] MRN: [id number] DOB: 1940-09-02 Today's Date: 12/15/2015 Time: 2230-0979 PT Time Calculation (min) (ACUTE ONLY): 22 min  Patient discharged from PT services secondary to goals met and no further PT needs identified.  Please see latest therapy progress note for current level of functioning and progress toward goals.    Progress and discharge plan discussed with patient and/or caregiver: Patient/Caregiver agrees with plan  GP     Tonia Brooms 12/15/2015, 1:27 PM

## 2015-12-15 NOTE — Discharge Instructions (Signed)
[facility]      [facility] [phone]  OPEN ABDOMINAL SURGERY: POST OP INSTRUCTIONS  Always review your discharge instruction sheet given to you by the facility where your surgery was performed.  IF YOU HAVE DISABILITY OR FAMILY LEAVE FORMS, YOU MUST BRING THEM TO THE OFFICE FOR PROCESSING.  PLEASE DO NOT GIVE THEM TO YOUR DOCTOR.  1. A prescription for pain medication may be given to you upon discharge.  Take your pain medication as prescribed, if needed.  If narcotic pain medicine is not needed, then you may take acetaminophen (Tylenol) or ibuprofen (Advil) as needed. 2. Take your usually prescribed medications unless otherwise directed. 3. If you need a refill on your pain medication, please contact your pharmacy. They will contact our office to request authorization.  Prescriptions will not be filled after 5pm or on week-ends. 4. You should follow a light diet the first few days after arrival home, such as soup and crackers, pudding, etc.unless your doctor has advised otherwise. A high-fiber, low fat diet can be resumed as tolerated.   Be sure to include lots of fluids daily. Most patients will experience some swelling and bruising on the chest and neck area.  Ice packs will help.  Swelling and bruising can take several days to resolve 5. Most patients will experience some swelling and bruising in the area of the incision. Ice pack will help. Swelling and bruising can take several days to resolve..  6. It is common to experience some constipation if taking pain medication after surgery.  Increasing fluid intake and taking a stool softener will usually help or prevent this problem from occurring.  A mild laxative (Milk of Magnesia or Miralax) should be taken according to package directions if there are no bowel movements after 48 hours. 7.  You may have steri-strips (small skin tapes) in place directly over the incision.  These strips should be left on the skin for 7-10 days.  If your  surgeon used skin glue on the incision, you may shower in 24 hours.  The glue will flake off over the next 2-3 weeks.  Any sutures or staples will be removed at the office during your follow-up visit. You may find that a light gauze bandage over your incision may keep your staples from being rubbed or pulled. You may shower and replace the bandage daily. 8. ACTIVITIES:  You may resume regular (light) daily activities beginning the next day--such as daily self-care, walking, climbing stairs--gradually increasing activities as tolerated.  You may have sexual intercourse when it is comfortable.  Refrain from any heavy lifting or straining until approved by your doctor. a. You may drive when you no longer are taking prescription pain medication, you can comfortably wear a seatbelt, and you can safely maneuver your car and apply brakes b. Return to Work: ___________________________________ 9. You should see your doctor in the office for a follow-up appointment approximately two weeks after your surgery.  Make sure that you call for this appointment within a day or two after you arrive home to insure a convenient appointment time. OTHER INSTRUCTIONS:  _____________________________________________________________ _____________________________________________________________  WHEN TO CALL YOUR DOCTOR: 1. Fever over 101.0 2. Inability to urinate 3. Nausea and/or vomiting 4. Extreme swelling or bruising 5. Continued bleeding from incision. 6. Increased pain, redness, or drainage from the incision. 7. Difficulty swallowing or breathing 8. Muscle cramping or spasms. 9. Numbness or tingling in hands or feet or around lips.  The clinic staff is available to   answer your questions during regular business hours.  Please don't hesitate to call and ask to speak to one of the nurses if you have concerns.  For further questions, please visit [email or web address]   

## 2015-12-16 LAB — BASIC METABOLIC PANEL
ANION GAP: 6 (ref 5–15)
BUN: 15 mg/dL (ref 6–20)
CALCIUM: 8.7 mg/dL — AB (ref 8.9–10.3)
CO2: 24 mmol/L (ref 22–32)
Chloride: 111 mmol/L (ref 101–111)
Creatinine, Ser: 1.46 mg/dL — ABNORMAL HIGH (ref 0.61–1.24)
GFR calc Af Amer: 54 mL/min — ABNORMAL LOW (ref >60)
GFR, EST NON AFRICAN AMERICAN: 46 mL/min — AB (ref >60)
GLUCOSE: 111 mg/dL — AB (ref 65–99)
Potassium: 3.3 mmol/L — ABNORMAL LOW (ref 3.5–5.1)
Sodium: 141 mmol/L (ref 135–145)

## 2015-12-16 LAB — URINALYSIS, ROUTINE W REFLEX MICROSCOPIC
Bilirubin Urine: NEGATIVE
Glucose, UA: NEGATIVE mg/dL
Ketones, ur: NEGATIVE mg/dL
Leukocytes, UA: NEGATIVE
Nitrite: NEGATIVE
Protein, ur: 30 mg/dL — AB
SPECIFIC GRAVITY, URINE: 1.011 (ref 1.005–1.030)
pH: 5.5 (ref 5.0–8.0)

## 2015-12-16 LAB — URINE MICROSCOPIC-ADD ON

## 2015-12-16 MED ORDER — CARVEDILOL 12.5 MG PO TABS: 12.5000 mg | ORAL_TABLET | Freq: Two times a day (BID) | ORAL | 0 refills | Status: DC

## 2015-12-16 MED ORDER — POTASSIUM CHLORIDE CRYS ER 20 MEQ PO TBCR
40.0000 meq | EXTENDED_RELEASE_TABLET | ORAL | Status: AC
Start: 2015-12-16 — End: 2015-12-16
  Administered 2015-12-16 (×2): 40 meq via ORAL
  Filled 2015-12-16 (×3): qty 2

## 2015-12-16 NOTE — Progress Notes (Signed)
6 Days Post-Op  Subjective: Complains of hematuria this AM that is new, he had some one other time and it was just after surgery and I attributed it to foley. He is voided a second time since he had the hematuria and this was clear. He has a history of prostate cancer with prior retrorectal prostate biopsy. He had his first soft diet this morning and did really well with that. He does not like the Percocet would like to use plain Tylenol for pain relief.  Objective: Vital signs in last 24 hours: Temp:  [97.5 F (36.4 C)-98.7 F (37.1 C)] 98.6 F (37 C) (11/03 0856) Pulse Rate:  [72-90] 90 (11/03 0856) Resp:  [17-20] 17 (11/03 0856) BP: (114-157)/(62-90) 126/66 (11/03 0639) SpO2:  [100 %] 100 % (11/03 0856) Last BM Date: 12/14/15 920 PO Voided x 4 Stool x 2 Afebrile, VSS  BP well controlled and heart rate is good K+ 3.3 Intake/Output from previous day: 11/02 0701 - 11/03 0700 In: 1320 [P.O.:920; I.V.:400] Out: -  Intake/Output this shift: Total I/O In: 240 [P.O.:240] Out: -   General appearance: alert, cooperative and no distress Resp: clear to auscultation bilaterally GI: Soft positive bowel sounds, tolerating soft diet. Positive bowel movements.Incision looks good.  Lab Results:   Recent Labs  12/14/15 0451  WBC 9.1  HGB 13.4  HCT 38.5*  PLT 141*    BMET  Recent Labs  12/15/15 0536 12/16/15 0333  NA 142 141  K 3.5 3.3*  CL 112* 111  CO2 24 24  GLUCOSE 106* 111*  BUN 17 15  CREATININE 1.29* 1.46*  CALCIUM 8.6* 8.7*   PT/INR No results for input(s): LABPROT, INR in the last 72 hours.   Recent Labs Lab 12/13/15 0859  AST 35  ALT 24  ALKPHOS 64  BILITOT 1.5*  PROT 6.5  ALBUMIN 3.1*     Lipase     Component Value Date/Time   LIPASE 19 12/07/2015 0030     Studies/Results: No results found.  Medications: . brimonidine  1 drop Both Eyes BID  . carvedilol  12.5 mg Oral BID WC  . famotidine  20 mg Oral BID  . heparin  5,000 Units  Subcutaneous Q8H  . latanoprost  1 drop Both Eyes QHS  . losartan  100 mg Oral Daily  . polyvinyl alcohol  1 drop Both Eyes 6 X Daily  . potassium chloride  40 mEq Oral Q4H   . sodium chloride 50 mL/hr at 12/16/15 0016    Assessment/Plan [name], with no prior abdominal surgical history. No prior history of [name]. S/p [name], 12/10/15, Dr. Georgette [name]; POD 6 Hypertension - Poor control - Medicine has adjusted his PO meds and BP well controlled Chronic kidney disease - improving, Medicine following Hematuria with hx of prostate CA - Dr. Dutch [name] - checking UA & culture FEN: soft diet ID: No abx DVT: Heparin/SCD  Plan:  I'm going to hold his heparin, check UA and urine culture. Continue regular diet. I have recommended he call Dr. Alinda [name] for follow-up, along with his PCP for follow-up of his blood pressure and renal issues. If he does well we may discharge later today.      LOS: 9 days    John [name] 12/16/2015 [phone]

## 2015-12-16 NOTE — Progress Notes (Addendum)
Pt. Reported blood in urine. [facility] PA Will Creig Hines made aware.

## 2015-12-16 NOTE — Progress Notes (Signed)
John [name] to be D/C'd  per MD order. Discussed with the patient and all questions fully answered.  VSS, Skin clean, dry and intact without evidence of skin break down, no evidence of skin tears noted.  IV catheter discontinued intact. Site without signs and symptoms of complications. Dressing and pressure applied.  An After Visit Summary was printed and given to the patient. Patient received prescription.  D/c education completed with patient/family including follow up instructions, medication list, d/c activities limitations if indicated, with other d/c instructions as indicated by MD - patient able to verbalize understanding, all questions fully answered.   Patient instructed to return to ED, call 911, or call MD for any changes in condition.   Patient to be escorted via [place], and D/C home via private auto.

## 2015-12-16 NOTE — Progress Notes (Signed)
PROGRESS NOTE    John [name]  [id number] DOB: 21-Sep-1940 DOA: 12/07/2015 PCP: Noralee Space, MD   Brief Narrative: John [name] is an 75 y.o. male who was admitted to the surgical service today for a diagnosis of small bowel obstruction, patient has been placed on nothing by mouth, hydromorphone IV for pain control. Over the course of the day his blood pressure has become uncontrolled with systolic between [id number] and [id number]. Reports having abdominal pain, 8 out of 10 in intensity, dull in nature, no radiation, improved with pain medication, no worsening factors, no associated nausea or vomiting. Patient took his antihypertensive agents yesterday, losartan. His blood pressure has been uncontrolled as an outpatient.  Assessment & Plan:   Active Problems:   Essential hypertension   Sickle cell trait (HCC)   [name] (small bowel obstruction)  1-HTN, uncontrolled.   -good BP control with the use of coreg and cozaar -will continue holding lasix until follow up with PCP and repeat BMET at that time to evaluate further renal function trend -advise to follow heart healthy/low sodium diet  -adjust antihypertensive agents (now by mouth) as needed -might need adjustment on his regimen depending on renal function  -Echo with normal Ef 65 %. No wall motion abnormalities.  2-[name]; per surgery. NG tube removed today and diet advance to CLD.  -continue IV Pepcid for GI prophylaxis; once tolerating PO's can switch to by mouth.  -Underwent exploratory laparotomy 10-18, with lysis of adhesions.  -awaiting bowel function to recover now -slowly improving -will follow primary service instructions and decisions on this topic.  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 -patient advised to maintain adequate hydration  -Cr last year 1.4--1.8 -will Follow trend -cr  trending down and essentially back to baseline 1.4 (11/03).  -will recommend BMET as an outpatient to follow trend   5-Thrombocytopenia;  -will follow trend.  -Stable.    6-fever; spike fever the night of 10-27.  -Started IV zosyn.  -Chest x ray negative, and no source of infection appreciated -will continue monitoring off abx's -patient has remained Afebrile.   7-Cough; -continue IS -NG removed now and patient reporting cough is not longer a big issue -will monitor  -CXR done on 12/13/15 didn't demonstrate acute cardiopulmonary process   8-hematuria -isolated episode -recommend to discontinue heparin products -UA/urine cx ordered by surgery -outpatient follow with urology   Thank you for this consultation.  Our [facility] hospitalist team will sign off at this point. Call with any other questions or concerns.   DVT prophylaxis: heparin  Code Status: full code.  Family Communication: care discussed with patient and wife at bedside..  Disposition Plan: remain inpatient, slowly advancing diet. Final decisions and recommendations as per primary service. Diet further advance today; will recommend continue coreg and cozaar at discharge. Discussed with surgery PA.    Subjective: No significant abdominal pain. Passing gas and tolerating full liquid/soft diet. Patient denies CP and SOB. Reports isolated event of hematuria this morning; denies dysuria.  Objective: Vitals:   12/16/15 0639 12/16/15 0845 12/16/15 0856 12/16/15 1336  BP: 126/66   (!) 149/82  Pulse: 76 90 90 64  Resp: 18  17 17   Temp: 98.7 F (37.1 C)  98.6 F (37 C) 98.2 F (36.8 C)  TempSrc: Oral  Oral Oral  SpO2: 100%  100% 97%  Weight:  Height:        Intake/Output Summary (Last 24 hours) at 12/16/15 1356 Last data filed at 12/16/15 1336  Gross per 24 hour  Intake             1320 ml  Output              325 ml  Net              995 ml   Filed Weights   12/07/15 0021  Weight: 99.8 kg (220 lb)     Examination:  General exam: Appears calm and comfortable. NG tube discontinued on 12/14/15 and since then tolerating full liquid diet and soft diet early today. Denies CP and SOB. Had one episode of hematuria this morning.  Respiratory system: Clear to auscultation. Respiratory effort normal. Cardiovascular system: S1 & S2 heard, RRR. No JVD, murmurs, rubs, gallops or clicks. No pedal edema. Gastrointestinal system: Abdomen is mildly distended, soft and without guaridng. No organomegaly or masses felt. Clean dressing mid abdomen. Positive BS Central nervous system: Alert and oriented. No focal neurological deficits. Extremities: Symmetric 5 x 5 power. No edema or cyanosis  Psychiatry: Judgement and insight appear normal. Mood & affect appropriate.    Data Reviewed: I have personally reviewed following labs and imaging studies  CBC:  Recent Labs Lab 12/10/15 0805 12/11/15 0351 12/12/15 0248 12/14/15 0451  WBC 6.3 6.7 6.9 9.1  HGB 13.6 13.6 13.2 13.4  HCT 39.6 38.2* 37.9* 38.5*  MCV 76.2* 75.2* 77.2* 75.2*  PLT 121* 123* 125* Q000111Q*   Basic Metabolic Panel:  Recent Labs Lab 12/12/15 0248 12/13/15 0859 12/14/15 0451 12/15/15 0536 12/16/15 0333  NA 144 142 142 142 141  K 3.5 3.3* 3.3* 3.5 3.3*  CL 114* 111 111 112* 111  CO2 24 22 24 24 24   GLUCOSE 97 104* 112* 106* 111*  BUN 25* 22* 17 17 15   CREATININE 1.36* 1.34* 1.29* 1.29* 1.46*  CALCIUM 8.7* 8.9 8.6* 8.6* 8.7*   GFR: Estimated Creatinine Clearance: 54.7 mL/min (by C-G formula based on SCr of 1.46 mg/dL (H)).   Liver Function Tests:  Recent Labs Lab 12/13/15 0859  AST 35  ALT 24  ALKPHOS 64  BILITOT 1.5*  PROT 6.5  ALBUMIN 3.1*   CBG: No results for input(s): GLUCAP in the last 168 hours.  Recent Results (from the past 240 hour(s))  MRSA PCR Screening     Status: None   Collection Time: 12/10/15  8:04 AM  Result Value Ref Range Status   MRSA by PCR NEGATIVE NEGATIVE Final    Comment:        The  GeneXpert MRSA Assay (FDA approved for NASAL specimens only), is one component of a comprehensive MRSA colonization surveillance program. It is not intended to diagnose MRSA infection nor to guide or monitor treatment for MRSA infections.      Radiology Studies: No results found.  Scheduled Meds: . brimonidine  1 drop Both Eyes BID  . carvedilol  12.5 mg Oral BID WC  . famotidine  20 mg Oral BID  . latanoprost  1 drop Both Eyes QHS  . losartan  100 mg Oral Daily  . polyvinyl alcohol  1 drop Both Eyes 6 X Daily  . potassium chloride  40 mEq Oral Q4H   Continuous Infusions: . sodium chloride 50 mL/hr at 12/16/15 0016     LOS: 9 days    Time spent: 25  minutes   Barton Dubois, MD [facility] Pager [phone]  If 7PM-7AM, please contact night-coverage [email or web address] Password [email or web address] 12/16/2015, 1:56 PM

## 2015-12-16 NOTE — Discharge Summary (Signed)
Physician Discharge Summary  Patient ID: John [name] MRN: [id number] DOB/AGE: [birth year] 75 y.o.  Admit date: 12/07/2015 Discharge date: 12/16/2015  Admission Diagnoses:  [name], with no prior abdominal surgical history. No prior history of [name]. Hypertension - Poor control  Chronic kidney disease - stable Hx of prostate CA - [name] - checking UA & culture  Discharge Diagnoses:  [name], with no prior abdominal surgical history. No prior history of [name]. Hypertension - Poor control - Medications adjusted in hospital by Medicine Service Chronic kidney disease - stable Hematuria with hx of prostate CA - [name] - UA unremarkable & culture pending  Active Problems:   Essential hypertension   Sickle cell trait (HCC)   [name] (small bowel obstruction)   PROCEDURES: S/p [name], 12/10/15, [name];   Hospital Course:  Patient's a well-nourished, well-developed, gentleman in relatively good health. He noted the onset of abdominal distention a little over 24 hours ago. He says is preceded by upper respiratory like symptoms with nasal congestion, possibly allergies. His distention became more progressive and became more and more painful. He's had nausea but no vomiting prior to being seen in the ED. He has never had a problem like this before. His only surgery, includes TURP, several cystoscopies, biopsies, with a history of prostate cancer. Treatment for his prostate cancer was holistic and the last biopsy showed no cancer.  On presentation to the ED he was hypertensive but afebrile. Labs show a creatinine of 1.6 for a glucose of 165 CBC was normal.  CT scan shows distended proximal and decompressed distal small bowel loops compatible with small bowel obstruction. Transition zone at the small bowel loop in the right mid abdomen unremarkable there's no definite bowel wall thickening upper normal caliber appendix in the right pelvis without  inflammatory changes. This was consistent with a small bowel obstruction and transition zone in the right mid abdomen. He does have marked prostatic enlargement and bilateral renal cyst. Some atelectasis and a questionable infiltrate left lower lobe. We are asked to see. He has had a colonoscopy about 5 years ago, he's not sure who did it but it was at [facility]. He was admitted and the nursing staff was unable to place the NG. He then declined further attempts at NG placement. Unfortunately his BP was up and poorly controlled, his urine output was low and his creatinine was elevated.  We ask medicine to see and help with these issues.  He continued to have significant abdominal distension and discomfort, he was not vomiting but very uncomfortable.  We held off on asking IR to place NG because of his already poorly controlled BP.  Finally in the PM of 12/08/15 Dr. Hulen Skains placed an NG.  The following AM he had 1600 out from his NG and was still having discomfort and without flatus or BM. The following day NG output was 2300, and he was taken to the OR on 12/10/15 for the above noted procedure.  Post op he has had an ileus but has now finally opened up.  He had some hematuria right after surgery and I attributed it to his foley.  Today he had some hematuria and possibly a clot that passed.  UA shows some RBC's but not many and he says this has cleared and he is better now.  Medicine has stopped the listed enalapril and furosemide.  He is now on Coreg 12.5 BID and losartan (preadmit drug also) 100 mg  daily.  BP shows good control.  His creatinine remains in the 1.3 to 1.4 range.  It was up to 1.7 during his admit.  Currently he feels much better and wants to go home.  He plans just to use Tylenol for pain.  He has follow up listed below.     CBC Latest Ref Rng & Units 12/14/2015 12/12/2015 12/11/2015  WBC 4.0 - 10.5 K/uL 9.1 6.9 6.7  Hemoglobin 13.0 - 17.0 g/dL 13.4 13.2 13.6  Hematocrit 39.0 - 52.0 % 38.5(L)  37.9(L) 38.2(L)  Platelets 150 - 400 K/uL 141(L) 125(L) 123(L)   CMP Latest Ref Rng & Units 12/16/2015 12/15/2015 12/14/2015  Glucose 65 - 99 mg/dL 111(H) 106(H) 112(H)  BUN 6 - 20 mg/dL 15 17 17   Creatinine 0.61 - 1.24 mg/dL 1.46(H) 1.29(H) 1.29(H)  Sodium 135 - 145 mmol/L 141 142 142  Potassium 3.5 - 5.1 mmol/L 3.3(L) 3.5 3.3(L)  Chloride 101 - 111 mmol/L 111 112(H) 111  CO2 22 - 32 mmol/L 24 24 24   Calcium 8.9 - 10.3 mg/dL 8.7(L) 8.6(L) 8.6(L)  Total Protein 6.5 - 8.1 g/dL - - -  Total Bilirubin 0.3 - 1.2 mg/dL - - -  Alkaline Phos 38 - 126 U/L - - -  AST 15 - 41 U/L - - -  ALT 17 - 63 U/L - - -   Condition on d/c:  Improved   Disposition: 01-Home or Self Care     Medication List    STOP taking these medications   COD LIVER OIL PO   furosemide 20 MG tablet Commonly known as:  LASIX     TAKE these medications   acetaminophen 500 MG tablet Commonly known as:  TYLENOL Take 500-1,000 mg by mouth every 6 (six) hours as needed.   bimatoprost 0.01 % Soln Commonly known as:  LUMIGAN Place 1 drop into both eyes at bedtime.   brimonidine 0.15 % ophthalmic solution Commonly known as:  ALPHAGAN Place 1 drop into both eyes 2 (two) times daily.   carvedilol 12.5 MG tablet Commonly known as:  COREG Take 1 tablet (12.5 mg total) by mouth 2 (two) times daily with a meal.   losartan 100 MG tablet Commonly known as:  COZAAR TAKE 1 TABLET BY MOUTH DAILY.   polyvinyl alcohol 1.4 % ophthalmic solution Commonly known as:  LIQUIFILM TEARS Place 1 drop into both eyes 6 (six) times daily.      Follow-up Information    [facility] Follow up on 12/21/2015.   Specialty:  General Surgery Why:  You will see the nurse for staple removal.  Your appointment is at [place].  Be at the office 30 minutes early for check in.  Contact information: [address] [place] [address] [phone]        Maia Petties., MD Follow up on 01/04/2016.   Specialty:   General Surgery Why:  Your appointment to see [name] is at 2:10 PM, be at the office 30 minutes early for check in.   Contact information: [address] [place] [address] [phone]        Noralee Space, MD .   Specialty:  Pulmonary Disease Why:  Call and make a follow up appointment to check your blood pressure and kidney function in 1-2 weeks.   Contact information: [address] [place] [address] [phone]        Dutch Gray, MD .   Specialty:  Urology Why:  Call and have him follow up on the blood in your urine. Contact information: [place] [address] [phone]           Signed: Earnstine Regal 12/16/2015, 3:19 PM

## 2015-12-16 NOTE — Progress Notes (Signed)
Pt feels better urine is clear now.  He will follow up with [name] next week to check urine.Tolerated lunch well and feels ready to go home.

## 2015-12-17 LAB — URINE CULTURE

## 2016-01-02 ENCOUNTER — Other Ambulatory Visit: Payer: Self-pay | Admitting: Pulmonary Disease

## 2016-01-18 ENCOUNTER — Encounter: Payer: Self-pay | Admitting: Pulmonary Disease

## 2016-01-23 ENCOUNTER — Ambulatory Visit
Admission: RE | Admit: 2016-01-23 | Discharge: 2016-01-23 | Disposition: A | Discharge status: Home / Self Care | Payer: Medicare Other | Source: Ambulatory Visit | Attending: Surgery | Admitting: Surgery

## 2016-01-23 ENCOUNTER — Other Ambulatory Visit: Payer: Self-pay | Admitting: Surgery

## 2016-01-23 DIAGNOSIS — R140 Abdominal distension (gaseous): Secondary | ICD-10-CM

## 2016-01-23 DIAGNOSIS — K56699 Other intestinal obstruction unspecified as to partial versus complete obstruction: Secondary | ICD-10-CM | POA: Diagnosis not present

## 2016-02-22 ENCOUNTER — Telehealth: Payer: Self-pay | Admitting: Pulmonary Disease

## 2016-02-22 NOTE — Telephone Encounter (Signed)
Pt said he may have to go out and if there is no answer on hm # please call him on his cell @ [phone].Hillery Hunter

## 2016-02-22 NOTE — Telephone Encounter (Signed)
Called and spoke with the pt  He states needing to know if he needs to continue taking coreg  Med was prescribed last hosp admit in Nov 2017 for [name]  He was supposed to f/u with SN 1-2 wks after d/c to recheck BP  I have scheduled him for 02/28/16 and advised to keep taking med until then He verbalized understanding and nothing further needed

## 2016-02-28 ENCOUNTER — Ambulatory Visit (INDEPENDENT_AMBULATORY_CARE_PROVIDER_SITE_OTHER): Payer: Medicare Other | Admitting: Pulmonary Disease

## 2016-02-28 VITALS — BP 140/86 | HR 63 | Temp 97.5°F | Ht 74.5 in | Wt 233.0 lb

## 2016-02-28 DIAGNOSIS — I872 Venous insufficiency (chronic) (peripheral): Secondary | ICD-10-CM

## 2016-02-28 DIAGNOSIS — I493 Ventricular premature depolarization: Secondary | ICD-10-CM

## 2016-02-28 DIAGNOSIS — K56609 Unspecified intestinal obstruction, unspecified as to partial versus complete obstruction: Secondary | ICD-10-CM | POA: Diagnosis not present

## 2016-02-28 DIAGNOSIS — I10 Essential (primary) hypertension: Secondary | ICD-10-CM | POA: Diagnosis not present

## 2016-02-28 NOTE — Progress Notes (Signed)
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 General;  Laterality: N/A;  . PROSTATE BIOPSY  02/2001   repeat in 11/2002 Dr. Lottie Rater at [facility]  . PROSTATE BIOPSY  01/03/2012   Procedure: BIOPSY TRANSRECTAL ULTRASONIC PROSTATE (TUBP);  Surgeon: Dutch Gray, MD;  Location: [facility];  Service: Urology;  Laterality: N/A;  45 MIN   . PROSTATE BIOPSY N/A 10/04/2014   Procedure: BIOPSY TRANSRECTAL ULTRASONIC PROSTATE (TUBP);  Surgeon: Raynelle Bring, MD;  Location: [facility];  Service: Urology;  Laterality: N/A;  . REFRACTIVE SURGERY     for glaucoma   . TRANSURETHRAL RESECTION OF PROSTATE N/A 03/27/2012   Procedure: TRANSURETHRAL RESECTION OF THE PROSTATE WITH GYRUS INSTRUMENTS;  Surgeon: Dutch Gray, MD;  Location: [facility];  Service: Urology;  Laterality: N/A;    Outpatient Encounter Prescriptions as of 02/28/2016  Medication Sig Dispense Refill  . bimatoprost (LUMIGAN) 0.01 % SOLN Place 1 drop into both eyes at bedtime.    . brimonidine (ALPHAGAN) 0.15 % ophthalmic solution Place 1 drop into both eyes 2 (two) times daily.   0  . losartan (COZAAR) 100 MG tablet TAKE 1 TABLET BY MOUTH DAILY. PT WILL NEED OFFICE VISIT FOR MORE REFILLS 90 tablet 0  . polyvinyl alcohol (LIQUIFILM TEARS) 1.4 % ophthalmic solution Place 1 drop into both eyes 6 (six) times daily.    . [DISCONTINUED] acetaminophen (TYLENOL) 500 MG tablet Take 500-1,000 mg by mouth every 6 (six) hours as needed.     . [DISCONTINUED] carvedilol (COREG) 12.5 MG tablet Take 1 tablet (12.5 mg total) by mouth 2 (two) times daily with a meal. (Patient not taking: Reported on 02/28/2016) 60 tablet 0   No facility-administered encounter medications on file as of 02/28/2016.     Allergies  Allergen Reactions  . Amlodipine Besylate Other (See Comments)    REACTION: causes nightmares  . Tamsulosin Other (See Comments)  dizziness    Review of Systems        See HPI - all other systems neg except as noted... The patient denies anorexia, fever, weight loss, weight gain, vision loss, decreased hearing, hoarseness, chest pain, syncope, dyspnea on exertion, peripheral edema, prolonged cough, headaches, hemoptysis, abdominal pain, melena, hematochezia, severe indigestion/heartburn, hematuria, incontinence, muscle weakness, suspicious skin lesions, transient blindness, difficulty walking, depression, unusual weight change, abnormal bleeding, enlarged lymph nodes, and angioedema.    Objective:   Physical Exam      WD, Overweight, 76 y/o BM in NAD... GENERAL:  Alert & oriented; pleasant & cooperative... HEENT:  [place]/AT, EOM-wnl, PERRLA, EACs-clear, TMs-wnl, NOSE-clear, THROAT-clear & wnl. NECK:  Supple w/ fairROM; no JVD; normal carotid impulses w/o bruits; no thyromegaly or nodules palpated; no lymphadenopathy. CHEST:  Clear to P & A; without wheezes/ rales/ or rhonchi. HEART:  Regular Rhythm; without murmurs/ rubs/ or gallops. ABDOMEN:  Soft & nontender; normal bowel sounds; no organomegaly or masses detected. Rectal exam is deferred... EXT: without deformities, mild arthritic changes noted; no varicose veins/ +venous insuffic= 1+ & tr edema. NEURO:  CN's intact; motor testing normal; sensory testing normal; gait normal & balance OK. DERM:  No lesions noted; no rash etc...  RADIOLOGY DATA:  Reviewed in the EPIC EMR & discussed w/ the patient...  LABORATORY DATA:  Reviewed in the EPIC EMR & discussed w/ the  patient...   Assessment & Plan:    Ven Insuffic w/ edema>  We reviewed plans for low sodium, elev legs, wear support hose,  Decided to ADD LASIX20mg /d & watch BP & renal function...  HBP>  BP is fair on Coreg12.5Bid + Cozaar100, he will monitor BP at home...  CHOL>  Remains on diet alone w/ fair numbers, he refuses med Rx...  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  Son has AS hemoglobin>  Pt wants test for sickle trait & we will order a hemoglobin electrophoresis at his request (test wasn't carried out by the lab)...   Patient's Medications  New Prescriptions   No medications on file  Previous Medications   BIMATOPROST (LUMIGAN) 0.01 % SOLN    Place 1 drop into both eyes at bedtime.   BRIMONIDINE (ALPHAGAN) 0.15 % OPHTHALMIC SOLUTION    Place 1 drop into both eyes 2 (two) times daily.    LOSARTAN (COZAAR) 100 MG TABLET    TAKE 1 TABLET BY MOUTH DAILY. PT WILL NEED OFFICE VISIT FOR MORE REFILLS   POLYVINYL ALCOHOL (LIQUIFILM TEARS) 1.4 % OPHTHALMIC SOLUTION    Place 1 drop into both eyes 6 (six) times daily.  Modified Medications   No medications on file  Discontinued Medications   ACETAMINOPHEN (TYLENOL) 500 MG TABLET    Take 500-1,000 mg by mouth every 6 (six) hours as needed.   CARVEDILOL (COREG) 12.5 MG TABLET    Take 1 tablet (12.5 mg total) by mouth 2 (two) times daily with a meal.

## 2016-02-28 NOTE — Patient Instructions (Signed)
Today we updated your med list in our EPIC system...    Continue your current medications the same...  We decided to try your BP control on LOSARTAN 100mg /d by itself...    PLUS no salt, wt reduction, increase exercise...  Monitor your BP at home & all me if it is staying above [id number] systolic...    We can then start another medication by phone prior to your next follow up...  Call for any questions...  Let's plan a follow up visit in 57months, sooner if needed for problems.Marland KitchenMarland Kitchen

## 2016-04-05 ENCOUNTER — Other Ambulatory Visit: Payer: Self-pay | Admitting: Pulmonary Disease

## 2016-04-19 ENCOUNTER — Telehealth: Payer: Self-pay | Admitting: Pulmonary Disease

## 2016-04-23 NOTE — Telephone Encounter (Signed)
No message in chart.  Will close as an error

## 2016-04-27 DIAGNOSIS — C61 Malignant neoplasm of prostate: Secondary | ICD-10-CM | POA: Diagnosis not present

## 2016-05-04 DIAGNOSIS — C61 Malignant neoplasm of prostate: Secondary | ICD-10-CM | POA: Diagnosis not present

## 2016-05-15 DIAGNOSIS — H40033 Anatomical narrow angle, bilateral: Secondary | ICD-10-CM | POA: Diagnosis not present

## 2016-05-15 DIAGNOSIS — H401133 Primary open-angle glaucoma, bilateral, severe stage: Secondary | ICD-10-CM | POA: Diagnosis not present

## 2016-05-16 ENCOUNTER — Ambulatory Visit: Payer: Self-pay | Admitting: Pulmonary Disease

## 2016-05-16 ENCOUNTER — Encounter: Payer: Self-pay | Admitting: Pulmonary Disease

## 2016-08-28 ENCOUNTER — Other Ambulatory Visit (INDEPENDENT_AMBULATORY_CARE_PROVIDER_SITE_OTHER): Payer: Medicare Other

## 2016-08-28 ENCOUNTER — Encounter: Payer: Self-pay | Admitting: Pulmonary Disease

## 2016-08-28 ENCOUNTER — Ambulatory Visit (INDEPENDENT_AMBULATORY_CARE_PROVIDER_SITE_OTHER): Payer: Medicare Other | Admitting: Pulmonary Disease

## 2016-08-28 VITALS — BP 142/100 | HR 64 | Temp 98.1°F | Ht 74.5 in | Wt 227.2 lb

## 2016-08-28 DIAGNOSIS — D126 Benign neoplasm of colon, unspecified: Secondary | ICD-10-CM | POA: Diagnosis not present

## 2016-08-28 DIAGNOSIS — I872 Venous insufficiency (chronic) (peripheral): Secondary | ICD-10-CM | POA: Diagnosis not present

## 2016-08-28 DIAGNOSIS — K56609 Unspecified intestinal obstruction, unspecified as to partial versus complete obstruction: Secondary | ICD-10-CM

## 2016-08-28 DIAGNOSIS — F411 Generalized anxiety disorder: Secondary | ICD-10-CM | POA: Diagnosis not present

## 2016-08-28 DIAGNOSIS — K581 Irritable bowel syndrome with constipation: Secondary | ICD-10-CM

## 2016-08-28 DIAGNOSIS — R7989 Other specified abnormal findings of blood chemistry: Secondary | ICD-10-CM

## 2016-08-28 DIAGNOSIS — E7800 Pure hypercholesterolemia, unspecified: Secondary | ICD-10-CM | POA: Diagnosis not present

## 2016-08-28 DIAGNOSIS — C61 Malignant neoplasm of prostate: Secondary | ICD-10-CM

## 2016-08-28 DIAGNOSIS — I10 Essential (primary) hypertension: Secondary | ICD-10-CM | POA: Diagnosis not present

## 2016-08-28 DIAGNOSIS — E559 Vitamin D deficiency, unspecified: Secondary | ICD-10-CM | POA: Diagnosis not present

## 2016-08-28 DIAGNOSIS — I493 Ventricular premature depolarization: Secondary | ICD-10-CM

## 2016-08-28 LAB — COMPREHENSIVE METABOLIC PANEL
ALK PHOS: 78 U/L (ref 39–117)
ALT: 17 U/L (ref 0–53)
AST: 18 U/L (ref 0–37)
Albumin: 4.2 g/dL (ref 3.5–5.2)
BUN: 18 mg/dL (ref 6–23)
CHLORIDE: 107 meq/L (ref 96–112)
CO2: 29 meq/L (ref 19–32)
Calcium: 9.6 mg/dL (ref 8.4–10.5)
Creatinine, Ser: 1.45 mg/dL (ref 0.40–1.50)
GFR: 61.26 mL/min (ref >60.00)
GLUCOSE: 103 mg/dL — AB (ref 70–99)
POTASSIUM: 3.9 meq/L (ref 3.5–5.1)
Sodium: 144 mEq/L (ref 135–145)
TOTAL PROTEIN: 6.8 g/dL (ref 6.0–8.3)
Total Bilirubin: 0.7 mg/dL (ref 0.2–1.2)

## 2016-08-28 LAB — CBC WITH DIFFERENTIAL/PLATELET
Basophils Absolute: 0.1 K/uL (ref 0.0–0.1)
Basophils Relative: 1.5 % (ref 0.0–3.0)
Eosinophils Absolute: 0.1 K/uL (ref 0.0–0.7)
Eosinophils Relative: 1 % (ref 0.0–5.0)
HCT: 42.6 % (ref 39.0–52.0)
Hemoglobin: 14.5 g/dL (ref 13.0–17.0)
Lymphocytes Relative: 40.4 % (ref 12.0–46.0)
Lymphs Abs: 2.1 K/uL (ref 0.7–4.0)
MCHC: 34 g/dL (ref 30.0–36.0)
MCV: 78.5 fl (ref 78.0–100.0)
Monocytes Absolute: 0.3 K/uL (ref 0.1–1.0)
Monocytes Relative: 5.7 % (ref 3.0–12.0)
Neutro Abs: 2.6 K/uL (ref 1.4–7.7)
Neutrophils Relative %: 51.4 % (ref 43.0–77.0)
Platelets: 134 K/uL — ABNORMAL LOW (ref 150.0–400.0)
RBC: 5.42 Mil/uL (ref 4.22–5.81)
RDW: 15.5 % (ref 11.5–15.5)
WBC: 5.1 K/uL (ref 4.0–10.5)

## 2016-08-28 LAB — LIPID PANEL
Cholesterol: 197 mg/dL (ref 0–200)
HDL: 46.4 mg/dL (ref >39.00)
LDL Cholesterol: 138 mg/dL — ABNORMAL HIGH (ref 0–99)
NonHDL: 150.4
Total CHOL/HDL Ratio: 4
Triglycerides: 64 mg/dL (ref 0.0–149.0)
VLDL: 12.8 mg/dL (ref 0.0–40.0)

## 2016-08-28 LAB — TSH: TSH: 1.26 u[IU]/mL (ref 0.35–4.50)

## 2016-08-28 LAB — VITAMIN D 25 HYDROXY (VIT D DEFICIENCY, FRACTURES): VITD: 23.98 ng/mL — AB (ref 30.00–100.00)

## 2016-08-28 LAB — PSA: PSA: 13.97 ng/mL — AB (ref 0.10–4.00)

## 2016-08-28 MED ORDER — CARVEDILOL 12.5 MG PO TABS: 12.5000 mg | ORAL_TABLET | Freq: Two times a day (BID) | ORAL | 1 refills | Status: DC

## 2016-08-28 NOTE — Progress Notes (Signed)
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 decision made for "watchful waiting" w/ q40mo follow up in [facility]- last seen 9/09 and PSA was 10.3.Marland Kitchen. ~  7/10: pt informs me that he is no longer going to [facility] and wants a Tax adviser... refer to John [name] at [facility]... we did his PSA= 13.85... ~  3/11:  pt still on watchful waiting protocol per his decision... PSA today = 18.36... refer back to John [name]> note indicates that he wants to repeat bx & pt agreed> Bx done 6/11 & John [name] notes indicates it was pos for low vol Gleason 6 prostate cancer & pt chose to continue surveillance; pt indicated "there was no cancer there" but we discussed the diagnosis... ~  1/12:  f/u note from John [name] reviewed> continues on watchful waiting, tried Finasteride for BPH symptoms... ~  8/12:  f/u note from John [name] reviewed> pt declined incr meds either incr dose of doxazosin or adding Finasteride, continue watchful waiting... ~  2/13:  f/u note from John [name] reviewed> prostate ca, BPH/ LUTS, ED> PSA was 15.28 & pt wanted to continue surveillance... ~  7-9/13: 2 ER visits & f/u John [name]> UTI w/ EColi & elev PSA- treated w/ Sulfa, developed obstructive uropathy as noted above==> TURP (see Sept 4, 2013 above)... ~  2/14:  He had TURP by John [name] & voiding is better since then; Bx was pos for Gleason 3+3=6 adenocarcinoma & he prefers surveillance & holistic therapies... ~  He had f/u visit John [name] 4/15> known prostate cancer & BPH w/ LTOS; s/p TURP 2/14 &  voiding is improved; PSA stable ~12, continues on watchful waiting... ~  1/16: Hx obstructive uropathy- BPH, prostate cancer, renal insuffic; s/p TURP 2/14 by John [name], they continue to monitor his PSA on watchful  waiting protocol.   Hx of GYNECOMASTIA (ICD-611.1) - Eval for L Gynecomastia 5/01 in [place]; FNA bx was neg... he has been taking an "immune enhancer" from [place] in [place], [place] (pt asked to bring bottle in for [place] to review).     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 ~  Labs 3/11 showed Hg= 14.0, MCV= 81, Plat= 147K ~  Labs 5/12 showed Hg= 14.6, MCV= 82, Plat= 131K ~  Labs 7/13 showed Hg= 13.8, MCV= 80, Plat= 118K ~  Labs 7/14 showed Hg= 13.6, Plat= 123K ~  Labs 7/15 showed Hg= 14.0, Plat= 136K...  Health Maint:  pt still taking supplemental "Immune Enhancer" - he states "I can tell a difference, I have more energy".   Past Surgical History:  Procedure Laterality Date  . ARM SURGERY  AGE 40   patient denies at preop appt of 05/20/2014    . COLONOSCOPY W/ BIOPSIES AND POLYPECTOMY  ~ 2002  . CYSTOSCOPY  01/03/2012   Procedure: CYSTOSCOPY FLEXIBLE;  Surgeon: Dutch Gray, MD;  Location: [facility];  Service: Urology;  Laterality: N/A;  . CYSTOSCOPY N/A 03/27/2012   Procedure: CYSTOSCOPY;  Surgeon: Dutch Gray, MD;  Location: [facility];  Service: Urology;  Laterality: N/A;  . CYSTOSCOPY N/A 10/04/2014   Procedure: CYSTOSCOPY FLEXIBLE;  Surgeon: Raynelle Bring, MD;  Location: [facility];  Service: Urology;  Laterality: N/A;  . LAPAROTOMY N/A 12/10/2015   Procedure: EXPLORATORY LAPAROTOMY FOR [name];  Surgeon: Donnie Mesa, MD;  Location: [facility];  Service: General;   Laterality: N/A;  . PROSTATE BIOPSY  02/2001   repeat in 11/2002 Dr. Lottie Rater at [facility]  . PROSTATE BIOPSY  01/03/2012   Procedure: BIOPSY TRANSRECTAL ULTRASONIC PROSTATE (TUBP);  Surgeon: Dutch Gray, MD;  Location: [facility];  Service: Urology;  Laterality: N/A;  45 MIN   . PROSTATE BIOPSY N/A 10/04/2014   Procedure: BIOPSY TRANSRECTAL ULTRASONIC PROSTATE (TUBP);  Surgeon: Raynelle Bring, MD;  Location: [facility];  Service: Urology;  Laterality: N/A;  . REFRACTIVE SURGERY     for glaucoma   . TRANSURETHRAL RESECTION OF PROSTATE N/A 03/27/2012   Procedure: TRANSURETHRAL RESECTION OF THE PROSTATE WITH GYRUS INSTRUMENTS;  Surgeon: Dutch Gray, MD;  Location: [facility];  Service: Urology;  Laterality: N/A;    Outpatient Encounter Prescriptions as of 08/28/2016  Medication Sig  . bimatoprost (LUMIGAN) 0.01 % SOLN Place 1 drop into both eyes at bedtime.  . brimonidine (ALPHAGAN) 0.15 % ophthalmic solution Place 1 drop into both eyes 2 (two) times daily.   Marland Kitchen losartan (COZAAR) 100 MG tablet Take 1 tablet (100 mg total) by mouth daily.  . carvedilol (COREG) 12.5 MG tablet Take 1 tablet (12.5 mg total) by mouth 2 (two) times daily with a meal.  . polyvinyl alcohol (LIQUIFILM TEARS) 1.4 % ophthalmic solution Place 1 drop into both eyes 6 (six) times daily.   No facility-administered encounter medications on file as of 08/28/2016.     Allergies  Allergen Reactions  . Amlodipine Besylate Other (See Comments)    REACTION: causes nightmares  . Tamsulosin Other (See Comments)     dizziness    Immunization History  Administered Date(s) Administered  . PPD  Test 04/24/2011  . Tdap 09/08/2013  Pt has refused FLU shots and the PNEUMONIA vaccines...    Current Medications, Allergies, Past Medical History, Past Surgical History, Family History, and Social History were reviewed in [employer] record.   Review of Systems        See HPI - all other systems neg except as  noted... The patient denies anorexia, fever, weight loss, weight gain, vision loss, decreased hearing, hoarseness, chest pain, syncope, dyspnea on exertion, peripheral edema, prolonged cough, headaches, hemoptysis, abdominal pain, melena, hematochezia, severe indigestion/heartburn, hematuria, incontinence, muscle weakness, suspicious skin lesions, transient blindness, difficulty walking, depression, unusual weight change, abnormal bleeding, enlarged lymph nodes, and angioedema.    Objective:   Physical Exam      WD, Overweight, 76 y/o BM in NAD... GENERAL:  Alert & oriented; pleasant & cooperative... HEENT:  [place]/AT, EOM-wnl, PERRLA, EACs-clear, TMs-wnl, NOSE-clear, THROAT-clear & wnl. NECK:  Supple w/ fairROM; no JVD; normal carotid impulses w/o bruits; no thyromegaly or nodules palpated; no lymphadenopathy. CHEST:  Clear to P & A; without wheezes/ rales/ or rhonchi. HEART:  Regular Rhythm; without murmurs/ rubs/ or gallops. ABDOMEN:  Soft & nontender; normal bowel sounds; no organomegaly or masses detected. Rectal exam is deferred... EXT: without deformities, mild arthritic changes noted; no varicose veins/ +venous insuffic= 1+ & tr edema. NEURO:  CN's intact; motor testing normal; sensory testing normal; gait normal & balance OK. DERM:  No lesions noted; no rash etc...  RADIOLOGY DATA:  Reviewed in the EPIC EMR & discussed w/ the patient...  LABORATORY DATA:  Reviewed in the EPIC EMR & discussed w/ the patient...   Assessment & Plan:    08/28/16>   [place] is rec to restart the COREG 12.5mg  Bid;  He does not want Chol med, therefore diet rx alone;  rec to start VitD 2000u daily;  Prostate f/u every 28mo by John [name];  He's under a lot of stress w/ wife's colon cancer.   Ven Insuffic w/ edema>  We reviewed plans for low sodium, elev legs, wear support hose,  Decided to ADD LASIX20mg /d & watch BP & renal function...  HBP>  BP is fair on Coreg12.5Bid + Cozaar100, he will monitor BP at  home...  CHOL>  Remains on diet alone w/ fair numbers, he refuses med Rx...  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  Son has AS hemoglobin>  Pt wants test for sickle trait & we will order a hemoglobin electrophoresis at his request (test wasn't carried out by the lab)...   Patient's Medications  New Prescriptions   CARVEDILOL (COREG) 12.5 MG TABLET    Take 1 tablet (12.5 mg total) by mouth 2 (two) times daily with a meal.  Previous Medications   BIMATOPROST (LUMIGAN) 0.01 % SOLN    Place 1 drop into both eyes at bedtime.   BRIMONIDINE (ALPHAGAN) 0.15 % OPHTHALMIC SOLUTION    Place 1 drop into both eyes 2 (two) times daily.    LOSARTAN (COZAAR) 100 MG TABLET  Take 1 tablet (100 mg total) by mouth daily.   POLYVINYL ALCOHOL (LIQUIFILM TEARS) 1.4 % OPHTHALMIC SOLUTION    Place 1 drop into both eyes 6 (six) times daily.  Modified Medications   No medications on file  Discontinued Medications   No medications on file

## 2016-08-28 NOTE — Patient Instructions (Addendum)
Today we updated your med list in our EPIC system...    Continue your current medications the same...  Today we decided to re-start your COREG (Carvedilol) BP medication- 12.5mg  tabs take one tab twice daily...    Continue your LOSARTAN 100mg  one tab daily...    Continue to monitor your BP at home...  Remember to avoid salt/ sodium!!!  Today we also checked your follow up FASTING blood work...    We will contact you w/ the results when available...   Call for any questions...  Let's plan a follow up visit in 33mo, sooner if needed for problems.Marland KitchenMarland Kitchen

## 2016-11-16 ENCOUNTER — Other Ambulatory Visit: Payer: Self-pay | Admitting: Pulmonary Disease

## 2016-12-26 ENCOUNTER — Telehealth: Payer: Self-pay | Admitting: Pulmonary Disease

## 2016-12-26 NOTE — Telephone Encounter (Signed)
Spoke with pt and he states Losartan has been pulled from the market and wants to know what SN wants him to take at this point because it is time for him to get refills on it. SN please advise.

## 2016-12-27 MED ORDER — OLMESARTAN MEDOXOMIL 40 MG PO TABS: 40.0000 mg | ORAL_TABLET | Freq: Every day | ORAL | 2 refills | Status: DC

## 2016-12-27 NOTE — Telephone Encounter (Signed)
Spoke with patient. He is aware of SN's recs. Olmesartan was called into [facility] on [address].   Nothing else needed at time of call.

## 2016-12-27 NOTE — Telephone Encounter (Signed)
Per SN: okay to change to generic Benicar 40mg  1po QD #30 with PRN refills  Thank you

## 2017-02-28 ENCOUNTER — Ambulatory Visit: Payer: Medicare Other | Admitting: Pulmonary Disease

## 2017-02-28 ENCOUNTER — Encounter: Payer: Self-pay | Admitting: Pulmonary Disease

## 2017-02-28 VITALS — BP 134/80 | HR 68 | Temp 97.3°F | Ht 74.5 in | Wt 225.2 lb

## 2017-02-28 DIAGNOSIS — Z8719 Personal history of other diseases of the digestive system: Secondary | ICD-10-CM | POA: Diagnosis not present

## 2017-02-28 DIAGNOSIS — I10 Essential (primary) hypertension: Secondary | ICD-10-CM

## 2017-02-28 DIAGNOSIS — N139 Obstructive and reflux uropathy, unspecified: Secondary | ICD-10-CM

## 2017-02-28 DIAGNOSIS — E7800 Pure hypercholesterolemia, unspecified: Secondary | ICD-10-CM

## 2017-02-28 DIAGNOSIS — I493 Ventricular premature depolarization: Secondary | ICD-10-CM | POA: Diagnosis not present

## 2017-02-28 DIAGNOSIS — Z0000 Encounter for general adult medical examination without abnormal findings: Secondary | ICD-10-CM | POA: Diagnosis not present

## 2017-02-28 DIAGNOSIS — K581 Irritable bowel syndrome with constipation: Secondary | ICD-10-CM

## 2017-02-28 DIAGNOSIS — D573 Sickle-cell trait: Secondary | ICD-10-CM

## 2017-02-28 DIAGNOSIS — C61 Malignant neoplasm of prostate: Secondary | ICD-10-CM

## 2017-02-28 DIAGNOSIS — D126 Benign neoplasm of colon, unspecified: Secondary | ICD-10-CM | POA: Diagnosis not present

## 2017-02-28 DIAGNOSIS — I872 Venous insufficiency (chronic) (peripheral): Secondary | ICD-10-CM | POA: Diagnosis not present

## 2017-02-28 DIAGNOSIS — F411 Generalized anxiety disorder: Secondary | ICD-10-CM | POA: Diagnosis not present

## 2017-02-28 NOTE — Progress Notes (Signed)
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 he denies HA, fatigue, visual changes, CP, palipit, dizziness, syncope, dyspnea, edema, etc... we discussed 2DEcho to eval for LVH ==> finall done 11/11 & was WNL, no LVH etc... ~  11/11:  BP up at 150/100 today & we discussed diet, no salt, & start DOXAZOSIN 8mg /d. ~  5/12:  BP= 160/90 & he admits to not taking the Doxazosin regularly, asked to do so (so we can assess response)... ~  11/12:  BP= 148/80 & reminded to take the Doxazosin regularly but they want to decr the dose to 1/2 tab daily due to mood changes & forgetfulness that they are sure is coming from this med. ~  7/13:  BP= 132/86 & he denies CP, palpit, dizzy, SOB, etc... ~  9/13:  BP= 160/90 w/ his obstructive uropathy & pain; on Cardura8mg /d- encouraged to take it regularly which he hasn't been doing... ~  7/14:  They called 3/14 w/ elev BP & they wanted to change off the Cardura; we switched to Losartan100 & BP improved- BP= 146/82 7 he remains asymptomatic w/o CP, palpit, dizzy, SOB, edema... ~  7/15:  on Losartan100; BP= 154/90 & he denies CP, palpit, dizzy, SOB, edema; they feel that all meds cause mood changes (they declined counseling) & now night sweats- rec to try the Losar100 Qam + diet & exerc. ~  CXR 7/15 showed norm heart size, clear lungs, NAD... ~  1/16: BP treated w/ Losar100 monotherapy but he has gained wt to 232# (BMI~29) and not on diet or restricting sodium; advised to elim sodium, get wt down, take Losar100 daily (may  need additional meds if BP remains elev  Hx of PREMATURE VENTRICULAR CONTRACTIONS (ICD-427.69), & DYSRHYTHMIA, CARDIAC NOS (ICD-427.9) -  on ASA 81mg /d... Remote hx of PVC's w/ eval 1986 showing rare PVC's on holter monitor;  no cardiac symptoms; EKG w/ L axis, poor R prog V1-3, NAD.  Venous Insuffic & Edema >> He noted some LE swelling & we discussed the need for low sodium diet, elevation, support hose, & prev on LASIX 20mg /d as needed...  HYPERCHOLESTEROLEMIA, MILD (ICD-272.0) - hx mild elevation of TChol & LDL in the past- he prefers to control w/ diet + exercise therapy and doesn't want statin med Rx...  ~  [place] 12/09 showed TChol 207, Tg 52, HDL 38, LDL 141... he prefers diet. ~  FLP 7/10 showed TChol 177, TG 50, HDL 40, LDL 127 ~  FLP 3/11 showed TChol 191, TG 64, HDL 47, LDL 131 ~  FLP 5/12 showed TChol 181, TG 40, HDL 47, LDL 126... He continues to refuse med rx. ~  FLP 7/13 on diet alone showed TChol 185, TG 47, HDL 48, LDL 128 ~  FLP 7/14 on diet alone showed TChol 177, TG 39, HDL 48, LDL 122 ~  FLP 7/15 on diet alone showed TChol 188, TG 54, HDL 44, LDL 133- he is content w/ these numbers & doesn't want meds.     Hx of IRRITABLE BOWEL SYNDROME (ICD-564.1) - he denies abd pain, N/V, D/C, or blood seen... CONSTIPATION >> several abd films have shown increased right colon stool burden & he is encouraged to take [place] regularly...  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 ~  7/10: he denies nausea, vomiting, heartburn, diarrhea, constipation, blood in stool, abdominal pain, swelling, gas... ~  5/12: pt finally had his f/u colonoscopy> 2 polyps- one tubular adenoma w/ f/u suggested 13yrs...  Hx of HEMORRHOIDS (ICD-455.6) - Hx hemorroid surg by DrGerkin 10/03     PROSTATE CANCER (ICD-185) - Dx w/ Prostate Ca 1/03 by DrPeterson & offered surg vs seeds;  second  opinion w/ DrDalstedt 2/03 w/ long discussion re: options;  discussed seed Rx w/ DrGoodchild 3/03;  pt preferred herbal Rx- and took [place];  4th opinion w/ Dr Lottie Rater @ [facility] 8/04 w/ repeat bx which was neg & decision made for "watchful waiting" w/ q68mo follow up in [facility]- last seen 9/09 and PSA was 10.3.Marland Kitchen. ~  7/10: pt informs me that he is no longer going to [facility] and wants a Tax adviser... refer to DrBorden at [facility]... we did his PSA= 13.85... ~  3/11:  pt still on watchful waiting protocol per his decision... PSA today = 18.36... refer back to DrBorden> note indicates that he wants to repeat bx & pt agreed> Bx done 6/11 & DrBorden's notes indicates it was pos for low vol Gleason 6 prostate cancer & pt chose to continue surveillance; pt indicated "there was no cancer there" but we discussed the diagnosis... ~  1/12:  f/u note from DrBorden reviewed> continues on watchful waiting, tried Finasteride for BPH symptoms... ~  8/12:  f/u note from DrBorden reviewed> pt declined incr meds either incr dose of doxazosin or adding Finasteride, continue watchful waiting... ~  2/13:  f/u note from DrBorden reviewed> prostate ca, BPH/ LUTS, ED> PSA was 15.28 & pt wanted to continue surveillance... ~  7-9/13: 2 ER visits & f/u DrBorden> UTI w/ EColi & elev PSA- treated w/ Sulfa, developed obstructive uropathy as noted above==> TURP (see  Sept 4, 2013 above)... ~  2/14:  He had TURP by DrBorden & voiding is better since then; Bx was pos for Gleason 3+3=6 adenocarcinoma & he prefers surveillance & holistic therapies... ~  He had f/u visit DrBorden 4/15> known prostate cancer & BPH w/ LTOS; s/p TURP 2/14 & voiding is improved; PSA stable ~12, continues on watchful waiting... ~  1/16: Hx obstructive uropathy- BPH, prostate cancer, renal insuffic; s/p TURP 2/14 by DrBorden, they continue to monitor his PSA on watchful waiting protocol.   Hx of GYNECOMASTIA (ICD-611.1) - Eval  for L Gynecomastia 5/01 in [place]; FNA bx was neg... he has been taking an "immune enhancer" from [facility] in [place], [place] (pt asked to bring bottle in for [place] to review).     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 ~  Labs 3/11 showed Hg= 14.0, MCV= 81, Plat= 147K ~  Labs 5/12 showed Hg= 14.6, MCV= 82, Plat= 131K ~  Labs 7/13 showed Hg= 13.8, MCV= 80, Plat= 118K ~  Labs 7/14 showed Hg= 13.6, Plat= 123K ~  Labs 7/15 showed Hg= 14.0, Plat= 136K...  Health Maint:  pt still taking supplemental "Immune Enhancer" - he states "I can tell a difference, I have more energy". He refuses all adult vaccinations...   Past Surgical History:  Procedure Laterality Date  . ARM SURGERY  AGE 19   patient denies at preop appt of 05/20/2014    . COLONOSCOPY W/ BIOPSIES AND POLYPECTOMY  ~ 2002  . CYSTOSCOPY  01/03/2012   Procedure: CYSTOSCOPY FLEXIBLE;  Surgeon: Dutch Gray, MD;  Location: [facility];  Service: Urology;  Laterality: N/A;  . CYSTOSCOPY N/A 03/27/2012   Procedure: CYSTOSCOPY;  Surgeon: Dutch Gray, MD;  Location: [facility];  Service: Urology;  Laterality: N/A;  . CYSTOSCOPY N/A 10/04/2014   Procedure: CYSTOSCOPY FLEXIBLE;  Surgeon: Raynelle Bring, MD;  Location: [facility];  Service: Urology;  Laterality: N/A;  . LAPAROTOMY N/A 12/10/2015   Procedure: EXPLORATORY LAPAROTOMY FOR [name];  Surgeon: Donnie Mesa, MD;  Location: [place];  Service: General;  Laterality: N/A;  . PROSTATE BIOPSY  02/2001   repeat in 11/2002 Dr. Lottie Rater at [facility]  . PROSTATE BIOPSY  01/03/2012   Procedure: BIOPSY TRANSRECTAL ULTRASONIC PROSTATE (TUBP);  Surgeon: Dutch Gray, MD;  Location: [facility];   Service: Urology;  Laterality: N/A;  45 MIN   . PROSTATE BIOPSY N/A 10/04/2014   Procedure: BIOPSY TRANSRECTAL ULTRASONIC PROSTATE (TUBP);  Surgeon: Raynelle Bring, MD;  Location: [facility];  Service: Urology;  Laterality: N/A;  . REFRACTIVE SURGERY     for glaucoma   . TRANSURETHRAL RESECTION OF PROSTATE N/A 03/27/2012   Procedure: TRANSURETHRAL RESECTION OF THE PROSTATE WITH GYRUS INSTRUMENTS;  Surgeon: Dutch Gray, MD;  Location: [facility];  Service: Urology;  Laterality: N/A;    Outpatient Encounter Medications as of 02/28/2017  Medication Sig  . bimatoprost (LUMIGAN) 0.01 % SOLN Place 1 drop into both eyes at bedtime.  . brimonidine (ALPHAGAN) 0.15 % ophthalmic solution Place 1 drop into both eyes 2 (two) times daily.   . carvedilol (COREG) 12.5 MG tablet Take 1 tablet (12.5 mg total) by mouth 2 (two) times daily with a  meal.  . Multiple Vitamin (MULTIVITAMIN) tablet Take 1 tablet by mouth daily.  . Turmeric (CURCUMIN 95) 500 MG CAPS Take 1 capsule by mouth daily.  Marland Kitchen olmesartan (BENICAR) 40 MG tablet Take 1 tablet (40 mg total) daily by mouth. (Patient not taking: Reported on 02/28/2017)  . polyvinyl alcohol (LIQUIFILM TEARS) 1.4 % ophthalmic solution Place 1 drop into both eyes 6 (six) times daily.   No facility-administered encounter medications on file as of 02/28/2017.     Allergies  Allergen Reactions  . Amlodipine Besylate Other (See Comments)    REACTION: causes nightmares  . Tamsulosin Other (See Comments)     dizziness    Immunization History  Administered Date(s) Administered  . PPD Test 04/24/2011  . Tdap 09/08/2013  Pt has repeatedly refused FLU shots and the PNEUMONIA vaccines...    Current Medications, Allergies, Past Medical History, Past Surgical History, Family History, and Social History were reviewed in [employer] record.   Review of Systems        See HPI - all other systems neg except as noted... The patient denies anorexia, fever,  weight loss, weight gain, vision loss, decreased hearing, hoarseness, chest pain, syncope, dyspnea on exertion, peripheral edema, prolonged cough, headaches, hemoptysis, abdominal pain, melena, hematochezia, severe indigestion/heartburn, hematuria, incontinence, muscle weakness, suspicious skin lesions, transient blindness, difficulty walking, depression, unusual weight change, abnormal bleeding, enlarged lymph nodes, and angioedema.    Objective:   Physical Exam      WD, Overweight, 77 y/o BM in NAD... GENERAL:  Alert & oriented; pleasant & cooperative... HEENT:  [place]/AT, EOM-wnl, PERRLA, EACs-clear, TMs-wnl, NOSE-clear, THROAT-clear & wnl. NECK:  Supple w/ fairROM; no JVD; normal carotid impulses w/o bruits; no thyromegaly or nodules palpated; no lymphadenopathy. CHEST:  Clear to P & A; without wheezes/ rales/ or rhonchi. HEART:  Regular Rhythm; without murmurs/ rubs/ or gallops. ABDOMEN:  Soft & nontender; normal bowel sounds; no organomegaly or masses detected. Rectal exam is deferred... EXT: without deformities, mild arthritic changes noted; no varicose veins/ +venous insuffic= 1+ & tr edema. NEURO:  CN's intact; motor testing normal; sensory testing normal; gait normal & balance OK. DERM:  No lesions noted; no rash etc...  RADIOLOGY DATA:  Reviewed in the EPIC EMR & discussed w/ the patient...  LABORATORY DATA:  Reviewed in the EPIC EMR & discussed w/ the patient...   Assessment & Plan:    08/28/16>   [place] is rec to restart the COREG 12.5mg  Bid;  He does not want Chol med, therefore diet rx alone;  rec to start VitD 2000u daily;  Prostate f/u every 61mo by DrBorden;  He's under a lot of stress w/ wife's colon cancer. 02/28/17>   Rual has been going thru a difficult time w/ the passing of his wife in Dec;  rec to incr fiber, take align, & we will refer to GI- DrJacobs for f/u colonoscopy overdue; pt still refuses all vaccinations;  He will maintain f/u w/ GU- DrBorden for his prostate  cancer and return to see [place] in 78mo, sooner if needed prn.   Ven Insuffic w/ edema>  We reviewed plans for low sodium, elev legs, wear support hose,  Decided to ADD LASIX20mg /d & watch BP & renal function...  HBP>  BP is fair on Coreg12.5Bid + Cozaar100, he will monitor BP at home...  CHOL>  Remains on diet alone w/ fair numbers, he refuses med Rx...  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  Son has AS hemoglobin>  Pt wants test for sickle trait & we will order a hemoglobin electrophoresis at his request (test wasn't carried out by the lab)...   Patient's Medications  New Prescriptions   No medications on file  Previous Medications   BIMATOPROST (LUMIGAN) 0.01 % SOLN    Place 1 drop into both eyes at bedtime.   BRIMONIDINE (ALPHAGAN) 0.15 % OPHTHALMIC SOLUTION    Place 1 drop into both eyes 2 (two) times daily.    CARVEDILOL (COREG) 12.5 MG TABLET    Take 1 tablet (12.5 mg total) by mouth 2 (two) times daily with a meal.   MULTIPLE VITAMIN (MULTIVITAMIN) TABLET    Take 1 tablet by mouth daily.   OLMESARTAN (BENICAR) 40 MG TABLET    Take 1 tablet (40 mg total) daily by mouth.   POLYVINYL ALCOHOL (LIQUIFILM TEARS) 1.4 % OPHTHALMIC SOLUTION    Place 1 drop into both eyes 6 (six) times daily.   TURMERIC (CURCUMIN 95) 500 MG CAPS    Take 1 capsule by mouth daily.  Modified Medications   No medications on file  Discontinued Medications   No medications on file

## 2017-02-28 NOTE — Patient Instructions (Signed)
Today we updated your med list in our EPIC system...    Continue your current medications the same...  We discussed increasing FIBER in your diet-- try Metamucil/ fiberall/ citrucel OTC daily...  Also add-in a probiotic like ALIGN or [facility] colon health (one of these capsules daily)...  We will arrange for a referral back to GI- DrJacobs for your 69yr colonoscopy due...  Call for any questions...  Let's plan a follow up visit in 93mo, sooner if needed for problems.Marland KitchenMarland Kitchen

## 2017-04-01 ENCOUNTER — Encounter: Payer: Self-pay | Admitting: Pulmonary Disease

## 2017-05-29 IMAGING — DX DG ABDOMEN 2V
2 series · 2 of 2 positions shown · non-contrast
Comparison: 12/07/2015

CLINICAL DATA: Abdominal pain, nausea

EXAM:
ABDOMEN - 2 VIEW

[w abdomen upright]
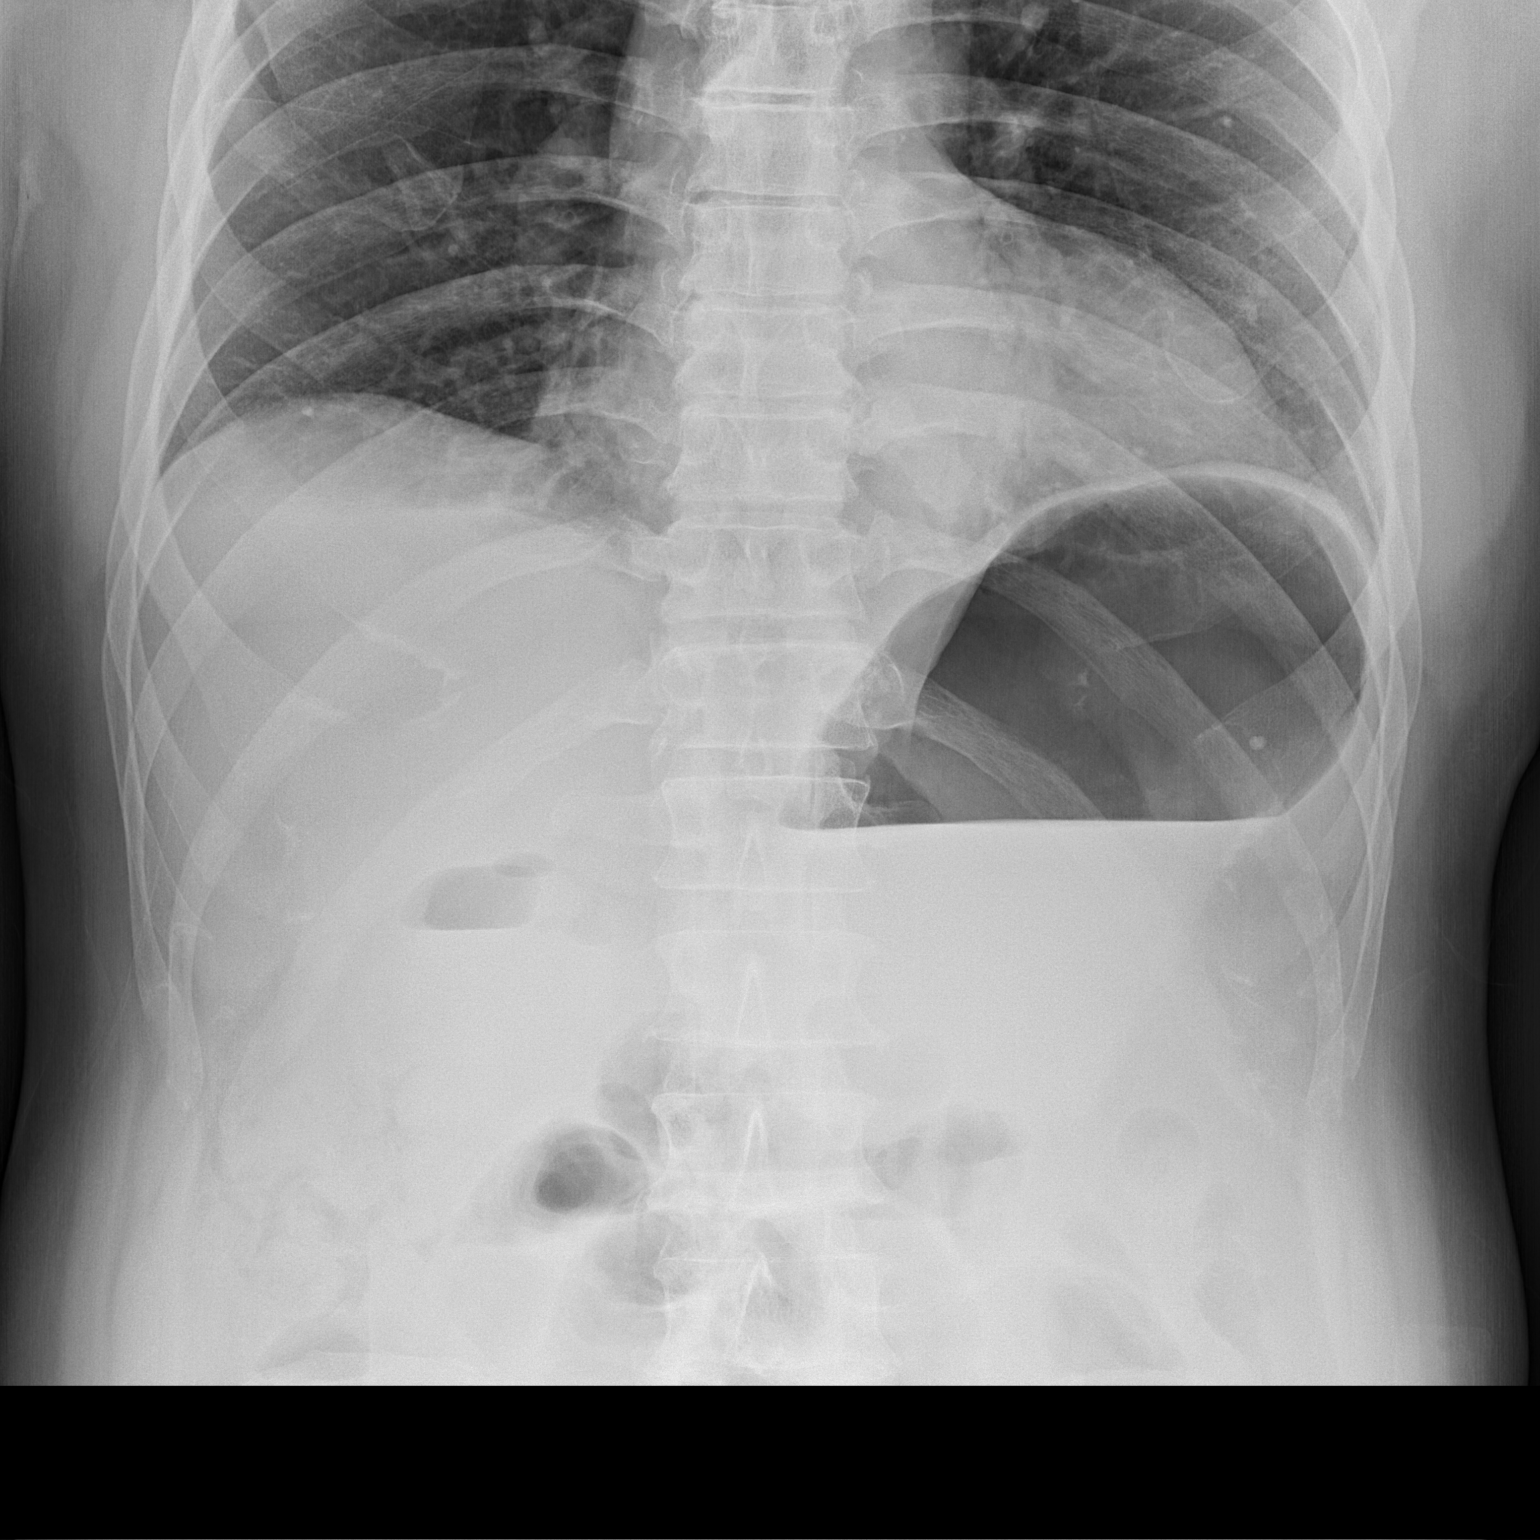

[t abdomen supine]
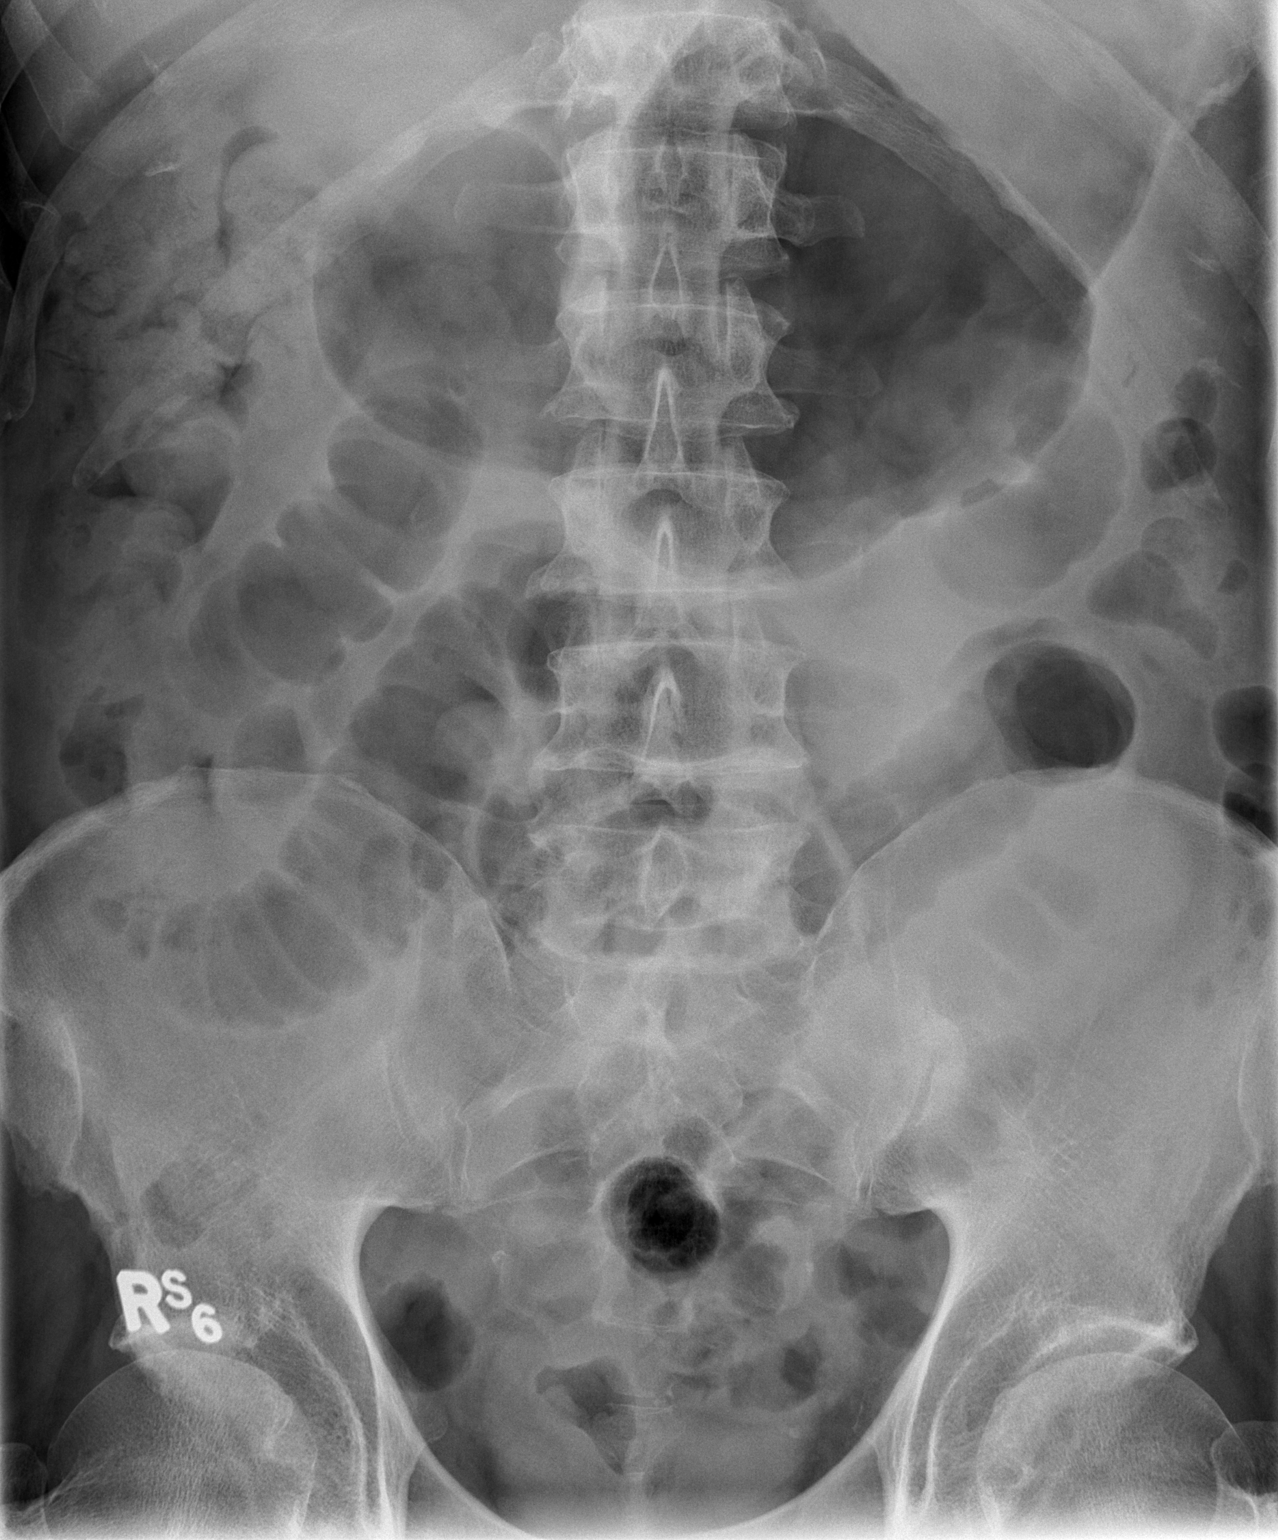

[2 of 2 positions shown; findings below may reference images not displayed]

FINDINGS: Persistent gaseous distended small bowel loops mid abdomen with
air-fluid levels consistent with small bowel obstruction. Moderate
gas and fluid noted within stomach.
IMPRESSION: Persistent gaseous distended small bowel loops mid abdomen with
air-fluid level consistent with small bowel obstruction.

## 2017-05-29 IMAGING — CR DG ABD PORTABLE 1V
1 series · 1 of 1 positions shown · non-contrast
Comparison: 12/08/2015

CLINICAL DATA: NG placement

EXAM:
PORTABLE ABDOMEN - 1 VIEW

[AP]
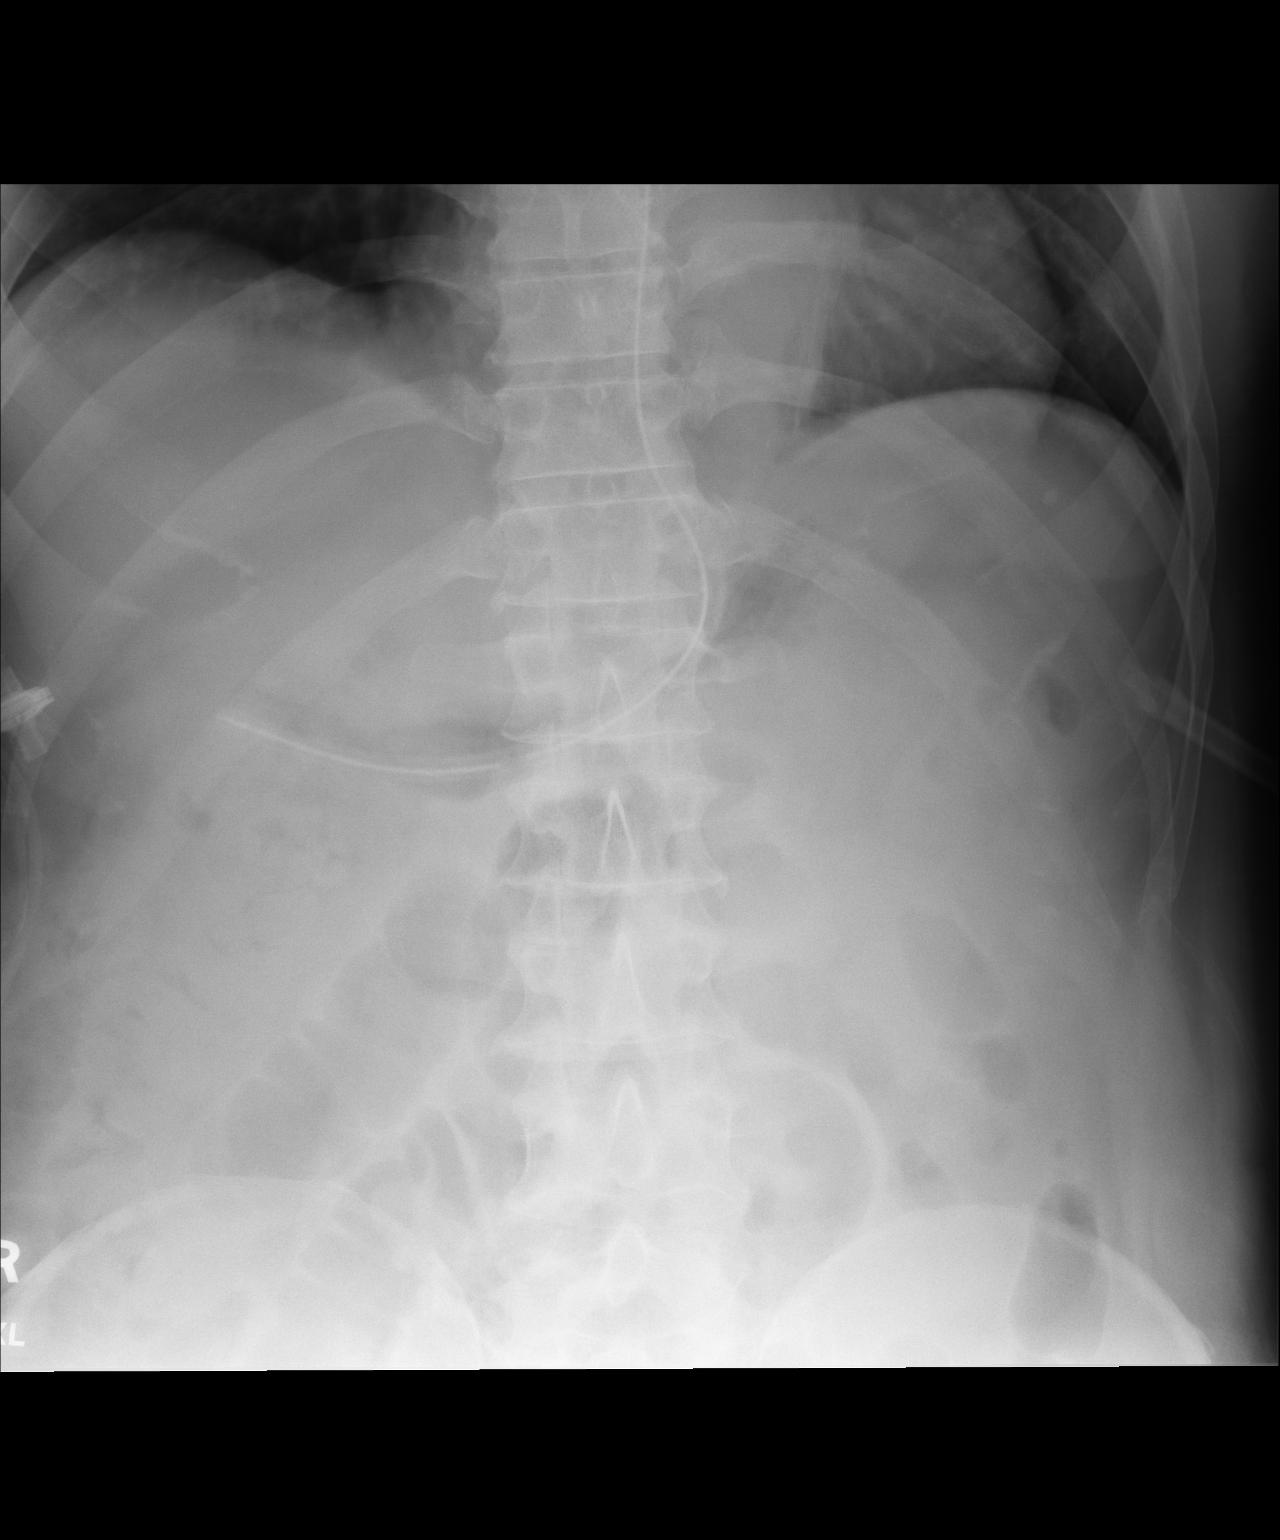

[1 of 1 positions shown; findings below may reference images not displayed]

FINDINGS: NG tube passes through the stomach with the tip near the pylorus.
Interval decompression of the stomach. Dilated small bowel loops
somewhat improved from earlier today.
IMPRESSION: Satisfactory NG tube placement with decompression of the stomach.
Improvement in small bowel dilatation.

## 2017-05-31 IMAGING — CR DG CHEST 1V PORT
2 series · 2 of 2 positions shown · non-contrast
Comparison: 11/16/2015 chest radiograph.

CLINICAL DATA: Fever

EXAM:
PORTABLE CHEST 1 VIEW

[AP (1 of 2)]
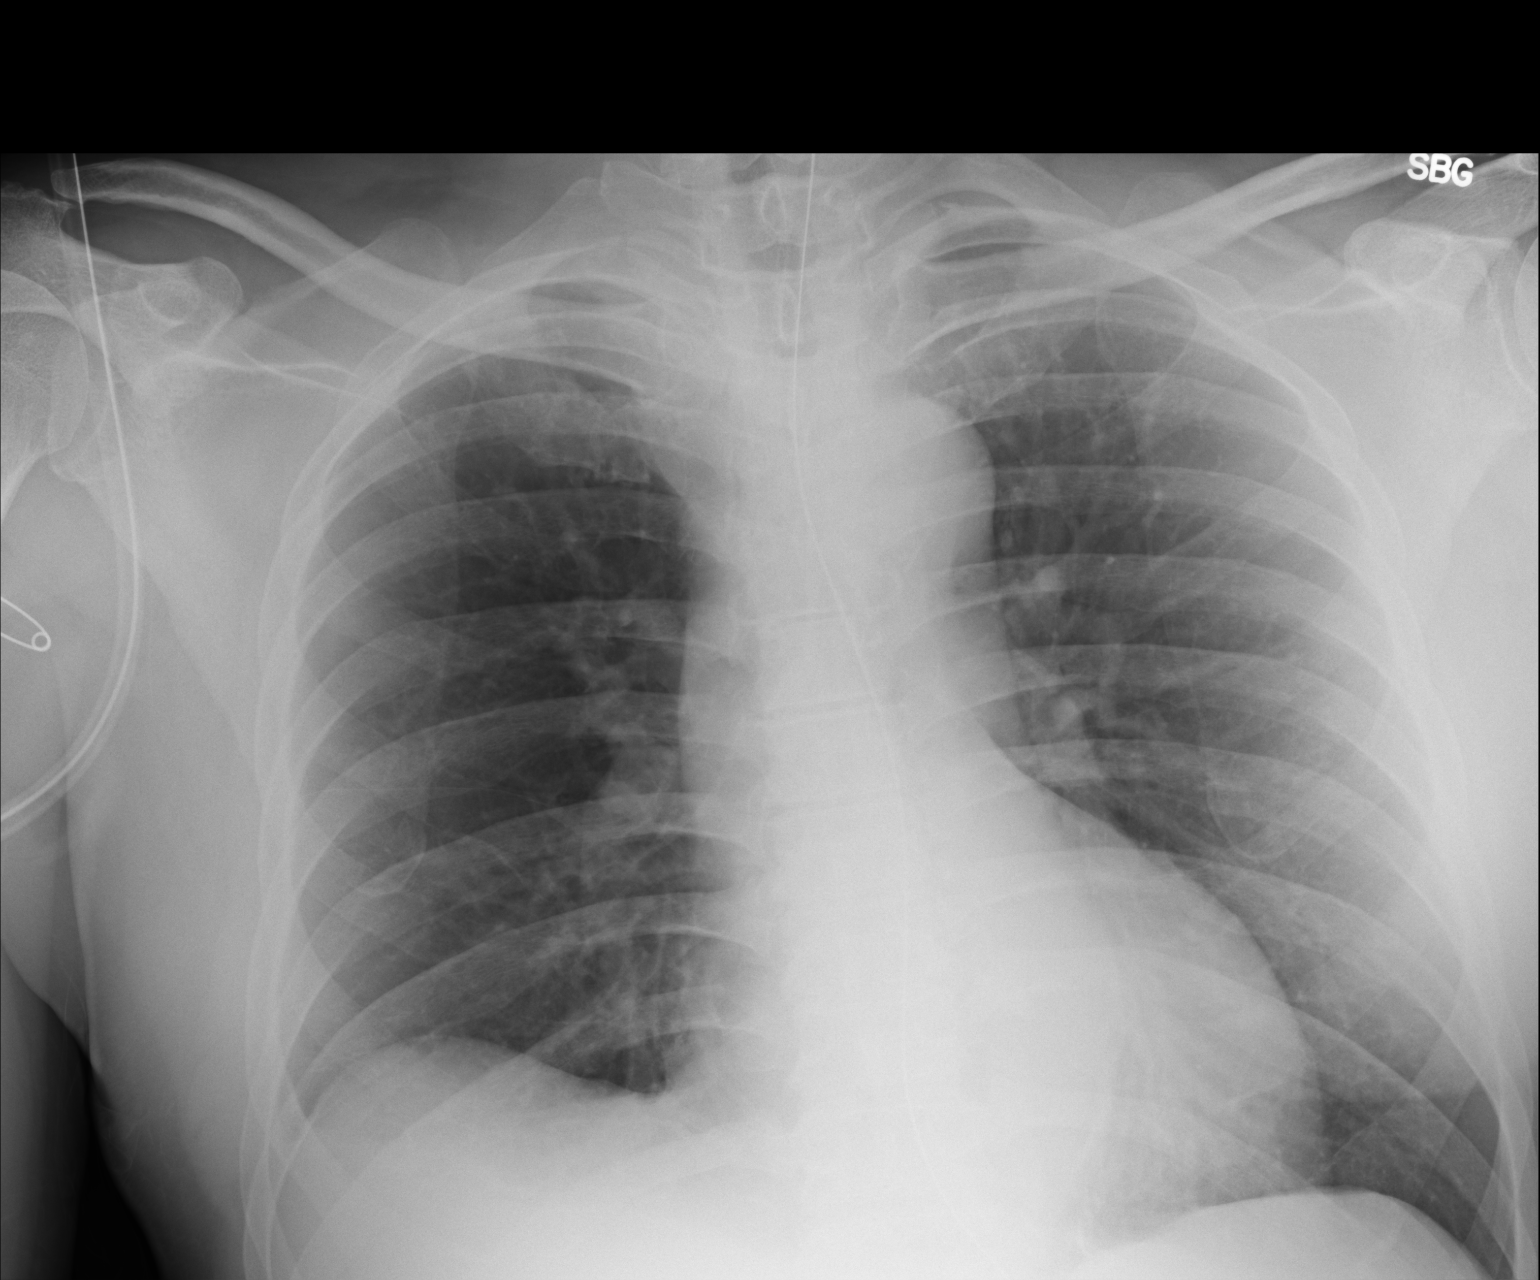

[AP (2 of 2)]
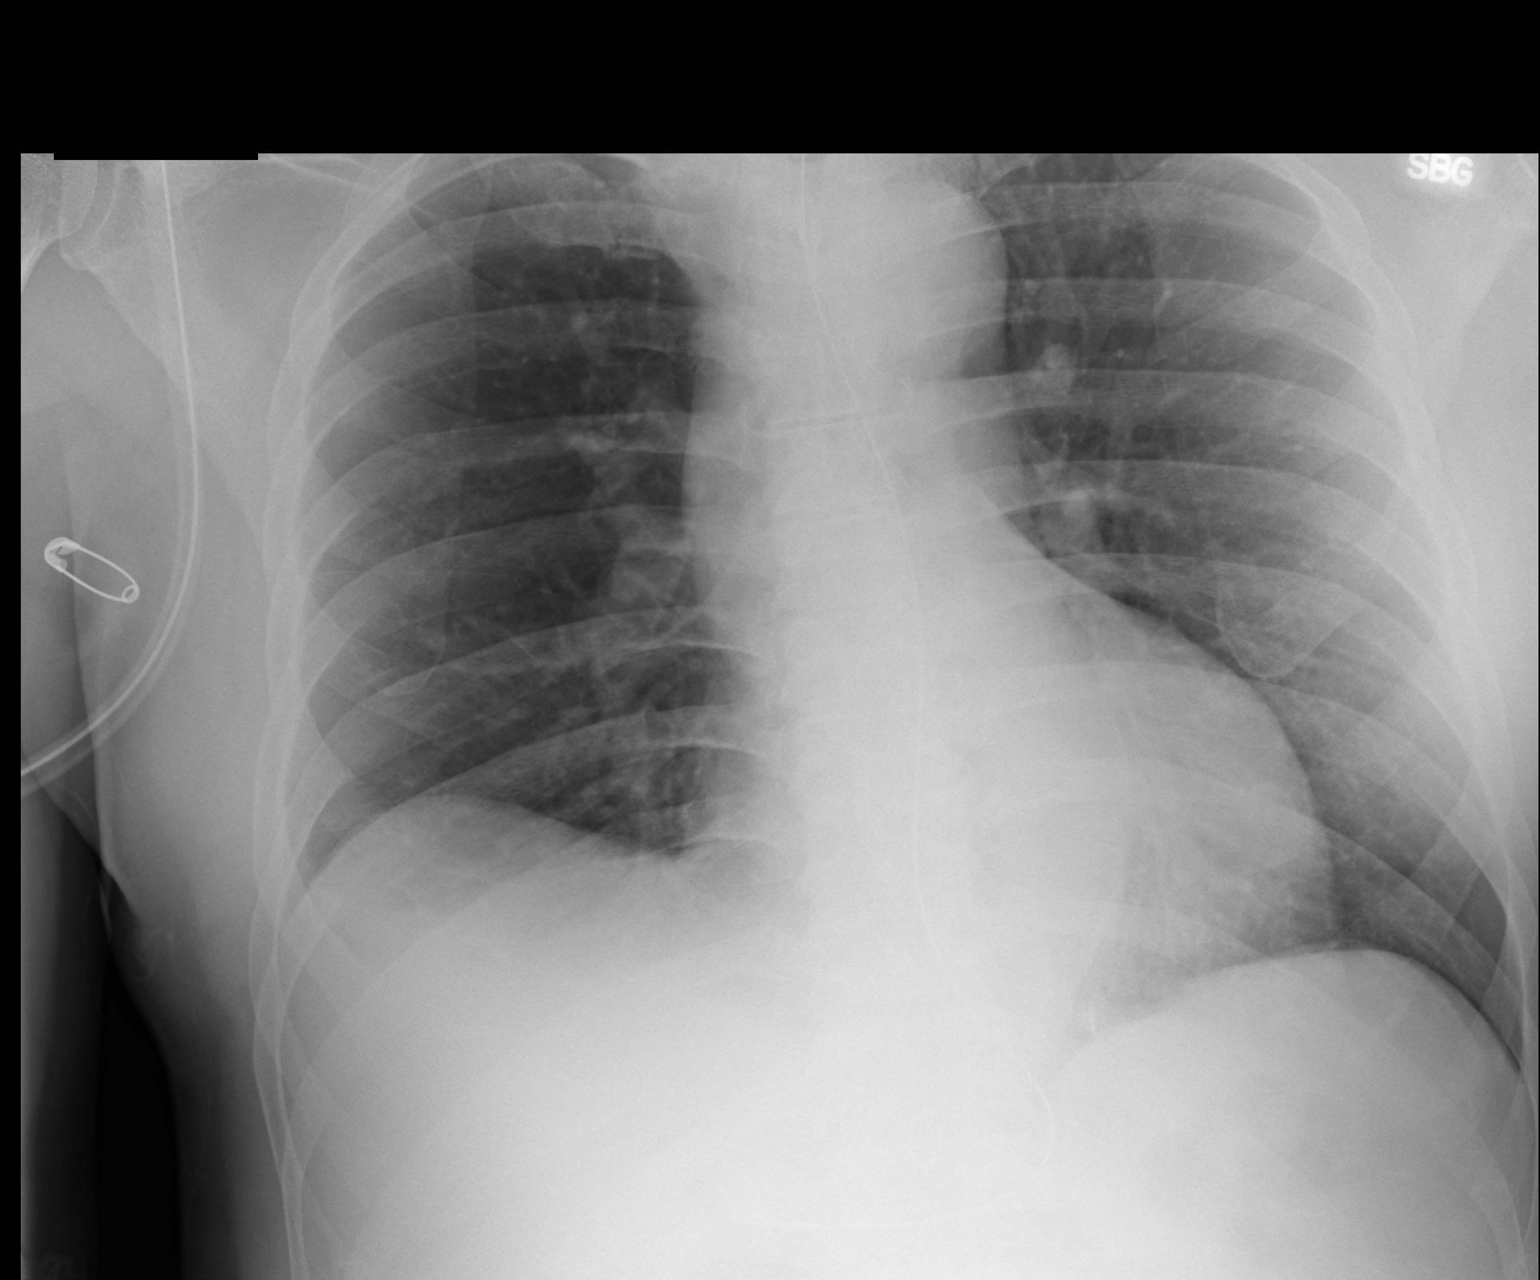

[2 of 2 positions shown; findings below may reference images not displayed]

FINDINGS: Enteric tube terminates in the distal body of the stomach. Stable
cardiomediastinal silhouette with normal heart size. No
pneumothorax. No pleural effusion. Lungs appear clear, with no acute
consolidative airspace disease and no pulmonary edema.
IMPRESSION: 1. Enteric tube terminates in the body of the stomach .
2. No active cardiopulmonary disease.

## 2017-08-29 ENCOUNTER — Encounter: Payer: Self-pay | Admitting: Pulmonary Disease

## 2017-08-29 ENCOUNTER — Ambulatory Visit (INDEPENDENT_AMBULATORY_CARE_PROVIDER_SITE_OTHER): Payer: Medicare Other | Admitting: Pulmonary Disease

## 2017-08-29 ENCOUNTER — Other Ambulatory Visit (INDEPENDENT_AMBULATORY_CARE_PROVIDER_SITE_OTHER): Payer: Medicare Other

## 2017-08-29 VITALS — BP 142/80 | HR 88 | Temp 97.9°F | Ht 74.5 in | Wt 229.8 lb

## 2017-08-29 DIAGNOSIS — D573 Sickle-cell trait: Secondary | ICD-10-CM

## 2017-08-29 DIAGNOSIS — Z0000 Encounter for general adult medical examination without abnormal findings: Secondary | ICD-10-CM

## 2017-08-29 DIAGNOSIS — I493 Ventricular premature depolarization: Secondary | ICD-10-CM

## 2017-08-29 DIAGNOSIS — R609 Edema, unspecified: Secondary | ICD-10-CM | POA: Diagnosis not present

## 2017-08-29 DIAGNOSIS — E7800 Pure hypercholesterolemia, unspecified: Secondary | ICD-10-CM | POA: Diagnosis not present

## 2017-08-29 DIAGNOSIS — D126 Benign neoplasm of colon, unspecified: Secondary | ICD-10-CM

## 2017-08-29 DIAGNOSIS — C61 Malignant neoplasm of prostate: Secondary | ICD-10-CM

## 2017-08-29 DIAGNOSIS — Z8719 Personal history of other diseases of the digestive system: Secondary | ICD-10-CM

## 2017-08-29 DIAGNOSIS — I10 Essential (primary) hypertension: Secondary | ICD-10-CM

## 2017-08-29 DIAGNOSIS — K589 Irritable bowel syndrome without diarrhea: Secondary | ICD-10-CM

## 2017-08-29 DIAGNOSIS — N139 Obstructive and reflux uropathy, unspecified: Secondary | ICD-10-CM

## 2017-08-29 DIAGNOSIS — F411 Generalized anxiety disorder: Secondary | ICD-10-CM

## 2017-08-29 DIAGNOSIS — I872 Venous insufficiency (chronic) (peripheral): Secondary | ICD-10-CM

## 2017-08-29 LAB — CBC WITH DIFFERENTIAL/PLATELET
BASOS ABS: 0 10*3/uL (ref 0.0–0.1)
Basophils Relative: 0.8 % (ref 0.0–3.0)
Eosinophils Absolute: 0.1 10*3/uL (ref 0.0–0.7)
Eosinophils Relative: 2.2 % (ref 0.0–5.0)
HEMATOCRIT: 44.3 % (ref 39.0–52.0)
Hemoglobin: 14.8 g/dL (ref 13.0–17.0)
LYMPHS PCT: 38.2 % (ref 12.0–46.0)
Lymphs Abs: 1.9 10*3/uL (ref 0.7–4.0)
MCHC: 33.4 g/dL (ref 30.0–36.0)
MCV: 80.6 fl (ref 78.0–100.0)
MONOS PCT: 7.6 % (ref 3.0–12.0)
Monocytes Absolute: 0.4 10*3/uL (ref 0.1–1.0)
Neutro Abs: 2.5 10*3/uL (ref 1.4–7.7)
Neutrophils Relative %: 51.2 % (ref 43.0–77.0)
Platelets: 114 10*3/uL — ABNORMAL LOW (ref 150.0–400.0)
RBC: 5.49 Mil/uL (ref 4.22–5.81)
RDW: 16.1 % — ABNORMAL HIGH (ref 11.5–15.5)
WBC: 4.9 10*3/uL (ref 4.0–10.5)

## 2017-08-29 LAB — COMPREHENSIVE METABOLIC PANEL
ALK PHOS: 65 U/L (ref 39–117)
ALT: 14 U/L (ref 0–53)
AST: 17 U/L (ref 0–37)
Albumin: 4.1 g/dL (ref 3.5–5.2)
BILIRUBIN TOTAL: 0.5 mg/dL (ref 0.2–1.2)
BUN: 25 mg/dL — ABNORMAL HIGH (ref 6–23)
CALCIUM: 9.5 mg/dL (ref 8.4–10.5)
CO2: 33 mEq/L — ABNORMAL HIGH (ref 19–32)
Chloride: 106 mEq/L (ref 96–112)
Creatinine, Ser: 1.52 mg/dL — ABNORMAL HIGH (ref 0.40–1.50)
GFR: 57.85 mL/min — ABNORMAL LOW (ref >60.00)
GLUCOSE: 94 mg/dL (ref 70–99)
POTASSIUM: 4 meq/L (ref 3.5–5.1)
Sodium: 142 mEq/L (ref 135–145)
TOTAL PROTEIN: 7 g/dL (ref 6.0–8.3)

## 2017-08-29 LAB — LIPID PANEL
Cholesterol: 180 mg/dL (ref 0–200)
HDL: 49.4 mg/dL (ref >39.00)
LDL Cholesterol: 119 mg/dL — ABNORMAL HIGH (ref 0–99)
NONHDL: 130.16
TRIGLYCERIDES: 54 mg/dL (ref 0.0–149.0)
Total CHOL/HDL Ratio: 4
VLDL: 10.8 mg/dL (ref 0.0–40.0)

## 2017-08-29 LAB — TSH: TSH: 0.87 u[IU]/mL (ref 0.35–4.50)

## 2017-08-29 NOTE — Patient Instructions (Signed)
Today we updated your med list in our EPIC system...    Continue your current medications the same...  Keep up the good work w/ DIET & EXERCISE!!!  Today we did a follow upCXR... Please return to our lab one morning soon for your FASTING blood work...    We will contact you w/ the results when available...   Remember to take about 2000u Vit D daily supplement...  We will contact GI- DrJacobs team to arrange for your follow up colonoscopy...  Call for any questions or if we can be of service in any way.Marland KitchenMarland Kitchen

## 2017-09-01 NOTE — Progress Notes (Addendum)
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 for Sleep Study but then wife states that they adjusted sleep position w/ pillows and now the snoring & apneas are GONE- wakes refreshed most days & denies daytime sleepiness issues unless resting & watching TV; he continues to decline sleep study...    Glaucoma> on eye drops from [employer]...    HBP> on Coreg12.5Bid & Benicar40; BP= 142/80 & he denies CP, palpit, SOB, or current edema; REC to elim sodium, get wt down, elev legs, TEDs & monitor BP at home on these 2 meds regularly...    Hx PVCs & AbnEKG> hx PVCs, last EKG 10/17 w/ LAD, poor R prog V1-2; on ASA81mg /d; denies CP, palpit, dizzy, SOB, he has no sensation of skips or racing;; Cardiac eval 7/16 by DrHilty- note reviewed, OK'd for surg...    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    Chol> they refuse meds, on diet alone, FLP done 7/19 showed TChol 180, TG 54, HDL 49, LDL 119- he is content w/ these numbers & doesn't want meds...    GI> Hx adenomatous colon polyps, IBS, [name] requiring ELap 11/2015 (DrTseui)> last colon 5/12 w/ 2 polyps removed (one adenoma), no recent symptoms, f/u colonoscopy is due & we will refer to DrJacobs...    GU> known prostate cancer (since 2003) & BPH w/ LTOS followed by DrBorden; s/p TURP 2/14 & voiding is improved; Urology notes reviewed> surveillance Bx 8/16 w/ stable Gleason 3+3=6 dis & PSA=13.3- very slowly  rising, remains on active surveillance protocol & I explained this to him again...    Renal Insuffic> Creat=  1.4-1.6 currently (his range over the last few yrs has been 1.3-1.8)... Labs 7/18 showed Cr=1.45, and 7/19 Cr=1.54    VitD defic>  Labs 08/2016 showed VitD level = 24 & rec to take 2000u daily...     Anxiety> He has a generalized anxiety disorder, & exac by substitute teaching stress; he declines anxiolytic rx...    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 EXAM shows Afeb, VSS, O2sat=99% on RA;  Wt-225#, 6'2"Tall, BMI=30;  HEENT- neg, mallampati2;  Chest- clear w/o w/r/r;  Heart- irreg w/ trigeminy, gr1/6SEM w/o r/g;  Abd- soft, nontender, neg;  Ext- neg w/o c/c/e;  Neuro- intact.  CXR 08/29/17>  He did not go to the [facility] for this film...  LABS 08/29/17>  FLP- improved on diet alone, LDL=119;  Chems- ok x Cr=1.52, LFTs wnl;  CBC- wnl;  TSH=0.87 IMP/PLAN>>  Fabion remains clinically stable- we reviewed diet & exercise, rec to incr fluid intake, continue same meds;  Continue his same BP meds; He needs f/u w/ Urology- DrBorden for his prostate cancer (and the sl elev Creat) & GI- DrJacobs for colonoscopy due; again asked to re-assess his refusal of vaccinations...   Note:  >50% of this 30 min rov was spent in counseling 7 coordination of care...  ADDENDUM>> CXR 09/03/17> Heart is borderline size, tortuous Ao, clear lungs- NAD... Pt reminded to take meds regularly every day, no salt, diet/ exercise/ get weight down...          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   HYPERTENSION (ICD-401.9) - he started on DOXAZOSIN 8mg /d 11/11 for elev BP & prostate symptoms;  He was intol to Norvasc in the past c/o nightmares while on this med;   He prefers to control BP w/ herbs, diet, low sodium, etc... ~  3/11:  BP= 150/90 today & he denies HA, fatigue, visual changes, CP, palipit, dizziness, syncope, dyspnea, edema, etc... we discussed 2DEcho to eval for LVH ==> finall done 11/11 & was WNL, no LVH etc... ~  11/11:  BP up at 150/100 today & we discussed diet, no salt, & start DOXAZOSIN 8mg /d. ~  5/12:  BP= 160/90 & he admits to not taking the Doxazosin regularly, asked to do so (so we can assess response)... ~  11/12:  BP= 148/80 & reminded to take the Doxazosin regularly but they want to decr the dose to 1/2 tab daily due to mood changes & forgetfulness that they are sure is coming from this med. ~  7/13:  BP= 132/86 & he denies CP, palpit, dizzy, SOB, etc... ~  9/13:  BP= 160/90 w/ his obstructive uropathy & pain; on Cardura8mg /d- encouraged to take it regularly which he hasn't been doing... ~  7/14:  They called 3/14 w/ elev BP & they wanted to change off the Cardura; we switched to Losartan100 & BP improved- BP= 146/82 7 he remains asymptomatic w/o CP, palpit, dizzy, SOB, edema... ~  7/15:  on Losartan100; BP= 154/90 & he denies CP, palpit, dizzy, SOB, edema; they feel that all meds cause mood changes (they declined counseling) & now night sweats- rec to try the Losar100 Qam + diet & exerc. ~  CXR 7/15 showed norm heart size, clear  lungs, NAD... ~  1/16: BP treated w/ Losar100 monotherapy but he has gained wt to 232# (BMI~29) and not on diet or restricting sodium; advised to elim sodium, get wt down, take Losar100 daily (may need additional meds if BP remains elev  Hx of PREMATURE VENTRICULAR CONTRACTIONS (ICD-427.69), & DYSRHYTHMIA, CARDIAC NOS (ICD-427.9) - on ASA 81mg /d... Remote hx of PVC's w/ eval 1986 showing rare PVC's on holter monitor;  no cardiac symptoms; EKG w/ L axis, poor R prog V1-3, NAD.  Venous Insuffic & Edema >> He noted some LE swelling & we discussed the need for low sodium diet, elevation, support hose, & prev on  LASIX 20mg /d as needed...  HYPERCHOLESTEROLEMIA, MILD (ICD-272.0) - hx mild elevation of TChol & LDL in the past- he prefers to control w/ diet + exercise therapy and doesn't want statin med Rx...  ~  [place] 12/09 showed TChol 207, Tg 52, HDL 38, LDL 141... he prefers diet. ~  FLP 7/10 showed TChol 177, TG 50, HDL 40, LDL 127 ~  FLP 3/11 showed TChol 191, TG 64, HDL 47, LDL 131 ~  FLP 5/12 showed TChol 181, TG 40, HDL 47, LDL 126... He continues to refuse med rx. ~  FLP 7/13 on diet alone showed TChol 185, TG 47, HDL 48, LDL 128 ~  FLP 7/14 on diet alone showed TChol 177, TG 39, HDL 48, LDL 122 ~  FLP 7/15 on diet alone showed TChol 188, TG 54, HDL 44, LDL 133- he is content w/ these numbers & doesn't want meds.     Hx of IRRITABLE BOWEL SYNDROME (ICD-564.1) - he denies abd pain, N/V, D/C, or blood seen... CONSTIPATION >> several abd films have shown increased right colon stool burden & he is encouraged to take [place] regularly...  ADENOMATOUS COLONIC POLYP (ICD-211.3)  ~  Colonoscopy by [facility] 3/08 w/ 69mm polyp in asc colon, removed w/ path= tubular adenoma;  repeat colon due 77yrs. ~  7/10: he denies nausea, vomiting, heartburn, diarrhea, constipation, blood in stool, abdominal pain, swelling, gas... ~  5/12: pt finally had his f/u colonoscopy> 2 polyps- one tubular adenoma w/ f/u suggested 38yrs...  Hx of HEMORRHOIDS (ICD-455.6) - Hx hemorroid surg by DrGerkin 10/03     PROSTATE CANCER (ICD-185) - Dx w/ Prostate Ca 1/03 by DrPeterson & offered surg vs seeds;  second opinion w/ DrDalstedt 2/03 w/ long discussion re: options;  discussed seed Rx w/ DrGoodchild 3/03;  pt preferred herbal Rx- and took [place];  4th opinion w/ Dr Lottie Rater @ [facility] 8/04 w/ repeat bx which was neg & decision made for "watchful waiting" w/ q88mo follow up in [facility]- last seen 9/09 and PSA was 10.3.Marland Kitchen. ~  7/10: pt informs me that he is no longer going to [facility] and wants a Orthoptist... refer to DrBorden at [facility]... we did his PSA= 13.85... ~  3/11:  pt still on watchful waiting protocol per his decision... PSA today = 18.36... refer back to DrBorden> note indicates that he wants to repeat bx & pt agreed> Bx done 6/11 & DrBorden's notes indicates it was pos for low vol Gleason 6 prostate cancer & pt chose to continue surveillance; pt indicated "there was no cancer there" but we discussed the diagnosis... ~  1/12:  f/u note from DrBorden reviewed> continues on watchful waiting, tried Finasteride for BPH symptoms... ~  8/12:  f/u note from DrBorden reviewed> pt declined incr meds either incr dose  of doxazosin or adding Finasteride, continue watchful waiting... ~  2/13:  f/u note from DrBorden reviewed> prostate ca, BPH/ LUTS, ED> PSA was 15.28 & pt wanted to continue surveillance... ~  7-9/13: 2 ER visits & f/u DrBorden> UTI w/ EColi & elev PSA- treated w/ Sulfa, developed obstructive uropathy as noted above==> TURP (see Sept 4, 2013 above)... ~  2/14:  He had TURP by DrBorden & voiding is better since then; Bx was pos for Gleason 3+3=6 adenocarcinoma & he prefers surveillance & holistic therapies... ~  He had f/u visit DrBorden 4/15> known prostate cancer & BPH w/ LTOS; s/p TURP 2/14 & voiding is improved; PSA stable ~12, continues on watchful waiting... ~  1/16: Hx obstructive uropathy- BPH, prostate cancer, renal insuffic; s/p TURP 2/14 by DrBorden, they continue to monitor his PSA on watchful waiting protocol.   Hx of GYNECOMASTIA (ICD-611.1) - Eval for L Gynecomastia 5/01 in [place]; FNA bx was neg... he has been taking an "immune enhancer" from [employer] [employer] in [place], [place] (pt asked to bring bottle in for [place] to review).     ANXIETY (ICD-300.00) - [place]'s underlying anxiety disorder is exac by substitute teaching now...  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 ~  Labs 3/11 showed Hg= 14.0, MCV= 81, Plat= 147K ~  Labs 5/12 showed Hg= 14.6, MCV= 82, Plat= 131K ~  Labs 7/13 showed Hg= 13.8, MCV= 80, Plat= 118K ~  Labs 7/14 showed Hg= 13.6, Plat= 123K ~  Labs 7/15 showed Hg= 14.0, Plat= 136K...  Health Maint:  pt still taking supplemental "Immune Enhancer" - he states "I can tell a difference, I have more energy". He refuses all adult vaccinations...   Past Surgical History:  Procedure Laterality Date  . ARM SURGERY  AGE 15   patient denies at preop appt of 05/20/2014    . COLONOSCOPY W/ BIOPSIES AND POLYPECTOMY  ~ 2002  . CYSTOSCOPY  01/03/2012   Procedure: CYSTOSCOPY FLEXIBLE;  Surgeon: Dutch Gray, MD;  Location: [facility];  Service: Urology;  Laterality: N/A;  . CYSTOSCOPY N/A 03/27/2012   Procedure: CYSTOSCOPY;  Surgeon: Dutch Gray, MD;  Location: [facility];  Service: Urology;  Laterality: N/A;  . CYSTOSCOPY N/A 10/04/2014   Procedure: CYSTOSCOPY FLEXIBLE;  Surgeon: Raynelle Bring, MD;  Location: [facility];  Service: Urology;  Laterality: N/A;  . LAPAROTOMY N/A 12/10/2015   Procedure: EXPLORATORY LAPAROTOMY FOR [name];  Surgeon: Donnie Mesa, MD;  Location: [facility];  Service: General;  Laterality: N/A;  . PROSTATE BIOPSY  02/2001   repeat in 11/2002 Dr. Lottie Rater at [facility]  . PROSTATE BIOPSY  01/03/2012   Procedure: BIOPSY TRANSRECTAL ULTRASONIC PROSTATE (TUBP);  Surgeon: Dutch Gray, MD;  Location: [facility];  Service: Urology;  Laterality: N/A;  45 MIN   . PROSTATE BIOPSY N/A 10/04/2014   Procedure: BIOPSY TRANSRECTAL ULTRASONIC PROSTATE (TUBP);  Surgeon: Raynelle Bring, MD;  Location: [facility];  Service: Urology;  Laterality: N/A;  . REFRACTIVE SURGERY     for glaucoma   . TRANSURETHRAL RESECTION OF PROSTATE N/A 03/27/2012   Procedure: TRANSURETHRAL RESECTION OF THE PROSTATE WITH GYRUS INSTRUMENTS;  Surgeon: Dutch Gray, MD;  Location: [facility];  Service: Urology;  Laterality: N/A;    Outpatient Encounter Medications as of 08/29/2017  Medication Sig  . bimatoprost (LUMIGAN) 0.01 % SOLN Place 1 drop into both eyes at bedtime.  . brimonidine (ALPHAGAN) 0.15 % ophthalmic solution Place 1 drop into both eyes 2 (two) times daily.   . carvedilol (COREG) 12.5 MG tablet Take 1 tablet (12.5 mg total) by mouth 2 (two) times daily with a meal.  . Multiple Vitamin (MULTIVITAMIN) tablet Take 1 tablet by mouth daily.  Marland Kitchen olmesartan (BENICAR) 40 MG tablet Take 1 tablet (40 mg total) daily by mouth.  . Turmeric (CURCUMIN 95) 500 MG CAPS Take 1 capsule by mouth daily.  . [DISCONTINUED] polyvinyl alcohol (LIQUIFILM TEARS) 1.4 % ophthalmic solution Place 1 drop into both eyes 6 (six) times daily.   No facility-administered encounter medications on file as of 08/29/2017.     Allergies  Allergen Reactions  . Amlodipine Besylate Other (See Comments)    REACTION: causes nightmares  . Tamsulosin Other (See Comments)     dizziness    Immunization History  Administered Date(s) Administered  . PPD Test 04/24/2011  . Tdap 09/08/2013  Pt has repeatedly refused FLU shots and the PNEUMONIA vaccines...    Current Medications, Allergies, Past Medical History, Past Surgical History, Family History, and Social History were reviewed in [employer] record.   Review of Systems        See HPI - all other systems neg except as noted... The patient denies anorexia, fever, weight loss, weight gain, vision loss, decreased hearing, hoarseness, chest pain, syncope, dyspnea on exertion, peripheral edema, prolonged cough, headaches, hemoptysis, abdominal pain, melena, hematochezia, severe indigestion/heartburn, hematuria, incontinence, muscle weakness, suspicious skin lesions, transient blindness, difficulty walking, depression, unusual weight change, abnormal bleeding, enlarged lymph nodes, and angioedema.     Objective:   Physical Exam      WD, Overweight, 77 y/o BM in NAD... GENERAL:  Alert & oriented; pleasant & cooperative... HEENT:  [place]/AT, EOM-wnl, PERRLA, EACs-clear, TMs-wnl, NOSE-clear, THROAT-clear & wnl. NECK:  Supple w/ fairROM; no JVD; normal carotid impulses w/o bruits; no thyromegaly or nodules palpated; no lymphadenopathy. CHEST:  Clear to P & A; without wheezes/ rales/ or rhonchi. HEART:  Regular Rhythm; without murmurs/ rubs/ or gallops. ABDOMEN:  Soft & nontender; normal bowel sounds; no organomegaly or masses detected. Rectal exam is deferred... EXT: without deformities, mild arthritic changes noted; no varicose veins/ +venous insuffic= 1+ & tr edema. NEURO:  CN's intact; motor testing normal; sensory testing normal; gait normal & balance OK. DERM:  No lesions noted; no rash etc...  RADIOLOGY DATA:  Reviewed in the EPIC EMR & discussed w/ the patient...  LABORATORY DATA:  Reviewed in the EPIC EMR & discussed w/ the patient...   Assessment & Plan:    08/28/16>   [place] is rec to restart the COREG 12.5mg  Bid;  He does not want Chol med, therefore diet rx alone;  rec to start VitD 2000u daily;  Prostate f/u every 59mo by DrBorden;  He's under a lot of stress w/ wife's colon cancer. 02/28/17>   [place] has been going thru a difficult time w/ the passing of his wife in Dec;  rec to incr fiber, take align, & we will refer to GI- DrJacobs for f/u colonoscopy overdue; pt still refuses all vaccinations;  He will maintain f/u w/ GU- DrBorden for his prostate cancer and return to see [place] in 45mo, sooner if needed prn. 08/29/17>   Creg remains clinically stable- we reviewed diet & exercise, rec to incr fluid intake, continue same meds;  Continue his same BP meds; He needs f/u w/ Urology- DrBorden for his prostate cancer (and the sl elev Creat) & GI- DrJacobs for colonoscopy due; again asked to re-assess his refusal of vaccination   Ven Insuffic w/ edema>  We reviewed plans for low sodium, elev  legs, wear support hose,  Decided to ADD LASIX20mg /d & watch BP & renal function...  HBP>  BP is fair on Coreg12.5Bid + Cozaar100, he will monitor BP at home...  CHOL>  Remains on diet alone w/ fair numbers, he refuses med Rx...  GI>  IBS, Constipation, Polyps, hx Hems>  Followed by DrJacobs- s/p colonoscopy w/ removal of adenomaous polyp 5/12 & repeat suggested 56yrs; he has chr constip vs IBS-C and rec to take regular laxative meds w/ MIRALAX & SENAKOT-S (if not effective, may need Amitiza vs Linzess). 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  Son has AS hemoglobin>  Pt wants test for sickle trait & we will order a hemoglobin electrophoresis at his request (test wasn't carried out by the lab)...   Patient's Medications  New Prescriptions   No medications on file  Previous Medications   BIMATOPROST (LUMIGAN) 0.01 % SOLN    Place 1 drop into both eyes at bedtime.   BRIMONIDINE (ALPHAGAN) 0.15 % OPHTHALMIC SOLUTION    Place 1 drop into both eyes 2 (two) times daily.    CARVEDILOL (COREG) 12.5 MG TABLET    Take 1 tablet (12.5 mg total) by mouth 2 (two) times daily with a meal.   MULTIPLE VITAMIN (MULTIVITAMIN) TABLET    Take 1 tablet by mouth daily.   OLMESARTAN (BENICAR) 40 MG TABLET    Take 1 tablet (40 mg total) daily by mouth.   TURMERIC (CURCUMIN 95) 500 MG CAPS    Take 1 capsule by mouth daily.  Modified Medications   No medications on file  Discontinued Medications   POLYVINYL ALCOHOL (LIQUIFILM TEARS) 1.4 % OPHTHALMIC SOLUTION    Place 1 drop into both eyes 6 (six) times daily.

## 2017-09-02 ENCOUNTER — Telehealth: Payer: Self-pay | Admitting: Pulmonary Disease

## 2017-09-02 NOTE — Telephone Encounter (Signed)
During 08/29/17 OV, pt was instructed to have CXR.  Pt stated he forgot to have CXR after OV, however he will come by our office this week to have CXR.   Will route to SN and Lattie Haw to make aware.  Nothing further is needed at this time.

## 2017-09-03 ENCOUNTER — Ambulatory Visit (INDEPENDENT_AMBULATORY_CARE_PROVIDER_SITE_OTHER)
Admission: RE | Admit: 2017-09-03 | Discharge: 2017-09-03 | Disposition: A | Discharge status: Home / Self Care | Payer: Medicare Other | Source: Ambulatory Visit | Attending: Pulmonary Disease | Admitting: Pulmonary Disease

## 2017-09-03 DIAGNOSIS — Z8719 Personal history of other diseases of the digestive system: Secondary | ICD-10-CM | POA: Diagnosis not present

## 2017-09-05 NOTE — Telephone Encounter (Signed)
CXR completed 09/03/17.

## 2017-09-17 ENCOUNTER — Telehealth: Payer: Self-pay | Admitting: Pulmonary Disease

## 2017-09-17 NOTE — Telephone Encounter (Signed)
Notes recorded by Noralee Space, MD on 09/17/2017 at 10:47 AM EDT Please notify patient> Sorry for the delay in report, I was [place] CXR 7/23 shows borderline heart size, tortuous Ao, clear lungs- NAD... REC: Important to keep BP well contolled, take [place] regularly as prescribed, no salt/ DIET/ Exercise/ get weight down!   LMTCB

## 2017-09-18 NOTE — Telephone Encounter (Signed)
Attempted to call patient today regarding results. I did not receive an answer at time of call. I have left a voicemail message for pt to return call. X1  

## 2017-09-18 NOTE — Telephone Encounter (Signed)
Called and spoke with patient regarding results.  Informed the patient of results and recommendations today. Pt verbalized understanding and denied any questions or concerns at this time.  Nothing further needed.  

## 2017-09-18 NOTE — Telephone Encounter (Signed)
Attempted to call patient today regarding results. I did not receive an answer at time of call. I have left a voicemail message for pt to return call. X2  

## 2017-09-18 NOTE — Telephone Encounter (Signed)
Pt is calling back [phone] or [phone]

## 2017-10-04 ENCOUNTER — Encounter: Payer: Self-pay | Admitting: Gastroenterology

## 2017-11-22 ENCOUNTER — Ambulatory Visit (AMBULATORY_SURGERY_CENTER): Payer: Self-pay | Admitting: *Deleted

## 2017-11-22 ENCOUNTER — Encounter: Payer: Self-pay | Admitting: Gastroenterology

## 2017-11-22 VITALS — Ht 74.5 in | Wt 226.0 lb

## 2017-11-22 DIAGNOSIS — Z86010 Personal history of colonic polyps: Secondary | ICD-10-CM

## 2017-11-22 MED ORDER — PEG 3350-KCL-NA BICARB-NACL 420 G PO SOLR: 4000.0000 mL | Freq: Once | ORAL | 0 refills | Status: AC

## 2017-11-22 NOTE — Progress Notes (Signed)
No egg or soy allergy known to patient  No issues with past sedation with any surgeries  or procedures, no intubation problems  No diet pills per patient No home 02 use per patient  No blood thinners per patient  Pt denies issues with constipation  No A fib or A flutter  EMMI video sent to pt's e mail  

## 2017-12-06 ENCOUNTER — Encounter: Payer: Medicare Other | Admitting: Gastroenterology

## 2018-01-13 ENCOUNTER — Encounter: Payer: Medicare Other | Admitting: Gastroenterology

## 2018-01-13 ENCOUNTER — Other Ambulatory Visit: Payer: Self-pay | Admitting: Pulmonary Disease

## 2018-01-13 DIAGNOSIS — I10 Essential (primary) hypertension: Secondary | ICD-10-CM

## 2018-04-01 ENCOUNTER — Telehealth: Payer: Self-pay | Admitting: Pulmonary Disease

## 2018-04-01 NOTE — Telephone Encounter (Signed)
Called and spoke with Patient.  He stated that he recently applied for new insurance and was turned down. He was told that he had cardiac issues.  Patient stated that he knew that he hypertension, and was on medications, but was unaware of anything further. He stated that the insurance company told him to contact his PCP.   He is aware that Dr Lenna Gilford is retired, but wanted to see if anything could be done or if someone could explain his medical condition. Patient requested recommendations for new PCP.  PCP list of [facility] providers accepting new Patients placed in out going mail for Patient per his request.  Message routed To Dr Lenna Gilford

## 2018-04-03 NOTE — Telephone Encounter (Signed)
John [name],   I reviewed his chart and the info that the insurance company gave him is incorrect.  He has HBP which is well controlled on meds.  He also has a history of PVCs and an abnormal EKG- he was seen by Cardiology [facility]) in 2016 for preoperative clearance (prostate biopsy by DrBorden) and was cleared for the procedure.  There is certainly NO EVIDENCE of "severe" heart issues here.     If they chose not to cover him (I guess he is talking about a Medicare advantage plan, supplement) I suspect that it is related to his known prostate cancer, mild renal insuffic w/ Cr=1.52, & GI issues w/ colon polyps and surg for [name] in 2017.  Their underwriting guidelines many indicate that this combination of problems is projected to be too expensive for them to cover in the coming years but that is just my guess.  He should talk to his insurance agent & look into a different Medicare supplement with a different carrier.   A letter to the insurance company will prob not help his situation, but if it would be helpful then I can do a letter when I get back in town next week.    Thanks Blain Pais

## 2019-05-05 ENCOUNTER — Encounter: Payer: Self-pay | Admitting: Gastroenterology

## 2019-06-09 ENCOUNTER — Ambulatory Visit (AMBULATORY_SURGERY_CENTER): Payer: Self-pay | Admitting: *Deleted

## 2019-06-09 ENCOUNTER — Other Ambulatory Visit: Payer: Self-pay

## 2019-06-09 VITALS — Temp 97.2°F | Ht 75.0 in | Wt 235.0 lb

## 2019-06-09 DIAGNOSIS — Z86010 Personal history of colonic polyps: Secondary | ICD-10-CM

## 2019-06-09 NOTE — Progress Notes (Signed)
No egg or soy allergy known to patient  No issues with past sedation with any surgeries  or procedures, no intubation problems  No diet pills per patient No home 02 use per patient  No blood thinners per patient  Pt denies issues with constipation  No A fib or A flutter  EMMI video sent to pt's e mail   [place] 05-07-2019  Due to the COVID-19 pandemic we are asking patients to follow these guidelines. Please only bring one care partner. Please be aware that your care partner may wait in the car in the parking lot or if they feel like they will be too hot to wait in the car, they may wait in the lobby on the 4th floor. All care partners are required to wear a mask the entire time (we do not have any that we can provide them), they need to practice social distancing, and we will do a Covid check for all patient's and care partners when you arrive. Also we will check their temperature and your temperature. If the care partner waits in their car they need to stay in the parking lot the entire time and we will call them on their cell phone when the patient is ready for discharge so they can bring the car to the front of the building. Also all patient's will need to wear a mask into building.

## 2019-06-23 ENCOUNTER — Encounter: Payer: Self-pay | Admitting: Gastroenterology

## 2019-06-23 ENCOUNTER — Ambulatory Visit (AMBULATORY_SURGERY_CENTER): Payer: Medicare Other | Admitting: Gastroenterology

## 2019-06-23 ENCOUNTER — Other Ambulatory Visit: Payer: Self-pay

## 2019-06-23 VITALS — BP 124/82 | HR 54 | Temp 97.5°F | Resp 15 | Ht 75.0 in | Wt 235.0 lb

## 2019-06-23 DIAGNOSIS — D124 Benign neoplasm of descending colon: Secondary | ICD-10-CM

## 2019-06-23 DIAGNOSIS — Z86010 Personal history of colonic polyps: Secondary | ICD-10-CM | POA: Diagnosis not present

## 2019-06-23 MED ORDER — SODIUM CHLORIDE 0.9 % IV SOLN: 500.0000 mL | Freq: Once | INTRAVENOUS | Status: DC

## 2019-06-23 NOTE — Progress Notes (Signed)
Called to room to assist during endoscopic procedure.  Patient ID and intended procedure confirmed with present staff. Received instructions for my participation in the procedure from the performing physician.  

## 2019-06-23 NOTE — Progress Notes (Signed)
Temp-LS VS-CW  Pt's states no medical or surgical changes since previsit or office visit.

## 2019-06-23 NOTE — Patient Instructions (Signed)
HANDOUTS PROVIDED ON: POLYPS  The polyp removed today have been sent for pathology.  The results can take 1-3 weeks to receive.  When your next colonoscopy should occur will be based on the pathology results.    You may resume your previous diet and medication schedule.  Thank you for allowing us to care for you today!!!   YOU HAD AN ENDOSCOPIC PROCEDURE TODAY AT THE [facility]:   Refer to the procedure report that was given to you for any specific questions about what was found during the examination.  If the procedure report does not answer your questions, please call your gastroenterologist to clarify.  If you requested that your care partner not be given the details of your procedure findings, then the procedure report has been included in a sealed envelope for you to review at your convenience later.  YOU SHOULD EXPECT: Some feelings of bloating in the abdomen. Passage of more gas than usual.  Walking can help get rid of the air that was put into your GI tract during the procedure and reduce the bloating. If you had a lower endoscopy (such as a colonoscopy or flexible sigmoidoscopy) you may notice spotting of blood in your stool or on the toilet paper. If you underwent a bowel prep for your procedure, you may not have a normal bowel movement for a few days.  Please Note:  You might notice some irritation and congestion in your nose or some drainage.  This is from the oxygen used during your procedure.  There is no need for concern and it should clear up in a day or so.  SYMPTOMS TO REPORT IMMEDIATELY:   Following lower endoscopy (colonoscopy or flexible sigmoidoscopy):  Excessive amounts of blood in the stool  Significant tenderness or worsening of abdominal pains  Swelling of the abdomen that is new, acute  Fever of 100F or higher  For urgent or emergent issues, a gastroenterologist can be reached at any hour by calling [phone]. Do not use MyChart messaging for  urgent concerns.    DIET:  We do recommend a small meal at first, but then you may proceed to your regular diet.  Drink plenty of fluids but you should avoid alcoholic beverages for 24 hours.  ACTIVITY:  You should plan to take it easy for the rest of today and you should NOT DRIVE or use heavy machinery until tomorrow (because of the sedation medicines used during the test).    FOLLOW UP: Our staff will call the number listed on your records 48-72 hours following your procedure to check on you and address any questions or concerns that you may have regarding the information given to you following your procedure. If we do not reach you, we will leave a message.  We will attempt to reach you two times.  During this call, we will ask if you have developed any symptoms of COVID 19. If you develop any symptoms (ie: fever, flu-like symptoms, shortness of breath, cough etc.) before then, please call [phone].  If you test positive for Covid 19 in the 2 weeks post procedure, please call and report this information to us.    If any biopsies were taken you will be contacted by phone or by letter within the next 1-3 weeks.  Please call us at [phone] if you have not heard about the biopsies in 3 weeks.    SIGNATURES/CONFIDENTIALITY: You and/or your care partner have signed paperwork which will be entered into   your electronic medical record.  These signatures attest to the fact that that the information above on your After Visit Summary has been reviewed and is understood.  Full responsibility of the confidentiality of this discharge information lies with you and/or your care-partner.

## 2019-06-23 NOTE — Progress Notes (Signed)
Report given to PACU, vss 

## 2019-06-23 NOTE — Op Note (Signed)
[facility] Patient Name: John [name] Procedure Date: 06/23/2019 10:39 AM MRN: [id number] Endoscopist: Milus Banister , MD Age: 79 Referring MD:  Date of Birth: 08/16/1940 Gender: Male Account #: [id number] Procedure:                Colonoscopy Indications:              High risk colon cancer surveillance: Personal                            history of colonic polyps; Colonoscopy 2008 33mm TA                            removed. Colonoscopy 2012 single subCM adenoma                            removed. Medicines:                Monitored Anesthesia Care Procedure:                Pre-Anesthesia Assessment:                           - Prior to the procedure, a History and Physical                            was performed, and patient medications and                            allergies were reviewed. The patient's tolerance of                            previous anesthesia was also reviewed. The risks                            and benefits of the procedure and the sedation                            options and risks were discussed with the patient.                            All questions were answered, and informed consent                            was obtained. Prior Anticoagulants: The patient has                            taken no previous anticoagulant or antiplatelet                            agents. ASA Grade Assessment: II - A patient with                            mild systemic disease. After reviewing the risks  and benefits, the patient was deemed in                            satisfactory condition to undergo the procedure.                           After obtaining informed consent, the colonoscope                            was passed under direct vision. Throughout the                            procedure, the patient's blood pressure, pulse, and                            oxygen saturations were monitored continuously. The                         Colonoscope was introduced through the anus and                            advanced to the the cecum, identified by                            appendiceal orifice and ileocecal valve. The                            colonoscopy was performed without difficulty. The                            patient tolerated the procedure well. The quality                            of the bowel preparation was good. The ileocecal                            valve, appendiceal orifice, and rectum were                            photographed. Scope In: 11:20:13 AM Scope Out: 11:29:38 AM Scope Withdrawal Time: 0 hours 6 minutes 28 seconds  Total Procedure Duration: 0 hours 9 minutes 25 seconds  Findings:                 A 4 mm polyp was found in the descending colon. The                            polyp was sessile. The polyp was removed with a                            cold snare. Resection and retrieval were complete.                           The exam was otherwise without abnormality on  direct and retroflexion views. Complications:            No immediate complications. Estimated blood loss:                            None. Estimated Blood Loss:     Estimated blood loss: none. Impression:               - One 4 mm polyp in the descending colon, removed                            with a cold snare. Resected and retrieved.                           - The examination was otherwise normal on direct                            and retroflexion views. Recommendation:           - Patient has a contact number available for                            emergencies. The signs and symptoms of potential                            delayed complications were discussed with the                            patient. Return to normal activities tomorrow.                            Written discharge instructions were provided to the                            patient.                            - Resume previous diet.                           - Continue present medications.                           - Await pathology results. Milus Banister, MD 06/23/2019 11:32:20 AM This report has been signed electronically.

## 2019-06-25 ENCOUNTER — Telehealth: Payer: Self-pay

## 2019-06-25 ENCOUNTER — Telehealth: Payer: Self-pay | Admitting: *Deleted

## 2019-06-25 NOTE — Telephone Encounter (Signed)
  Follow up Call-  Call back number 06/23/2019  Post procedure Call Back phone  # [phone]  Permission to leave phone message Yes  Some recent data might be hidden     Patient questions:  Message left to call us if necessary.  Second call.

## 2019-06-25 NOTE — Telephone Encounter (Signed)
1st follow up call made.  NALM 

## 2019-06-26 ENCOUNTER — Encounter: Payer: Self-pay | Admitting: Gastroenterology

## 2020-04-06 ENCOUNTER — Other Ambulatory Visit: Payer: Self-pay | Admitting: Urology

## 2020-04-06 DIAGNOSIS — C61 Malignant neoplasm of prostate: Secondary | ICD-10-CM

## 2020-04-20 ENCOUNTER — Inpatient Hospital Stay: Admission: RE | Admit: 2020-04-20 | Payer: Medicare Other | Source: Ambulatory Visit

## 2020-05-10 DIAGNOSIS — H25813 Combined forms of age-related cataract, bilateral: Secondary | ICD-10-CM | POA: Insufficient documentation

## 2020-05-10 DIAGNOSIS — H401113 Primary open-angle glaucoma, right eye, severe stage: Secondary | ICD-10-CM | POA: Insufficient documentation

## 2020-05-17 ENCOUNTER — Other Ambulatory Visit: Payer: Self-pay

## 2020-05-17 ENCOUNTER — Ambulatory Visit
Admission: RE | Admit: 2020-05-17 | Discharge: 2020-05-17 | Disposition: A | Discharge status: Home / Self Care | Payer: Medicare Other | Source: Ambulatory Visit | Attending: Urology | Admitting: Urology

## 2020-05-17 DIAGNOSIS — C61 Malignant neoplasm of prostate: Secondary | ICD-10-CM

## 2020-05-17 MED ORDER — GADOBENATE DIMEGLUMINE 529 MG/ML IV SOLN
20.0000 mL | Freq: Once | INTRAVENOUS | Status: AC | PRN
  Administered 2020-05-17: 20 mL via INTRAVENOUS

## 2020-09-26 DIAGNOSIS — H9190 Unspecified hearing loss, unspecified ear: Secondary | ICD-10-CM | POA: Insufficient documentation

## 2020-09-26 DIAGNOSIS — F411 Generalized anxiety disorder: Secondary | ICD-10-CM | POA: Insufficient documentation

## 2020-10-04 DIAGNOSIS — Z961 Presence of intraocular lens: Secondary | ICD-10-CM | POA: Insufficient documentation

## 2021-06-21 ENCOUNTER — Ambulatory Visit (INDEPENDENT_AMBULATORY_CARE_PROVIDER_SITE_OTHER): Payer: Medicare Other

## 2021-06-21 ENCOUNTER — Encounter: Payer: Self-pay | Admitting: Podiatry

## 2021-06-21 ENCOUNTER — Ambulatory Visit: Payer: Medicare Other | Admitting: Podiatry

## 2021-06-21 DIAGNOSIS — M21612 Bunion of left foot: Secondary | ICD-10-CM

## 2021-06-21 DIAGNOSIS — B351 Tinea unguium: Secondary | ICD-10-CM | POA: Diagnosis not present

## 2021-06-21 DIAGNOSIS — M21619 Bunion of unspecified foot: Secondary | ICD-10-CM | POA: Diagnosis not present

## 2021-06-21 DIAGNOSIS — M21611 Bunion of right foot: Secondary | ICD-10-CM

## 2021-06-21 DIAGNOSIS — M7752 Other enthesopathy of left foot: Secondary | ICD-10-CM

## 2021-06-21 MED ORDER — TRIAMCINOLONE ACETONIDE 10 MG/ML IJ SUSP: 10.0000 mg | Freq: Once | INTRAMUSCULAR | Status: AC

## 2021-06-21 NOTE — Progress Notes (Signed)
Subjective:  ? ?Patient ID: John [name], male   DOB: 81 y.o.   MRN: [id number]  ? ?HPI ?Patient presents stating of head pain in my big toe joint left over right for several months.  States that I also have discoloration of my right big toenail that I was concerned about.  Patient does not smoke likes to be active ? ? ?Review of Systems  ?All other systems reviewed and are negative. ? ? ?   ?Objective:  ?Physical Exam ?Vitals and nursing note reviewed.  ?Constitutional:   ?   Appearance: He is well-developed.  ?Pulmonary:  ?   Effort: Pulmonary effort is normal.  ?Musculoskeletal:     ?   General: Normal range of motion.  ?Skin: ?   General: Skin is warm.  ?Neurological:  ?   Mental Status: He is alert.  ?  ?Neurovascular status found to be intact muscle strength found to be adequate range of motion adequate.  Patient has inflammation of the first MPJ left over right with fluid around the joint has discoloration of the hallux nail right with thickness of the nailbed itself.  Good digital perfusion well oriented x3 with moderate swelling in his feet bilateral but appears to have venous disease ? ?   ?Assessment:  ?Inflammatory capsulitis of the first MPJ left over right along with venous disease with swelling and mycotic nail infection right ? ?   ?Plan:  ?H&P reviewed all 3 conditions and educated him on all of them.  I do not see much treatment for the swelling except for elevation compression and he can wear compression stockings which I educated him on today and today I did sterile prep injected around the first MPJ 3 mg Kenalog 5 mg Xylocaine left and advised on rigid bottom shoes and do not recommend treatment for nail disease ? ?X-rays indicate there is slight spurring of the first MPJ bilateral mild narrowness of the joint surface mild structural bunion deformity ?   ? ? ?

## 2021-11-06 ENCOUNTER — Other Ambulatory Visit: Payer: Self-pay

## 2021-11-28 NOTE — Progress Notes (Unsigned)
Cardiology Office Note:    Date:  11/30/2021   ID:  DARROLL BREDESON, DOB [birth year], MRN [id number]  PCP:  Cipriano Mile, NP   [place] Providers Cardiologist:  None   Referring MD: Cipriano Mile, NP    History of Present Illness:    RANDELL TEARE is a 81 y.o. male with a hx of HTN, IBS, anxiety, OSA not on CPAP, HLD, CKD IIIB, and sickle cell trait who was referred by Cipriano Mile, NP for further evaluation of chronic diastolic HF.  Patient seen by Cipriano Mile, NP on 11/08/21. Note reviewed. 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 TTE 2017 with LVEF 70% with moderate LVH and G1DD with no significant valve disease. Repeat TTE 09/12/21 with EF 50%, G1DD per report (could not see echo report in our system). Given chronic diastolic HF, he is now referred to Cardiology for further evaluation.  Today, ***  Past Medical History:  Diagnosis Date   Allergic rhinitis    Allergy    Anxiety    no per pt    Aortic atherosclerosis (HCC)    Back pain    BPH (benign prostatic hyperplasia)    CHF (congestive heart failure) (HCC)    Chronic kidney disease    Glaucoma    Gynecomastia    Hemorrhoids    HLD (hyperlipidemia)    Hx of adenomatous colonic polyps    Hypercholesteremia    Hyperplasia of prostate with urinary obstruction    Hypertension NO MED--  WAS TAKES CADURA FOR  BOTH BP AND PROSTATE ---  WAS TOLD TO STOP BY DR Alinda Money  PER WIFE   Overweight    PAD (peripheral artery disease) (HCC)    Prediabetes    Premature ventricular contractions    Prostate cancer ([place]) INITIAL DX STAGE T1c Nx Mx  ADENOCARCINOMA JAN 2003 (PT DECIDED TO PROCEED W/ SURVEILLANCE)--  FOLLOWED BY DR LES [name] , UROLOGIST   PER PT AND WIFE ---  healed with holistic meds--  BY [place]  MD UNTIL  RECENT SYMPTOMS OF URINARY RETENTION   [name] (small bowel obstruction) (HCC)    Seborrheic keratosis    Sickle cell trait (HCC)    Sleep apnea    "doesn't wear mask"  (12/07/2015)   URI (upper respiratory infection)    Urinary retention     Past Surgical History:  Procedure Laterality Date   ARM SURGERY  AGE 53   patient denies at preop appt of 05/20/2014     COLONOSCOPY     COLONOSCOPY W/ BIOPSIES AND POLYPECTOMY  ~ 2002   CYSTOSCOPY  01/03/2012   Procedure: CYSTOSCOPY FLEXIBLE;  Surgeon: Dutch Gray, MD;  [facility];  Service: Urology;  Laterality: N/A;   CYSTOSCOPY N/A 03/27/2012   Procedure: CYSTOSCOPY;  Surgeon: Dutch Gray, MD;  Location: [facility];  Service: Urology;  Laterality: N/A;   CYSTOSCOPY N/A 10/04/2014   Procedure: CYSTOSCOPY FLEXIBLE;  Surgeon: Raynelle Bring, MD;  Location: [facility];  Service: Urology;  Laterality: N/A;   LAPAROTOMY N/A 12/10/2015   Procedure: EXPLORATORY LAPAROTOMY FOR [name];  Surgeon: Donnie Mesa, MD;  Location: [facility];  Service: General;  Laterality: N/A;   POLYPECTOMY     PROSTATE BIOPSY  02/2001   repeat in 11/2002 Dr. Lottie Rater at [facility]  01/03/2012   Procedure: BIOPSY TRANSRECTAL ULTRASONIC PROSTATE (TUBP);  Surgeon: Dutch Gray, MD;  Location: [facility];  Service: Urology;  Laterality:  N/A;  45 MIN    PROSTATE BIOPSY N/A 10/04/2014   Procedure: BIOPSY TRANSRECTAL ULTRASONIC PROSTATE (TUBP);  Surgeon: Raynelle Bring, MD;  Location: [facility];  Service: Urology;  Laterality: N/A;   REFRACTIVE SURGERY     for glaucoma    TRANSURETHRAL RESECTION OF PROSTATE N/A 03/27/2012   Procedure: TRANSURETHRAL RESECTION OF THE PROSTATE WITH GYRUS INSTRUMENTS;  Surgeon: Dutch Gray, MD;  Location: [facility];  Service: Urology;  Laterality: N/A;    Current Medications: No outpatient medications have been marked as taking for the 11/30/21 encounter (Office Visit) with Freada Bergeron, MD.   Current Facility-Administered Medications for the 11/30/21 encounter (Office Visit) with Freada Bergeron, MD  Medication   triamcinolone acetonide (KENALOG) 10 MG/ML injection 10 mg      Allergies:   Amlodipine besylate and Tamsulosin   Social History   Socioeconomic History   Marital status: Married    Spouse name: brenda Shull   Number of children: 1   Years of education: Not on file   Highest education level: Not on file  Occupational History   Not on file  Tobacco Use   Smoking status: Former    Packs/day: 0.50    Years: 15.00    Total pack years: 7.50    Types: Cigarettes    Quit date: 02/12/1969    Years since quitting: 52.8   Smokeless tobacco: Never  Substance and Sexual Activity   Alcohol use: No    Alcohol/week: 0.0 standard drinks of alcohol    Comment: quit in May 16, 1978   Drug use: No    Comment: quit in May 16, 1978   Sexual activity: Yes  Other Topics Concern   Not on file  Social History Narrative   Not on file   Social Determinants of Health   Financial Resource Strain: Not on file  Food Insecurity: Not on file  Transportation Needs: Not on file  Physical Activity: Not on file  Stress: Not on file  Social Connections: Not on file     Family History: The patient's ***family history includes Diabetes in his father; Heart disease in his father. There is no history of Colon cancer, Colon polyps, Esophageal cancer, Rectal cancer, or Stomach cancer.  ROS:   Please see the history of present illness.    *** All other systems reviewed and are negative.  EKGs/Labs/Other Studies Reviewed:    The following studies were reviewed today: TTE May 16, 2015 Left ventricle:  The cavity size was normal. Wall thickness was  increased in a pattern of moderate LVH. Systolic function was  vigorous. The estimated ejection fraction was in the range of 65%  to 70%. Wall motion was normal; there were no regional wall motion  abnormalities. Doppler parameters are consistent with abnormal left  ventricular relaxation (grade 1 diastolic dysfunction).   -------------------------------------------------------------------  Aortic valve:   Structurally normal valve.    Cusp separation was  normal.  Doppler:  Transvalvular velocity was within the normal  range. There was no stenosis. There was no regurgitation.   -------------------------------------------------------------------  Aorta:  Aortic root: The aortic root was normal in size.  Ascending aorta: The ascending aorta was normal in size.   -------------------------------------------------------------------  Mitral valve:   Structurally normal valve.   Leaflet separation was  normal.  Doppler:  Transvalvular velocity was within the normal  range. There was no evidence for stenosis. There was trivial  regurgitation.   -------------------------------------------------------------------  Left atrium:  The atrium was normal in size.   -------------------------------------------------------------------  Right ventricle:  The cavity size was normal. Systolic function was  normal.   -------------------------------------------------------------------  Pulmonic valve:    The valve appears to be grossly normal.  Doppler:  There was no significant regurgitation.   -------------------------------------------------------------------  Tricuspid valve:   The valve appears to be grossly normal.  Doppler:  There was trivial regurgitation.   -------------------------------------------------------------------  Right atrium:  The atrium was normal in size.   -------------------------------------------------------------------  Pericardium:  There was no pericardial effusion.  EKG:  EKG is *** ordered today.  The ekg ordered today demonstrates ***  Recent Labs: No results found for requested labs within last 365 days.  Recent Lipid Panel    Component Value Date/Time   CHOL 180 08/29/2017 1059   TRIG 54.0 08/29/2017 1059   HDL 49.40 08/29/2017 1059   CHOLHDL 4 08/29/2017 1059   VLDL 10.8 08/29/2017 1059   LDLCALC 119 (H) 08/29/2017 1059   LDLDIRECT 140.5 01/23/2008 0926     Risk  Assessment/Calculations:   {Does this patient have ATRIAL FIBRILLATION?:[phone]}  No BP recorded.  {Refresh Note OR Click here to enter BP  :1}***         Physical Exam:    VS:  Pulse 76   Ht '6\' 3"'$  (1.905 m)   Wt 239 lb (108.4 kg)   SpO2 94%   BMI 29.87 kg/m     Wt Readings from Last 3 Encounters:  11/30/21 239 lb (108.4 kg)  06/23/19 235 lb (106.6 kg)  06/09/19 235 lb (106.6 kg)     GEN: *** Well nourished, well developed in no acute distress HEENT: Normal NECK: No JVD; No carotid bruits LYMPHATICS: No lymphadenopathy CARDIAC: ***RRR, no murmurs, rubs, gallops RESPIRATORY:  Clear to auscultation without rales, wheezing or rhonchi  ABDOMEN: Soft, non-tender, non-distended MUSCULOSKELETAL:  No edema; No deformity  SKIN: Warm and dry NEUROLOGIC:  Alert and oriented x 3 PSYCHIATRIC:  Normal affect   ASSESSMENT:    1. Chronic diastolic heart failure ([place])   2. Mixed hyperlipidemia   3. Stage 3b chronic kidney disease ([place])   4. Essential hypertension    PLAN:    In order of problems listed above:  #Chronic Diastolic HF: Last TTE per report in 09/2021 with LVEF 50%, G1DD. Currently euvolemic and compensated on exam with NYHA class *** -Continue losartan '25mg'$  daily -Conitnue coreg 12.'5mg'$  BID -***lasix -**spiro -Low Na diet  #CKD IIIB: Followed by PCP  #HLD: -Continue lipitor '40mg'$  daily -Lipids ***  #HTN: -Continue losartan '25mg'$  daily -Conitnue coreg 12.'5mg'$  BID       {Are you ordering a CV Procedure (e.g. stress test, cath, DCCV, TEE, etc)?   Press F2        :[id number]}    Medication Adjustments/Labs and Tests Ordered: Current medicines are reviewed at length with the patient today.  Concerns regarding medicines are outlined above.  No orders of the defined types were placed in this encounter.  No orders of the defined types were placed in this encounter.   There are no Patient Instructions on file for this visit.   Signed, Freada Bergeron, MD  11/30/2021 3:52 PM    [place]

## 2021-11-30 ENCOUNTER — Encounter: Payer: Self-pay | Admitting: Cardiology

## 2021-11-30 ENCOUNTER — Telehealth: Payer: Self-pay | Admitting: *Deleted

## 2021-11-30 ENCOUNTER — Ambulatory Visit: Payer: Medicare Other | Attending: Cardiology | Admitting: Cardiology

## 2021-11-30 ENCOUNTER — Ambulatory Visit: Payer: Medicare Other | Attending: Cardiology

## 2021-11-30 VITALS — BP 162/91 | HR 76 | Ht 75.0 in | Wt 239.0 lb

## 2021-11-30 DIAGNOSIS — R002 Palpitations: Secondary | ICD-10-CM

## 2021-11-30 DIAGNOSIS — I5033 Acute on chronic diastolic (congestive) heart failure: Secondary | ICD-10-CM

## 2021-11-30 DIAGNOSIS — N1832 Chronic kidney disease, stage 3b: Secondary | ICD-10-CM

## 2021-11-30 DIAGNOSIS — E782 Mixed hyperlipidemia: Secondary | ICD-10-CM

## 2021-11-30 DIAGNOSIS — I10 Essential (primary) hypertension: Secondary | ICD-10-CM | POA: Diagnosis not present

## 2021-11-30 DIAGNOSIS — Z79899 Other long term (current) drug therapy: Secondary | ICD-10-CM

## 2021-11-30 MED ORDER — AMLODIPINE BESYLATE 5 MG PO TABS: 5.0000 mg | ORAL_TABLET | Freq: Every day | ORAL | 1 refills | Status: DC

## 2021-11-30 MED ORDER — POTASSIUM CHLORIDE CRYS ER 20 MEQ PO TBCR: 20.0000 meq | EXTENDED_RELEASE_TABLET | Freq: Every day | ORAL | 4 refills | Status: DC | PRN

## 2021-11-30 MED ORDER — VALSARTAN 160 MG PO TABS: 160.0000 mg | ORAL_TABLET | Freq: Every day | ORAL | 1 refills | Status: DC

## 2021-11-30 MED ORDER — FUROSEMIDE 40 MG PO TABS: 40.0000 mg | ORAL_TABLET | Freq: Every day | ORAL | 4 refills | Status: DC | PRN

## 2021-11-30 NOTE — Progress Notes (Signed)
Cardiology Office Note:    Date:  11/30/2021   ID:  John [name], DOB January 25, 1941, MRN [id number]  PCP:  Cipriano Mile, NP   [facility] Providers Cardiologist:  None   Referring MD: Cipriano Mile, NP    History of Present Illness:    John [name] is a 81 y.o. male with a hx of HTN, IBS, anxiety, OSA not on CPAP, HLD, CKD IIIB, and sickle cell trait who was referred by Cipriano Mile, NP for further evaluation of chronic diastolic HF.  Patient seen by Cipriano Mile, NP on 11/08/21. Note reviewed. 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 TTE 2017 with LVEF 70% with moderate LVH and G1DD with no significant valve disease. Repeat TTE 09/12/21 with EF 50%, G1DD per report (could not see echo report in our system). Given chronic diastolic HF, he is now referred to Cardiology for further evaluation.  Today, the patient states he feels overall well. Reports  that he has had an unusual heartbeat in the past, ongoing for a long time. Sometimes if he lies down on his left side he is able to feel the irregular heartbeats. No associated chest pain, SOB or lightheadedness. Has frequent PACs on ECG today.  Lately he has noticed mild swelling in his ankles. His HCTZ was recently increased from 12.5 to 25 mg by his PCP, taking 1 tablet every morning. He states this has helped some. He denies any history of hospitalization for pulmonary edema or LE edema.   In clinic today his blood pressure is 170/90 (162/91 on recheck). He does not monitor his blood pressure at home, although he does report more controlled blood pressures at prior clinic visits. At the last visit with his PCP his blood pressure was 124/72. Previously losartan had been increased from 25 to 50 mg.  He denies any chest pain, or shortness of breath. No headaches, syncope, orthopnea, or PND.  Of note, he states that he has not been aware of having chronic kidney disease.   Past Medical History:  Diagnosis  Date   Allergic rhinitis    Allergy    Anxiety    no per pt    Aortic atherosclerosis (HCC)    Back pain    BPH (benign prostatic hyperplasia)    CHF (congestive heart failure) (HCC)    Chronic kidney disease    Glaucoma    Gynecomastia    Hemorrhoids    HLD (hyperlipidemia)    Hx of adenomatous colonic polyps    Hypercholesteremia    Hyperplasia of prostate with urinary obstruction    Hypertension NO MED--  WAS TAKES CADURA FOR  BOTH BP AND PROSTATE ---  WAS TOLD TO STOP BY DR Alinda Money  PER WIFE   Overweight    PAD (peripheral artery disease) (HCC)    Prediabetes    Premature ventricular contractions    Prostate cancer ([place]) INITIAL DX STAGE T1c Nx Mx  ADENOCARCINOMA JAN 2003 (PT DECIDED TO PROCEED W/ SURVEILLANCE)--  FOLLOWED BY DR LES [name] , UROLOGIST   PER PT AND WIFE ---  healed with holistic meds--  BY [place] [facility]  MD UNTIL  RECENT SYMPTOMS OF URINARY RETENTION   [name] (small bowel obstruction) (HCC)    Seborrheic keratosis    Sickle cell trait (HCC)    Sleep apnea    "doesn't wear mask" (12/07/2015)   URI (upper respiratory infection)    Urinary retention     Past Surgical History:  Procedure  Laterality Date   ARM SURGERY  AGE 52   patient denies at preop appt of 05/20/2014     COLONOSCOPY     COLONOSCOPY W/ BIOPSIES AND POLYPECTOMY  ~ 2002   CYSTOSCOPY  01/03/2012   Procedure: CYSTOSCOPY FLEXIBLE;  Surgeon: Dutch Gray, MD;  Location: [facility];  Service: Urology;  Laterality: N/A;   CYSTOSCOPY N/A 03/27/2012   Procedure: CYSTOSCOPY;  Surgeon: Dutch Gray, MD;  Location: [facility];  Service: Urology;  Laterality: N/A;   CYSTOSCOPY N/A 10/04/2014   Procedure: CYSTOSCOPY FLEXIBLE;  Surgeon: Raynelle Bring, MD;  Location: [facility];  Service: Urology;  Laterality: N/A;   LAPAROTOMY N/A 12/10/2015   Procedure: EXPLORATORY LAPAROTOMY FOR [name];  Surgeon: Donnie Mesa, MD;  Location: [facility];  Service: General;  Laterality: N/A;   POLYPECTOMY     PROSTATE BIOPSY   02/2001   repeat in 11/2002 Dr. Lottie Rater at [place]  01/03/2012   Procedure: BIOPSY TRANSRECTAL ULTRASONIC PROSTATE (TUBP);  Surgeon: Dutch Gray, MD;  Location: [facility];  Service: Urology;  Laterality: N/A;  45 MIN    PROSTATE BIOPSY N/A 10/04/2014   Procedure: BIOPSY TRANSRECTAL ULTRASONIC PROSTATE (TUBP);  Surgeon: Raynelle Bring, MD;  Location: [facility];  Service: Urology;  Laterality: N/A;   REFRACTIVE SURGERY     for glaucoma    TRANSURETHRAL RESECTION OF PROSTATE N/A 03/27/2012   Procedure: TRANSURETHRAL RESECTION OF THE PROSTATE WITH GYRUS INSTRUMENTS;  Surgeon: Dutch Gray, MD;  Location: [facility];  Service: Urology;  Laterality: N/A;    Current Medications: Current Meds  Medication Sig   amLODipine (NORVASC) 5 MG tablet Take 1 tablet (5 mg total) by mouth daily.   bimatoprost (LUMIGAN) 0.01 % SOLN Place 1 drop into both eyes at bedtime.   brimonidine (ALPHAGAN) 0.2 % ophthalmic solution 1 drop daily.   carvedilol (COREG) 12.5 MG tablet TAKE 1 TABLET (12.5 MG TOTAL) BY MOUTH 2 (TWO) TIMES DAILY WITH A MEAL.   dorzolamide-timolol (COSOPT) 22.3-6.8 MG/ML ophthalmic solution 1 drop 2 (two) times daily.   furosemide (LASIX) 40 MG tablet Take 1 tablet (40 mg total) by mouth daily as needed (lower extremity swelling).   latanoprost (XALATAN) 0.005 % ophthalmic solution SMARTSIG:1 Drop(s) In Eye(s) Every Evening   Multiple Vitamin (MULTIVITAMIN) tablet Take 1 tablet by mouth daily.   potassium chloride SA (KLOR-CON M) 20 MEQ tablet Take 1 tablet (20 mEq total) by mouth daily as needed (when you take your lasix.).   valsartan (DIOVAN) 160 MG tablet Take 1 tablet (160 mg total) by mouth daily.   [DISCONTINUED] losartan (COZAAR) 50 MG tablet Take 50 mg by mouth daily.   Current Facility-Administered Medications for the 11/30/21 encounter (Office Visit) with Freada Bergeron, MD  Medication   triamcinolone acetonide (KENALOG) 10 MG/ML injection 10 mg      Allergies:   Amlodipine besylate and Tamsulosin   Social History   Socioeconomic History   Marital status: Married    Spouse name: brenda Ramone   Number of children: 1   Years of education: Not on file   Highest education level: Not on file  Occupational History   Not on file  Tobacco Use   Smoking status: Former    Packs/day: 0.50    Years: 15.00    Total pack years: 7.50    Types: Cigarettes    Quit date: 02/12/1969    Years since quitting: 52.8   Smokeless tobacco: Never  Substance and  Sexual Activity   Alcohol use: No    Alcohol/week: 0.0 standard drinks of alcohol    Comment: quit in 1980   Drug use: No    Comment: quit in 1980   Sexual activity: Yes  Other Topics Concern   Not on file  Social History Narrative   Not on file   Social Determinants of Health   Financial Resource Strain: Not on file  Food Insecurity: Not on file  Transportation Needs: Not on file  Physical Activity: Not on file  Stress: Not on file  Social Connections: Not on file     Family History: The patient's family history includes Diabetes in his father; Heart disease in his father. There is no history of Colon cancer, Colon polyps, Esophageal cancer, Rectal cancer, or Stomach cancer.  ROS:   Review of Systems  Constitutional:  Negative for chills and fever.  HENT:  Negative for nosebleeds and tinnitus.   Eyes:  Negative for blurred vision and pain.  Respiratory:  Negative for cough, hemoptysis, shortness of breath and stridor.   Cardiovascular:  Positive for palpitations and leg swelling (Bilateral ankles). Negative for chest pain, orthopnea, claudication and PND.  Gastrointestinal:  Negative for blood in stool, diarrhea, nausea and vomiting.  Genitourinary:  Negative for dysuria and hematuria.  Musculoskeletal:  Negative for falls.  Neurological:  Positive for dizziness. Negative for loss of consciousness and headaches.  Psychiatric/Behavioral:  Negative for depression,  hallucinations and substance abuse. The patient does not have insomnia.      EKGs/Labs/Other Studies Reviewed:    The following studies were reviewed today:  TTE 2017 Left ventricle:  The cavity size was normal. Wall thickness was  increased in a pattern of moderate LVH. Systolic function was  vigorous. The estimated ejection fraction was in the range of 65%  to 70%. Wall motion was normal; there were no regional wall motion  abnormalities. Doppler parameters are consistent with abnormal left  ventricular relaxation (grade 1 diastolic dysfunction).   -------------------------------------------------------------------  Aortic valve:   Structurally normal valve.   Cusp separation was  normal.  Doppler:  Transvalvular velocity was within the normal  range. There was no stenosis. There was no regurgitation.   -------------------------------------------------------------------  Aorta:  Aortic root: The aortic root was normal in size.  Ascending aorta: The ascending aorta was normal in size.   -------------------------------------------------------------------  Mitral valve:   Structurally normal valve.   Leaflet separation was  normal.  Doppler:  Transvalvular velocity was within the normal  range. There was no evidence for stenosis. There was trivial  regurgitation.   -------------------------------------------------------------------  Left atrium:  The atrium was normal in size.   -------------------------------------------------------------------  Right ventricle:  The cavity size was normal. Systolic function was  normal.   -------------------------------------------------------------------  Pulmonic valve:    The valve appears to be grossly normal.  Doppler:  There was no significant regurgitation.   -------------------------------------------------------------------  Tricuspid valve:   The valve appears to be grossly normal.  Doppler:  There was trivial regurgitation.    -------------------------------------------------------------------  Right atrium:  The atrium was normal in size.   -------------------------------------------------------------------  Pericardium:  There was no pericardial effusion.   EKG:  EKG is personally reviewed. 11/30/2021:  Sinus rhythm. PACs. LAFB. LVH. Rate 76 bpm.  Recent Labs: No results found for requested labs within last 365 days.   Recent Lipid Panel    Component Value Date/Time   CHOL 180 08/29/2017 1059   TRIG 54.0 08/29/2017 1059  HDL 49.40 08/29/2017 1059   CHOLHDL 4 08/29/2017 1059   VLDL 10.8 08/29/2017 1059   LDLCALC 119 (H) 08/29/2017 1059   LDLDIRECT 140.5 01/23/2008 0926     Risk Assessment/Calculations:      HYPERTENSION CONTROL Vitals:   11/30/21 1538 11/30/21 1613  BP: (!) 170/90 (!) 162/91    The patient's blood pressure is elevated above target today.  In order to address the patient's elevated BP: A new medication was prescribed today.            Physical Exam:    VS:  BP (!) 162/91 (BP Location: Left Arm, Patient Position: Sitting, Cuff Size: Normal)   Pulse 76   Ht '6\' 3"'$  (1.905 m)   Wt 239 lb (108.4 kg)   SpO2 94%   BMI 29.87 kg/m     Wt Readings from Last 3 Encounters:  11/30/21 239 lb (108.4 kg)  06/23/19 235 lb (106.6 kg)  06/09/19 235 lb (106.6 kg)     GEN: Well nourished, well developed in no acute distress HEENT: Normal NECK: No JVD; No carotid bruits CARDIAC: Irregularly irregular, 1/6 systolic murmur, no rubs, no gallops RESPIRATORY:  Clear to auscultation without rales, wheezing or rhonchi  ABDOMEN: Soft, non-tender, non-distended MUSCULOSKELETAL:  1+ LE ankle edema with R>L; No deformity  SKIN: Warm and dry NEUROLOGIC:  Alert and oriented x 3 PSYCHIATRIC:  Normal affect   ASSESSMENT:    1. Acute on chronic diastolic (congestive) heart failure ([place])   2. Mixed hyperlipidemia   3. Stage 3b chronic kidney disease ([place])   4. Essential hypertension    5. Medication management   6. Palpitations    PLAN:    In order of problems listed above:  #Acute on Chronic Diastolic HF: Last TTE per report in 09/2021 with LVEF 50%, G1DD. Currently with mild volume overload with LE edema in the setting of significantly elevated blood pressures. Will adjust medications as below. -Change HCTZ to lasix '40mg'$  daily prn for LE edema -Start K+ 63mq with lasix dosing -Change losartan to valsartan '160mg'$  daily -Continue coreg '25mg'$  BID -BMET next week -Low Na diet -Will obtain echo from PCP office  #Frequent PACs: Noted on ECG. Has occasional palpitations. Will check zio for further evaluation. -Check 3 day zio -Continue coreg '25mg'$  BID as above  #HTN: Very elevated today. Unclear what he is running at home. Has LVH on TTE in 2017. Will adjust meds as below. -Change losartan to valsartan '160mg'$  daily -Continue coreg '25mg'$  BID -Start amlodipine '5mg'$  daily -Check BP at home and call if sustaining >130/90 -Pharm D follow-up  #CKD IIIB: Followed by PCP.  -Check BMET following medication adjustments as above  #HLD: -Continue lipitor '40mg'$  daily -Managed by PCP            Follow-up:  1 month with PharmD. 3 months with APP.   Medication Adjustments/Labs and Tests Ordered: Current medicines are reviewed at length with the patient today.  Concerns regarding medicines are outlined above.   Orders Placed This Encounter  Procedures   Basic metabolic panel   AMB Referral to [facility]   LONG TERM MONITOR (3-14 DAYS)   EKG 12-Lead   Meds ordered this encounter  Medications   furosemide (LASIX) 40 MG tablet    Sig: Take 1 tablet (40 mg total) by mouth daily as needed (lower extremity swelling).    Dispense:  30 tablet    Refill:  4   potassium chloride SA (KLOR-CON M) 20 MEQ  tablet    Sig: Take 1 tablet (20 mEq total) by mouth daily as needed (when you take your lasix.).    Dispense:  30 tablet    Refill:  4   valsartan (DIOVAN) 160  MG tablet    Sig: Take 1 tablet (160 mg total) by mouth daily.    Dispense:  90 tablet    Refill:  1   amLODipine (NORVASC) 5 MG tablet    Sig: Take 1 tablet (5 mg total) by mouth daily.    Dispense:  90 tablet    Refill:  1   Patient Instructions  Medication Instructions:   STOP TAKING HYDROCHLOROTHIAZIDE NOW  STOP TAKING LOSARTAN NOW  START TAKING FUROSEMIDE (LASIX) 40 MG BY MOUTH DAILY AS NEEDED FOR LOWER EXTREMITY SWELLING  START TAKING POTASSIUM CHLORIDE 20 mEq BY MOUTH DAILY AS NEEDED WHEN YOU TAKE YOUR LASIX   START TAKING VALSARTAN 160 MG BY MOUTH DAILY  START TAKING AMLODIPINE 5 MG BY MOUTH DAILY  *If you need a refill on your cardiac medications before your next appointment, please call your pharmacy*   You have been referred to [facility]   Lab Work:  IN ONE WEEK HERE IN THE OFFICE--BMET  If you have labs (blood work) drawn today and your tests are completely normal, you will receive your results only by: [employer] (if you have MyChart) OR A paper copy in the mail If you have any lab test that is abnormal or we need to change your treatment, we will call you to review the results.   Testing/Procedures:  Bryn Gulling- Long Term Monitor Instructions  Your physician has requested you wear a ZIO patch monitor for 3 days.  This is a single patch monitor. [employer] supplies one patch monitor per enrollment. Additional stickers are not available. Please do not apply patch if you will be having a Nuclear Stress Test,  Echocardiogram, Cardiac CT, MRI, or Chest Xray during the period you would be wearing the  monitor. The patch cannot be worn during these tests. You cannot remove and re-apply the  ZIO XT patch monitor.  Your ZIO patch monitor will be mailed 3 day USPS to your address on file. It may take 3-5 days  to receive your monitor after you have been enrolled.  Once you have received your monitor, please  review the enclosed instructions. Your monitor  has already been registered assigning a specific monitor serial # to you.  Billing and Patient Assistance Program Information  We have supplied [employer] with any of your insurance information on file for billing purposes. [employer] offers a sliding scale Patient Assistance Program for patients that do not have  insurance, or whose insurance does not completely cover the cost of the ZIO monitor.  You must apply for the Patient Assistance Program to qualify for this discounted rate.  To apply, please call [employer] at [phone], select option 4, select option 2, ask to apply for  Patient Assistance Program. Theodore Demark will ask your household income, and how many people  are in your household. They will quote your out-of-pocket cost based on that information.  [employer] will also be able to set up a 68-month interest-free payment plan if needed.  Applying the monitor   Shave hair from upper left chest.  Hold abrader disc by orange tab. Rub abrader in 40 strokes over the upper left chest as  indicated in your monitor instructions.  Clean area  with 4 enclosed alcohol pads. Let dry.  Apply patch as indicated in monitor instructions. Patch will be placed under collarbone on left  side of chest with arrow pointing upward.  Rub patch adhesive wings for 2 minutes. Remove white label marked "1". Remove the white  label marked "2". Rub patch adhesive wings for 2 additional minutes.  While looking in a mirror, press and release button in center of patch. A small green light will  flash 3-4 times. This will be your only indicator that the monitor has been turned on.  Do not shower for the first 24 hours. You may shower after the first 24 hours.  Press the button if you feel a symptom. You will hear a small click. Record Date, Time and  Symptom in the Patient Logbook.  When you are ready to remove the patch, follow instructions on the last 2 pages of Patient   Logbook. Stick patch monitor onto the last page of Patient Logbook.  Place Patient Logbook in the blue and white box. Use locking tab on box and tape box closed  securely. The blue and white box has prepaid postage on it. Please place it in the mailbox as  soon as possible. Your physician should have your test results approximately 7 days after the  monitor has been mailed back to [facility]  Call [facility] at [phone] if you have questions regarding  your ZIO XT patch monitor. Call them immediately if you see an orange light blinking on your  monitor.  If your monitor falls off in less than 4 days, contact our Monitor department at [phone].  If your monitor becomes loose or falls off after 4 days call [employer] at [phone] for  suggestions on securing your monitor    Follow-Up:  3 MONTHS WITH AN EXTENDER IN THE OFFICE   Important Information About Sugar         I,Mathew Stumpf,acting as a scribe for Freada Bergeron, MD.,have documented all relevant documentation on the behalf of Freada Bergeron, MD,as directed by  Freada Bergeron, MD while in the presence of Freada Bergeron, MD.  I, Freada Bergeron, MD, have reviewed all documentation for this visit. The documentation on 11/30/21 for the exam, diagnosis, procedures, and orders are all accurate and complete.   Signed, Freada Bergeron, MD  11/30/2021 5:04 PM    [place]

## 2021-11-30 NOTE — Patient Instructions (Signed)
Medication Instructions:   STOP TAKING HYDROCHLOROTHIAZIDE NOW  STOP TAKING LOSARTAN NOW  START TAKING FUROSEMIDE (LASIX) 40 MG BY MOUTH DAILY AS NEEDED FOR LOWER EXTREMITY SWELLING  START TAKING POTASSIUM CHLORIDE 20 mEq BY MOUTH DAILY AS NEEDED WHEN YOU TAKE YOUR LASIX   START TAKING VALSARTAN 160 MG BY MOUTH DAILY  START TAKING AMLODIPINE 5 MG BY MOUTH DAILY  *If you need a refill on your cardiac medications before your next appointment, please call your pharmacy*   You have been referred to [facility]   Lab Work:  IN ONE WEEK HERE IN THE OFFICE--BMET  If you have labs (blood work) drawn today and your tests are completely normal, you will receive your results only by: [employer] (if you have MyChart) OR A paper copy in the mail If you have any lab test that is abnormal or we need to change your treatment, we will call you to review the results.   Testing/Procedures:  Bryn Gulling- Long Term Monitor Instructions  Your physician has requested you wear a ZIO patch monitor for 3 days.  This is a single patch monitor. [employer] supplies one patch monitor per enrollment. Additional stickers are not available. Please do not apply patch if you will be having a Nuclear Stress Test,  Echocardiogram, Cardiac CT, MRI, or Chest Xray during the period you would be wearing the  monitor. The patch cannot be worn during these tests. You cannot remove and re-apply the  ZIO XT patch monitor.  Your ZIO patch monitor will be mailed 3 day USPS to your address on file. It may take 3-5 days  to receive your monitor after you have been enrolled.  Once you have received your monitor, please review the enclosed instructions. Your monitor  has already been registered assigning a specific monitor serial # to you.  Billing and Patient Assistance Program Information  We have supplied [employer] with any of your insurance information on file for  billing purposes. [employer] offers a sliding scale Patient Assistance Program for patients that do not have  insurance, or whose insurance does not completely cover the cost of the ZIO monitor.  You must apply for the Patient Assistance Program to qualify for this discounted rate.  To apply, please call [employer] at [phone], select option 4, select option 2, ask to apply for  Patient Assistance Program. Theodore Demark will ask your household income, and how many people  are in your household. They will quote your out-of-pocket cost based on that information.  [employer] will also be able to set up a 39-month interest-free payment plan if needed.  Applying the monitor   Shave hair from upper left chest.  Hold abrader disc by orange tab. Rub abrader in 40 strokes over the upper left chest as  indicated in your monitor instructions.  Clean area with 4 enclosed alcohol pads. Let dry.  Apply patch as indicated in monitor instructions. Patch will be placed under collarbone on left  side of chest with arrow pointing upward.  Rub patch adhesive wings for 2 minutes. Remove white label marked "1". Remove the white  label marked "2". Rub patch adhesive wings for 2 additional minutes.  While looking in a mirror, press and release button in center of patch. A small green light will  flash 3-4 times. This will be your only indicator that the monitor has been turned on.  Do not shower for the first 24 hours. You may  shower after the first 24 hours.  Press the button if you feel a symptom. You will hear a small click. Record Date, Time and  Symptom in the Patient Logbook.  When you are ready to remove the patch, follow instructions on the last 2 pages of Patient  Logbook. Stick patch monitor onto the last page of Patient Logbook.  Place Patient Logbook in the blue and white box. Use locking tab on box and tape box closed  securely. The blue and white box has prepaid postage on it. Please place it in the mailbox  as  soon as possible. Your physician should have your test results approximately 7 days after the  monitor has been mailed back to [facility].  Call [place] at [phone] if you have questions regarding  your ZIO XT patch monitor. Call them immediately if you see an orange light blinking on your  monitor.  If your monitor falls off in less than 4 days, contact our Monitor department at [phone].  If your monitor becomes loose or falls off after 4 days call [employer] at [phone] for  suggestions on securing your monitor    Follow-Up:  3 MONTHS WITH AN EXTENDER IN THE OFFICE   Important Information About Sugar

## 2021-11-30 NOTE — Progress Notes (Unsigned)
Enrolled for [employer] to mail a ZIO XT long term holter monitor to the patients address on file.  

## 2021-11-30 NOTE — Telephone Encounter (Signed)
-----   Message from Jennefer Bravo sent at 11/30/2021  5:27 PM EDT ----- Regarding: RE: 3 DAY ZIO PER DR. Johney Frame done ----- Message ----- From: Nuala Alpha, LPN Sent: 14/23/9532   4:34 PM EDT To: Nuala Alpha, LPN; Shelly A Wells Subject: 3 DAY ZIO PER DR. Johney Frame                    Dr. Johney Frame ordered a 3 day zio for palpitations.  Please enroll and let me know when you do  Thanks [name]

## 2021-12-05 DIAGNOSIS — N1832 Chronic kidney disease, stage 3b: Secondary | ICD-10-CM

## 2021-12-05 DIAGNOSIS — R002 Palpitations: Secondary | ICD-10-CM

## 2021-12-05 DIAGNOSIS — I10 Essential (primary) hypertension: Secondary | ICD-10-CM

## 2021-12-05 DIAGNOSIS — E782 Mixed hyperlipidemia: Secondary | ICD-10-CM | POA: Diagnosis not present

## 2021-12-05 DIAGNOSIS — Z79899 Other long term (current) drug therapy: Secondary | ICD-10-CM

## 2021-12-05 DIAGNOSIS — I5033 Acute on chronic diastolic (congestive) heart failure: Secondary | ICD-10-CM | POA: Diagnosis not present

## 2021-12-06 ENCOUNTER — Ambulatory Visit: Payer: Medicare Other | Attending: Cardiology

## 2021-12-06 ENCOUNTER — Other Ambulatory Visit: Payer: Medicare Other

## 2021-12-06 DIAGNOSIS — I5033 Acute on chronic diastolic (congestive) heart failure: Secondary | ICD-10-CM

## 2021-12-06 DIAGNOSIS — N1832 Chronic kidney disease, stage 3b: Secondary | ICD-10-CM

## 2021-12-06 DIAGNOSIS — E782 Mixed hyperlipidemia: Secondary | ICD-10-CM

## 2021-12-06 DIAGNOSIS — Z79899 Other long term (current) drug therapy: Secondary | ICD-10-CM

## 2021-12-06 DIAGNOSIS — I10 Essential (primary) hypertension: Secondary | ICD-10-CM

## 2021-12-06 DIAGNOSIS — R002 Palpitations: Secondary | ICD-10-CM

## 2021-12-07 ENCOUNTER — Telehealth: Payer: Self-pay | Admitting: Cardiology

## 2021-12-07 LAB — BASIC METABOLIC PANEL WITH GFR
BUN/Creatinine Ratio: 16 (ref 10–24)
BUN: 23 mg/dL (ref 8–27)
CO2: 26 mmol/L (ref 20–29)
Calcium: 9.5 mg/dL (ref 8.6–10.2)
Chloride: 101 mmol/L (ref 96–106)
Creatinine, Ser: 1.43 mg/dL — ABNORMAL HIGH (ref 0.76–1.27)
Glucose: 132 mg/dL — ABNORMAL HIGH (ref 70–99)
Potassium: 3.7 mmol/L (ref 3.5–5.2)
Sodium: 143 mmol/L (ref 134–144)
eGFR: 50 mL/min/1.73 — ABNORMAL LOW

## 2021-12-07 NOTE — Telephone Encounter (Signed)
Pt is calling in regards to results. Transferred to [facility], [place].

## 2021-12-07 NOTE — Telephone Encounter (Signed)
The patient has been notified of the result and verbalized understanding.  All questions (if any) were answered. Nuala Alpha, LPN [id number] 5:00 PM

## 2021-12-07 NOTE — Telephone Encounter (Signed)
-----   Message from Freada Bergeron, MD sent at 12/07/2021 11:54 AM EDT ----- Kidney function and electrolytes look great.

## 2021-12-22 ENCOUNTER — Telehealth: Payer: Self-pay | Admitting: *Deleted

## 2021-12-22 ENCOUNTER — Telehealth: Payer: Self-pay | Admitting: Cardiology

## 2021-12-22 DIAGNOSIS — I471 Supraventricular tachycardia, unspecified: Secondary | ICD-10-CM

## 2021-12-22 DIAGNOSIS — I5033 Acute on chronic diastolic (congestive) heart failure: Secondary | ICD-10-CM

## 2021-12-22 DIAGNOSIS — R9431 Abnormal electrocardiogram [ECG] [EKG]: Secondary | ICD-10-CM

## 2021-12-22 DIAGNOSIS — I4729 Other ventricular tachycardia: Secondary | ICD-10-CM

## 2021-12-22 NOTE — Telephone Encounter (Signed)
-----   Message from Freada Bergeron, MD sent at 12/21/2021 11:17 AM EST ----- His cardiac monitor shows he has frequent extra beats from the top chamber of the heart. There were also frequent short runs from the top chamber of the heart and rare runs of extra beats from the bottom chamber of the heart. The coreg should help suppress these beats but we need a little more information. Is he okay doing an echo so we can get a better idea of what his heart looks like?   How is his blood pressure doing?

## 2021-12-22 NOTE — Telephone Encounter (Signed)
  Pt is returning call to get heart monitor result. He said to call his home number if call back will be in an hour if not then call his mobile number

## 2021-12-22 NOTE — Telephone Encounter (Signed)
The patient has been notified of the result and verbalized understanding.  All questions (if any) were answered.  Pt agreed to have an echo done to further investigate frequent extra beats on his monitor.   Pt states he hasn't been tracking his pressures at home yet, for he just got his cuff yesterday.  He states he will try and figure out how to work this and start recording his numbers.  Advised him to start recording his pressures and bring those into the office to his PharmD appt with BP clinic on 11/30.  Pt aware that I will go ahead and place the order for an echo in the system and send a message to our Echo Scheduler to call him back and arrange this appt.   Pt states to endorse to echo scheduler to call his cell today, for he will be leaving his home soon.  Will endorse this to the scheduler.  Pt verbalized understanding and agrees with this plan.  Will send this message to Dr. Johney Frame as an [place].

## 2021-12-22 NOTE — Telephone Encounter (Signed)
Me      12/22/21  9:58 AM Note The patient has been notified of the result and verbalized understanding.  All questions (if any) were answered.   Pt agreed to have an echo done to further investigate frequent extra beats on his monitor.    Pt states he hasn't been tracking his pressures at home yet, for he just got his cuff yesterday.  He states he will try and figure out how to work this and start recording his numbers.  Advised him to start recording his pressures and bring those into the office to his PharmD appt with BP clinic on 11/30.   Pt aware that I will go ahead and place the order for an echo in the system and send a message to our Echo Scheduler to call him back and arrange this appt.    Pt states to endorse to echo scheduler to call his cell today, for he will be leaving his home soon.  Will endorse this to the scheduler.   Pt verbalized understanding and agrees with this plan.   Will send this message to Dr. Johney Frame as an [place].           12/22/21  9:58 AM You routed this conversation to Freada Bergeron, MD   Me      12/22/21  9:47 AM Note ----- Message from Freada Bergeron, MD sent at 12/21/2021 11:17 AM EST ----- His cardiac monitor shows he has frequent extra beats from the top chamber of the heart. There were also frequent short runs from the top chamber of the heart and rare runs of extra beats from the bottom chamber of the heart. The coreg should help suppress these beats but we need a little more information. Is he okay doing an echo so we can get a better idea of what his heart looks like?    How is his blood pressure doing?

## 2021-12-25 ENCOUNTER — Ambulatory Visit (HOSPITAL_COMMUNITY): Payer: Medicare Other | Attending: Cardiology

## 2021-12-25 DIAGNOSIS — I4729 Other ventricular tachycardia: Secondary | ICD-10-CM | POA: Diagnosis not present

## 2021-12-25 DIAGNOSIS — I471 Supraventricular tachycardia, unspecified: Secondary | ICD-10-CM

## 2021-12-25 DIAGNOSIS — I5033 Acute on chronic diastolic (congestive) heart failure: Secondary | ICD-10-CM | POA: Diagnosis not present

## 2021-12-25 DIAGNOSIS — R9431 Abnormal electrocardiogram [ECG] [EKG]: Secondary | ICD-10-CM | POA: Diagnosis not present

## 2021-12-25 LAB — ECHOCARDIOGRAM COMPLETE
Area-P 1/2: 2.34 cm2
P 1/2 time: 849 msec
S' Lateral: 3.1 cm

## 2021-12-27 ENCOUNTER — Telehealth: Payer: Self-pay | Admitting: *Deleted

## 2021-12-27 DIAGNOSIS — I77819 Aortic ectasia, unspecified site: Secondary | ICD-10-CM

## 2021-12-27 DIAGNOSIS — I77810 Thoracic aortic ectasia: Secondary | ICD-10-CM

## 2021-12-27 NOTE — Telephone Encounter (Signed)
-----   Message from Freada Bergeron, MD sent at 12/25/2021  7:09 PM EST ----- His echo shows normal pumping function with EF 55-60%. There is mild to moderate leakiness of the aortic valve and mild dilation of the aortic root which we will monitor with yearly echoes. We just need to ensure his blood pressure is well controlled to prevent the aorta from dilating further.

## 2021-12-27 NOTE — Telephone Encounter (Signed)
The patient has been notified of the result and verbalized understanding.  All questions (if any) were answered.  Pt aware to always maintain good BP control and continue his current med regimen.  He is aware to start monitoring his BP's at home and bring those recordings to his upcoming appt with our BP clinic on 11/30.  Advised him to watch his salt intake.   He is aware that he will need a repeat echo in one year for surveillance of his aortic root.  He is aware that I will go ahead and place the order for this, and have our Echo Scheduler give him a call back to arrange this appt for that time frame.   Pt verbalized understanding and agrees with this plan.

## 2022-01-01 ENCOUNTER — Ambulatory Visit: Payer: Medicare Other | Admitting: Nurse Practitioner

## 2022-01-11 ENCOUNTER — Ambulatory Visit: Payer: Medicare Other | Admitting: Student

## 2022-01-11 NOTE — Progress Notes (Deleted)
Change losartan to valsartan '160mg'$  daily -Continue coreg '25mg'$  BID -Start amlodipine '5mg'$  daily -Check BP at home and call if sustaining >130/90 -Pharm D follow-up plan form [place] on 11/30/2021  Change HCTZ to lasix '40mg'$  daily prn for LE edema -Start K+ 43mq with lasix dosing -Change losartan to valsartan '160mg'$  daily -Continue coreg '25mg'$  BID -BMET next week -Low Na diet -Will obtain echo from PCP office  Patient ID: John [name]                DOB: 110/20/1942                     MRN: [id number]     HPI: JLINDON KIELis a 81y.o. male referred by Dr. PJohney Frameto HTN clinic. PMH is significant for hypertension, cardiac arrhythmia, IBS, prostate cancer. 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 TTE 2017 with LVEF 70% with moderate LVH and G1DD with no significant valve disease. Repeat TTE 09/12/21 with EF 50%, G1DD per report (could not see echo report in our system).   Current HTN meds:  Previously tried:  BP goal:   Family History:   Social History:   Diet:   Exercise:  {types:28256}  Home BP readings:  Date SBP/DBP  HR              Average      Wt Readings from Last 3 Encounters:  11/30/21 239 lb (108.4 kg)  06/23/19 235 lb (106.6 kg)  06/09/19 235 lb (106.6 kg)   BP Readings from Last 3 Encounters:  11/30/21 (!) 162/91  06/23/19 124/82  08/29/17 (!) 142/80   Pulse Readings from Last 3 Encounters:  11/30/21 76  06/23/19 (!) 54  08/29/17 88    Renal function: CrCl cannot be calculated (Patient's most recent lab result is older than the maximum 21 days allowed.).  Past Medical History:  Diagnosis Date   Allergic rhinitis    Allergy    Anxiety    no per pt    Aortic atherosclerosis (HCC)    Back pain    BPH (benign prostatic hyperplasia)    CHF (congestive heart failure) (HCC)    Chronic kidney disease    Glaucoma    Gynecomastia    Hemorrhoids    HLD (hyperlipidemia)    Hx of adenomatous colonic polyps     Hypercholesteremia    Hyperplasia of prostate with urinary obstruction    Hypertension NO MED--  WAS TAKES CADURA FOR  BOTH BP AND PROSTATE ---  WAS TOLD TO STOP BY DR BAlinda Money PER WIFE   Overweight    PAD (peripheral artery disease) (HCC)    Prediabetes    Premature ventricular contractions    Prostate cancer ([place] INITIAL DX STAGE T1c Nx Mx  ADENOCARCINOMA JAN 2003 (PT DECIDED TO PROCEED W/ SURVEILLANCE)--  FOLLOWED BY DR LES [name] , UROLOGIST   PER PT AND WIFE ---  healed with holistic meds--  BY [place]  MD UNTIL  RECENT SYMPTOMS OF URINARY RETENTION   [name] (small bowel obstruction) (HCC)    Seborrheic keratosis    Sickle cell trait (HCC)    Sleep apnea    "doesn't wear mask" (12/07/2015)   URI (upper respiratory infection)    Urinary retention     Current Outpatient Medications on File Prior to Visit  Medication Sig Dispense Refill   amLODipine (NORVASC) 5 MG tablet Take 1 tablet (5 mg total)  by mouth daily. 90 tablet 1   bimatoprost (LUMIGAN) 0.01 % SOLN Place 1 drop into both eyes at bedtime.     brimonidine (ALPHAGAN) 0.2 % ophthalmic solution 1 drop daily.     carvedilol (COREG) 12.5 MG tablet TAKE 1 TABLET (12.5 MG TOTAL) BY MOUTH 2 (TWO) TIMES DAILY WITH A MEAL. 180 tablet 1   dorzolamide-timolol (COSOPT) 22.3-6.8 MG/ML ophthalmic solution 1 drop 2 (two) times daily.     furosemide (LASIX) 40 MG tablet Take 1 tablet (40 mg total) by mouth daily as needed (lower extremity swelling). 30 tablet 4   latanoprost (XALATAN) 0.005 % ophthalmic solution SMARTSIG:1 Drop(s) In Eye(s) Every Evening     Multiple Vitamin (MULTIVITAMIN) tablet Take 1 tablet by mouth daily.     potassium chloride SA (KLOR-CON M) 20 MEQ tablet Take 1 tablet (20 mEq total) by mouth daily as needed (when you take your lasix.). 30 tablet 4   valsartan (DIOVAN) 160 MG tablet Take 1 tablet (160 mg total) by mouth daily. 90 tablet 1   Current Facility-Administered Medications on File Prior to Visit  Medication  Dose Route Frequency Provider Last Rate Last Admin   triamcinolone acetonide (KENALOG) 10 MG/ML injection 10 mg  10 mg Other Once Regal, Norman S, DPM        Allergies  Allergen Reactions   Amlodipine Besylate Other (See Comments)    REACTION: causes nightmares   Tamsulosin Other (See Comments)     dizziness    There were no vitals taken for this visit.   Assessment/Plan:  1. Hypertension -  No problem-specific Assessment & Plan notes found for this encounter.  '@MTPCOMPLETEDLIST'$ @  {f/u with PharmD:28259}  Thank you  Cammy Copa, Pharm.D [facility] A Division of [facility] [place] [address] [place], [place] [address]  Phone: [phone]; Fax: [phone]

## 2022-01-11 NOTE — Assessment & Plan Note (Deleted)
Assessment: BP is un/controlled in office BP ***/ ** mmHg above the goal (<130/80).  Tolerates ***well without any side effects  Denies SOB, palpitation, chest pain, headaches,or swelling .  Reiderated the importance of regular exercise and low salt diet   Plan:  Start taking Continue taking  Patient to keep record of BP readings with heart rate and report [place] at the next visit Patient to see Pharma D in ** weeks for follow up

## 2022-02-03 ENCOUNTER — Ambulatory Visit (HOSPITAL_COMMUNITY)
Admission: EM | Admit: 2022-02-03 | Discharge: 2022-02-03 | Disposition: A | Discharge status: Home / Self Care | Payer: Medicare Other | Attending: Internal Medicine | Admitting: Internal Medicine

## 2022-02-03 ENCOUNTER — Encounter (HOSPITAL_COMMUNITY): Payer: Self-pay | Admitting: Emergency Medicine

## 2022-02-03 DIAGNOSIS — N410 Acute prostatitis: Secondary | ICD-10-CM | POA: Insufficient documentation

## 2022-02-03 DIAGNOSIS — I10 Essential (primary) hypertension: Secondary | ICD-10-CM | POA: Diagnosis not present

## 2022-02-03 DIAGNOSIS — R310 Gross hematuria: Secondary | ICD-10-CM | POA: Diagnosis not present

## 2022-02-03 LAB — COMPREHENSIVE METABOLIC PANEL
ALT: 14 U/L (ref 0–44)
AST: 21 U/L (ref 15–41)
Albumin: 3.8 g/dL (ref 3.5–5.0)
Alkaline Phosphatase: 81 U/L (ref 38–126)
Anion gap: 8 (ref 5–15)
BUN: 18 mg/dL (ref 8–23)
CO2: 29 mmol/L (ref 22–32)
Calcium: 9.6 mg/dL (ref 8.9–10.3)
Chloride: 105 mmol/L (ref 98–111)
Creatinine, Ser: 1.64 mg/dL — ABNORMAL HIGH (ref 0.61–1.24)
GFR, Estimated: 42 mL/min — ABNORMAL LOW (ref >60)
Glucose, Bld: 123 mg/dL — ABNORMAL HIGH (ref 70–99)
Potassium: 4.3 mmol/L (ref 3.5–5.1)
Sodium: 142 mmol/L (ref 135–145)
Total Bilirubin: 0.8 mg/dL (ref 0.3–1.2)
Total Protein: 7 g/dL (ref 6.5–8.1)

## 2022-02-03 LAB — CBC WITH DIFFERENTIAL/PLATELET
Abs Immature Granulocytes: 0.03 10*3/uL (ref 0.00–0.07)
Basophils Absolute: 0 10*3/uL (ref 0.0–0.1)
Basophils Relative: 0 %
Eosinophils Absolute: 0 10*3/uL (ref 0.0–0.5)
Eosinophils Relative: 0 %
HCT: 44.1 % (ref 39.0–52.0)
Hemoglobin: 15.4 g/dL (ref 13.0–17.0)
Immature Granulocytes: 0 %
Lymphocytes Relative: 16 %
Lymphs Abs: 1.4 10*3/uL (ref 0.7–4.0)
MCH: 27 pg (ref 26.0–34.0)
MCHC: 34.9 g/dL (ref 30.0–36.0)
MCV: 77.4 fL — ABNORMAL LOW (ref 80.0–100.0)
Monocytes Absolute: 0.6 10*3/uL (ref 0.1–1.0)
Monocytes Relative: 7 %
Neutro Abs: 6.8 10*3/uL (ref 1.7–7.7)
Neutrophils Relative %: 77 %
Platelets: 121 10*3/uL — ABNORMAL LOW (ref 150–400)
RBC: 5.7 MIL/uL (ref 4.22–5.81)
RDW: 14.7 % (ref 11.5–15.5)
WBC: 8.9 10*3/uL (ref 4.0–10.5)
nRBC: 0 % (ref 0.0–0.2)

## 2022-02-03 MED ORDER — ACETAMINOPHEN 325 MG PO TABS
975.0000 mg | ORAL_TABLET | Freq: Once | ORAL | Status: AC
  Administered 2022-02-03: 975 mg via ORAL

## 2022-02-03 MED ORDER — CIPROFLOXACIN HCL 500 MG PO TABS: 500.0000 mg | ORAL_TABLET | Freq: Two times a day (BID) | ORAL | 0 refills | Status: AC

## 2022-02-03 MED ORDER — ACETAMINOPHEN 325 MG PO TABS
ORAL_TABLET | ORAL | Status: AC
  Filled 2022-02-03: qty 3

## 2022-02-03 NOTE — ED Triage Notes (Addendum)
Over the last few days has been waking up frequently at night going to the bathroom. Began noticing some blood in urine. Reports difficulties with bowel movements also. Hx of enlarged prostate in the past, as well has prostate cancer 20ish years ago per patient. Denies fever, nausea, vomiting, abdominal pain. Also reports that he has had some instances of near fecal incontinence.

## 2022-02-03 NOTE — Discharge Instructions (Addendum)
Your symptoms are likely because of swelling and inflammation/infection to your prostate.  I would like for you to start taking ciprofloxacin by mouth every 12 hours (in breakfast and at dinnertime) for the next 5 days to treat this likely infection.  I have sent your urine for culture. We drew blood work today. I will call if any of your blood work or urine culture is abnormal or requires change in treatment plan.  I would like for you to call [facility] to schedule an appointment for soon as possible for follow-up.  If any of your symptoms worsen, or if you develop any new symptoms, I would like for you to go to the ER.  This includes if you begin to have a higher fever, heart palpitations, dizziness, back pain, abdominal pain, nausea, vomiting, or diarrhea.  Otherwise, call your primary care provider and [facility] to schedule a follow-up appointment.  Start taking your blood pressure medicine daily.  Take amlodipine 5 mg daily.  Take valsartan 160 mg daily.  Take carvedilol 12.5 mg daily.  Take these medicines when you go home so that your blood pressure can get better.  Avoid salt as this will make your blood pressure higher. Schedule an appointment for follow-up with your primary care provider in the next 1 to 2 weeks to discuss blood pressure medications further.

## 2022-02-03 NOTE — ED Provider Notes (Addendum)
[place]    CSN: [id number] Arrival date & time: 02/03/22  1116      History   Chief Complaint Chief Complaint  Patient presents with   Hematuria   Urinary Frequency    HPI John [name] is a 81 y.o. male.   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  He states he has been getting up at night over the last 2 days at least 10 times each night to use the restroom.  He experiences hematuria intermittently and reports significant straining with urination with incomplete bladder emptying after urinating.  Denies nausea, vomiting, fever/chills, dizziness, abdominal pain, diarrhea, constipation, and flank pain.  No heart palpitations, headache, blurry vision, confusion, vision changes, or decreased oral intake over the last few days since becoming ill.  Reports urgency with defecation over the last couple of nights but has not had any incontinence of bowels.  No recent antibiotic use or blood/mucus to the stools.  Denies dysuria, recent medication changes, pain with defecation, and history of kidney stones.  Elevated temp in clinic at 100.1.  Blood pressure is also noticeably elevated at 219/112.  Patient states he has been taking his Lasix, carvedilol, amlodipine, and valsartan intermittently and has not been taking these medications regularly as prescribed by providers due to frequency of urination when taking Lasix.  Has not had any of his blood pressure medications today.  He is a former cigarette smoker (7.5 pack years), quit in 1971. No history of bladder cancer. Has not had appointment with urologist in "a long time". Takes    Hematuria  Urinary Frequency    Past Medical History:  Diagnosis Date   Allergic rhinitis    Allergy    Anxiety    no per pt     Aortic atherosclerosis (HCC)    Back pain    BPH (benign prostatic hyperplasia)    CHF (congestive heart failure) (HCC)    Chronic kidney disease    Glaucoma    Gynecomastia    Hemorrhoids    HLD (hyperlipidemia)    Hx of adenomatous colonic polyps    Hypercholesteremia    Hyperplasia of prostate with urinary obstruction    Hypertension NO MED--  WAS TAKES CADURA FOR  BOTH BP AND PROSTATE ---  WAS TOLD TO STOP BY DR Alinda Money  PER WIFE   Overweight    PAD (peripheral artery disease) ([place])    Prediabetes    Premature ventricular contractions    Prostate cancer ([place]) INITIAL DX STAGE T1c Nx Mx  ADENOCARCINOMA JAN 2003 (PT DECIDED TO PROCEED W/ SURVEILLANCE)--  FOLLOWED BY DR LES [name] , UROLOGIST   PER PT AND WIFE ---  healed with holistic meds--  BY [place]  MD UNTIL  RECENT SYMPTOMS OF URINARY RETENTION   [name] (small bowel obstruction) (HCC)    Seborrheic keratosis    Sickle cell trait (HCC)    Sleep apnea    "doesn't wear mask" (12/07/2015)   URI (upper respiratory infection)    Urinary retention     Patient Active Problem List   Diagnosis Date Noted   Hx [name] 02/28/2017   Chronic venous insufficiency 09/16/2014   Edema 09/16/2014   Abnormal EKG 08/26/2014   Preoperative cardiovascular examination 08/26/2014   Leg pain, left 02/23/2014   Obstructive uropathy 10/17/2011  UTI (urinary tract infection) 10/17/2011   Constipation 10/17/2011   Obstruction to urinary outflow 12/20/2010   Sickle cell trait ([place]) 06/21/2010   HYPERCHOLESTEROLEMIA, MILD 09/02/2008   MUSCLE SPASM, BACK 09/02/2008   Essential hypertension 01/22/2008   Cardiac arrhythmia 01/22/2008   PROSTATE CANCER 11/22/2006   ADENOMATOUS COLONIC POLYP 11/22/2006   Anxiety state 11/22/2006   HEMORRHOIDS 11/22/2006   IRRITABLE BOWEL SYNDROME 11/22/2006   GYNECOMASTIA 11/22/2006    Past Surgical History:  Procedure Laterality Date   ARM SURGERY  AGE 44   patient denies at preop appt of 05/20/2014      COLONOSCOPY     COLONOSCOPY W/ BIOPSIES AND POLYPECTOMY  ~ 2002   CYSTOSCOPY  01/03/2012   Procedure: CYSTOSCOPY FLEXIBLE;  Surgeon: Dutch Gray, MD;  Location: [facility]  Service: Urology;  Laterality: N/A;   CYSTOSCOPY N/A 03/27/2012   Procedure: CYSTOSCOPY;  Surgeon: Dutch Gray, MD;  Location: [facility];  Service: Urology;  Laterality: N/A;   CYSTOSCOPY N/A 10/04/2014   Procedure: CYSTOSCOPY FLEXIBLE;  Surgeon: Raynelle Bring, MD;  Location: [facility];  Service: Urology;  Laterality: N/A;   LAPAROTOMY N/A 12/10/2015   Procedure: EXPLORATORY LAPAROTOMY FOR [name];  Surgeon: Donnie Mesa, MD;  Location: [place];  Service: General;  Laterality: N/A;   POLYPECTOMY     PROSTATE BIOPSY  02/2001   repeat in 11/2002 Dr. Lottie Rater at [place]  01/03/2012   Procedure: BIOPSY TRANSRECTAL ULTRASONIC PROSTATE (TUBP);  Surgeon: Dutch Gray, MD;  Location: [facility];  Service: Urology;  Laterality: N/A;  45 MIN    PROSTATE BIOPSY N/A 10/04/2014   Procedure: BIOPSY TRANSRECTAL ULTRASONIC PROSTATE (TUBP);  Surgeon: Raynelle Bring, MD;  Location: [facility];  Service: Urology;  Laterality: N/A;   REFRACTIVE SURGERY     for glaucoma    TRANSURETHRAL RESECTION OF PROSTATE N/A 03/27/2012   Procedure: TRANSURETHRAL RESECTION OF THE PROSTATE WITH GYRUS INSTRUMENTS;  Surgeon: Dutch Gray, MD;  Location: [facility];  Service: Urology;  Laterality: N/A;       Home Medications    Prior to Admission medications   Medication Sig Start Date End Date Taking? Authorizing Provider  ciprofloxacin (CIPRO) 500 MG tablet Take 1 tablet (500 mg total) by mouth every 12 (twelve) hours for 7 days. 02/03/22 02/10/22 Yes Talbot Grumbling, FNP  amLODipine (NORVASC) 5 MG tablet Take 1 tablet (5 mg total) by mouth daily. 11/30/21   Pemberton, Greer Ee, MD  bimatoprost (LUMIGAN) 0.01 % SOLN Place 1 drop into both eyes at bedtime.    [provider]  brimonidine (ALPHAGAN) 0.2 % ophthalmic  solution 1 drop daily. 06/04/19   [provider]  carvedilol (COREG) 12.5 MG tablet TAKE 1 TABLET (12.5 MG TOTAL) BY MOUTH 2 (TWO) TIMES DAILY WITH A MEAL. 01/13/18   Noralee Space, MD  dorzolamide-timolol (COSOPT) 22.3-6.8 MG/ML ophthalmic solution 1 drop 2 (two) times daily. 06/04/19   [provider]  furosemide (LASIX) 40 MG tablet Take 1 tablet (40 mg total) by mouth daily as needed (lower extremity swelling). 11/30/21   Freada Bergeron, MD  latanoprost (XALATAN) 0.005 % ophthalmic solution SMARTSIG:1 Drop(s) In Eye(s) Every Evening 06/04/19   [provider]  Multiple Vitamin (MULTIVITAMIN) tablet Take 1 tablet by mouth daily.    [provider]  potassium chloride SA (KLOR-CON M) 20 MEQ tablet Take 1 tablet (20 mEq total) by mouth daily as needed (when you take your lasix.). 11/30/21  Freada Bergeron, MD  valsartan (DIOVAN) 160 MG tablet Take 1 tablet (160 mg total) by mouth daily. 11/30/21   Freada Bergeron, MD    Family History Family History  Problem Relation Age of Onset   Heart disease Father    Diabetes Father    Colon cancer Neg Hx    Colon polyps Neg Hx    Esophageal cancer Neg Hx    Rectal cancer Neg Hx    Stomach cancer Neg Hx     Social History Social History   Tobacco Use   Smoking status: Former    Packs/day: 0.50    Years: 15.00    Total pack years: 7.50    Types: Cigarettes    Quit date: 02/12/1969    Years since quitting: 53.0   Smokeless tobacco: Never  Substance Use Topics   Alcohol use: No    Alcohol/week: 0.0 standard drinks of alcohol    Comment: quit in 1980   Drug use: No    Comment: quit in 1980     Allergies   Amlodipine besylate and Tamsulosin   Review of Systems Review of Systems  Genitourinary:  Positive for frequency and hematuria.  Per HPI   Physical Exam Triage Vital Signs ED Triage Vitals  Enc Vitals Group     BP 02/03/22 1347 (!) 219/112     Pulse Rate 02/03/22 1347 (!)  103     Resp 02/03/22 1347 16     Temp 02/03/22 1347 100.1 F (37.8 C)     Temp Source 02/03/22 1347 Oral     SpO2 02/03/22 1347 96 %     Weight --      Height --      Head Circumference --      Peak Flow --      Pain Score 02/03/22 1345 0     Pain Loc --      Pain Edu? --      Excl. in [place]? --    No data found.  Updated Vital Signs BP (!) 164/111 (BP Location: Left Arm)   Pulse 88   Temp 97.6 F (36.4 C) (Oral)   Resp 16   SpO2 97%   Visual Acuity Right Eye Distance:   Left Eye Distance:   Bilateral Distance:    Right Eye Near:   Left Eye Near:    Bilateral Near:     Physical Exam Vitals and nursing note reviewed.  Constitutional:      Appearance: Normal appearance. He is not ill-appearing or toxic-appearing.  HENT:     Head: Normocephalic and atraumatic.     Right Ear: Hearing and external ear normal.     Left Ear: Hearing and external ear normal.     Nose: Nose normal.     Mouth/Throat:     Lips: Pink.     Mouth: Mucous membranes are moist.     Pharynx: No posterior oropharyngeal erythema.  Eyes:     General: Lids are normal. Vision grossly intact. Gaze aligned appropriately.     Extraocular Movements: Extraocular movements intact.     Conjunctiva/sclera: Conjunctivae normal.  Cardiovascular:     Rate and Rhythm: Normal rate and regular rhythm.     Heart sounds: Normal heart sounds, S1 normal and S2 normal.  Pulmonary:     Effort: Pulmonary effort is normal. No respiratory distress.     Breath sounds: Normal breath sounds and air entry.  Abdominal:     General: Bowel  sounds are normal.     Palpations: Abdomen is soft.     Tenderness: There is no abdominal tenderness. There is no right CVA tenderness, left CVA tenderness or guarding.  Musculoskeletal:     Cervical back: Neck supple.  Lymphadenopathy:     Cervical: No cervical adenopathy.  Skin:    General: Skin is warm and dry.     Capillary Refill: Capillary refill takes less than 2 seconds.      Findings: No rash.  Neurological:     General: No focal deficit present.     Mental Status: He is alert and oriented to person, place, and time. Mental status is at baseline.     Cranial Nerves: No dysarthria or facial asymmetry.  Psychiatric:        Mood and Affect: Mood normal.        Speech: Speech normal.        Behavior: Behavior normal.        Thought Content: Thought content normal.        Judgment: Judgment normal.      UC Treatments / Results  Labs (all labs ordered are listed, but only abnormal results are displayed) Labs Reviewed  URINE CULTURE  CBC WITH DIFFERENTIAL/PLATELET  COMPREHENSIVE METABOLIC PANEL  POCT URINALYSIS DIPSTICK, ED / UC    EKG   Radiology No results found.  Procedures Procedures (including critical care time)  Medications Ordered in UC Medications  acetaminophen (TYLENOL) tablet 975 mg (975 mg Oral Given 02/03/22 1424)    Initial Impression / Assessment and Plan / UC Course  I have reviewed the triage vital signs and the nursing notes.  Pertinent labs & imaging results that were available during my care of the patient were reviewed by me and considered in my medical decision making (see chart for details).   1.  Gross hematuria, acute prostatitis, essential hypertension Presentation is consistent with acute prostatitis.  Considered possible nephrolithiasis, acute complicated cystitis, and AKI etiology.  CBC and CMP drawn today to assess for blood levels and electrolyte abnormalities/kidney function due to patient's CKD stage III and hematuria.  Urinalysis remarkable for small bilirubin, trace ketones, large hemoglobin, greater than 300 protein, and small leukocytes.  Nitrite and glucose negative.  Specific gravity is 1.020.  He appears to be well-hydrated to physical exam and is nontoxic in appearance.  Will treat for acute prostatitis with ciprofloxacin twice daily for the next 7 days.  Strict ER return precautions discussed should he  develop any new or worsening symptoms.  Follow-up with [facility] in the next 1 to 2 weeks recommended for further and ongoing evaluation of symptoms.  Urine culture is pending.  Will update treatment plan if necessary based on urine culture.  Blood pressure elevated initially at the 200s over 110s.  Blood pressure reduced to 164/111 prior to discharge without intervention.  Pulse rate also reduced to 88 and temperature resolved after Tylenol 1000 mg to 97.6 F.  He may use Tylenol every 6 hours as needed at home for fever or chills.  Advised patient to take his blood pressure medications more consistently to prevent cardio and cerebrovascular problems.  Discussed physical exam and available lab work findings in clinic with patient.  Counseled patient regarding appropriate use of medications and potential side effects for all medications recommended or prescribed today. Discussed red flag signs and symptoms of worsening condition,when to call the PCP office, return to urgent care, and when to seek higher level of care in  the emergency department. Patient verbalizes understanding and agreement with plan. All questions answered. Patient discharged in stable condition.    Final Clinical Impressions(s) / UC Diagnoses   Final diagnoses:  Gross hematuria  Acute prostatitis  Essential hypertension     Discharge Instructions      Your symptoms are likely because of swelling and inflammation/infection to your prostate.  I would like for you to start taking ciprofloxacin by mouth every 12 hours (in breakfast and at dinnertime) for the next 5 days to treat this likely infection.  I have sent your urine for culture. We drew blood work today. I will call if any of your blood work or urine culture is abnormal or requires change in treatment plan.  I would like for you to call [facility] to schedule an appointment for soon as possible for follow-up.  If any of your symptoms worsen, or if  you develop any new symptoms, I would like for you to go to the ER.  This includes if you begin to have a higher fever, heart palpitations, dizziness, back pain, abdominal pain, nausea, vomiting, or diarrhea.  Otherwise, call your primary care provider and [facility] to schedule a follow-up appointment.  Start taking your blood pressure medicine daily.  Take amlodipine 5 mg daily.  Take valsartan 160 mg daily.  Take carvedilol 12.5 mg daily.  Take these medicines when you go home so that your blood pressure can get better.  Avoid salt as this will make your blood pressure higher. Schedule an appointment for follow-up with your primary care provider in the next 1 to 2 weeks to discuss blood pressure medications further.     ED Prescriptions     Medication Sig Dispense Auth. Provider   ciprofloxacin (CIPRO) 500 MG tablet Take 1 tablet (500 mg total) by mouth every 12 (twelve) hours for 7 days. 14 tablet Talbot Grumbling, FNP      PDMP not reviewed this encounter.   Talbot Grumbling, FNP 02/03/22 [place], [place], FNP 02/03/22 [phone]

## 2022-02-04 LAB — URINE CULTURE: Culture: NO GROWTH

## 2022-02-06 LAB — POCT URINALYSIS DIPSTICK, ED / UC
Glucose, UA: NEGATIVE mg/dL
Nitrite: NEGATIVE
Protein, ur: >=300 mg/dL — AB
Specific Gravity, Urine: 1.02 (ref 1.005–1.030)
Urobilinogen, UA: 1 mg/dL (ref 0.0–1.0)
pH: 7 (ref 5.0–8.0)

## 2022-02-22 ENCOUNTER — Encounter: Payer: Self-pay | Admitting: Podiatry

## 2022-02-22 ENCOUNTER — Other Ambulatory Visit: Payer: Self-pay | Admitting: Urology

## 2022-02-22 ENCOUNTER — Ambulatory Visit: Payer: Medicare HMO | Admitting: Podiatry

## 2022-02-22 DIAGNOSIS — M79674 Pain in right toe(s): Secondary | ICD-10-CM | POA: Diagnosis not present

## 2022-02-22 DIAGNOSIS — B351 Tinea unguium: Secondary | ICD-10-CM

## 2022-02-22 DIAGNOSIS — M79675 Pain in left toe(s): Secondary | ICD-10-CM

## 2022-02-22 DIAGNOSIS — C61 Malignant neoplasm of prostate: Secondary | ICD-10-CM

## 2022-02-22 DIAGNOSIS — L600 Ingrowing nail: Secondary | ICD-10-CM

## 2022-02-25 NOTE — Progress Notes (Deleted)
Cardiology Office Note:    Date:  02/25/2022   ID:  IZACK FRIX, DOB 29-Jun-1940, MRN [id number]  PCP:  Cipriano Mile, NP   [facility] Providers Cardiologist:  None { Click to update primary MD,subspecialty MD or APP then REFRESH:1}    Referring MD: Cipriano Mile, NP   Chief Complaint: ***  History of Present Illness:    John [name] is a *** 82 y.o. male with a hx of HTN, anxiety, HLD, CKD stage IIIb, sickle cell trait, OSA not on CPAP, IBS  Referred to cardiology and seen by Dr. Johney Frame on 11/30/2021 for chronic diastolic heart failure and CKD IIIb.  TTE 2017 with LVEF 70% with moderate LVH and G1 DD with no significant valve disease.  Repeat TTE 09/12/2021 with EF 50%, G1 DD per report (could not see echo report in our system). He reported an unusual heartbeat in the past ongoing for some time.  If he lies down on his left side he is able to feel the irregular heartbeats.  No associated chest pain, SOB, or lightheadedness. Frequent PACs on ECG at office visit. Recent development of mild ankle swelling and HCTZ was increased from 12.5 to 25 mg by PCP with some improvement. BP was elevated at 170/90, 162/91. He reported he was not aware he had kidney disease. Losartan was changed to valsartan 160 mg daily and HCTZ was changed to Lasix 40 mg daily, started on amlodipine 5 mg daily.  Coreg was continued at 25 mg BID. 3 day monitor ordered for palpitations. Was advised to return in 57 month with Pharm D for hypertension management.  Cardiac monitor 12/20/21 revealed predominant rhythm NSR with average HR 73 bpm, 2 runs of NSVT with longest lasting 11.1 seconds, 53 runs of SVT with longest lasting 16 beats, frequent SVE 24.2%, rare VE, no A-fib or significant pauses. Echo ordered for further evaluation of frequent ectopy and revealed LVEF 55 to 60%, G1 DD, normal RV, mild to moderate aortic valve regurgitation, mild dilatation of the aortic root measuring 41 mm.   Today, he is here for  3 month follow-up  *** denies chest pain, shortness of breath, lower extremity edema, fatigue, palpitations, melena, hematuria, hemoptysis, diaphoresis, weakness, presyncope, syncope, orthopnea, and PND.      Past Medical History:  Diagnosis Date   Allergic rhinitis    Allergy    Anxiety    no per pt    Aortic atherosclerosis (HCC)    Back pain    BPH (benign prostatic hyperplasia)    CHF (congestive heart failure) (HCC)    Chronic kidney disease    Glaucoma    Gynecomastia    Hemorrhoids    HLD (hyperlipidemia)    Hx of adenomatous colonic polyps    Hypercholesteremia    Hyperplasia of prostate with urinary obstruction    Hypertension NO MED--  WAS TAKES CADURA FOR  BOTH BP AND PROSTATE ---  WAS TOLD TO STOP BY DR Alinda Money  PER WIFE   Overweight    PAD (peripheral artery disease) (HCC)    Prediabetes    Premature ventricular contractions    Prostate cancer ([place]) INITIAL DX STAGE T1c Nx Mx  ADENOCARCINOMA JAN 2003 (PT DECIDED TO PROCEED W/ SURVEILLANCE)--  FOLLOWED BY DR LES [name] , UROLOGIST   PER PT AND WIFE ---  healed with holistic meds--  BY [place] [facility]  MD UNTIL  RECENT SYMPTOMS OF URINARY RETENTION   [name] (small bowel obstruction) (HCC)  Seborrheic keratosis    Sickle cell trait ([place])    Sleep apnea    "doesn't wear mask" (12/07/2015)   URI (upper respiratory infection)    Urinary retention     Past Surgical History:  Procedure Laterality Date   ARM SURGERY  AGE 59   patient denies at preop appt of 05/20/2014     COLONOSCOPY     COLONOSCOPY W/ BIOPSIES AND POLYPECTOMY  ~ 2002   CYSTOSCOPY  01/03/2012   Procedure: CYSTOSCOPY FLEXIBLE;  Surgeon: Dutch Gray, MD;  Location: [facility];  Service: Urology;  Laterality: N/A;   CYSTOSCOPY N/A 03/27/2012   Procedure: CYSTOSCOPY;  Surgeon: Dutch Gray, MD;  Location: [facility];  Service: Urology;  Laterality: N/A;   CYSTOSCOPY N/A 10/04/2014   Procedure: CYSTOSCOPY FLEXIBLE;  Surgeon: Raynelle Bring, MD;   Location: [facility];  Service: Urology;  Laterality: N/A;   LAPAROTOMY N/A 12/10/2015   Procedure: EXPLORATORY LAPAROTOMY FOR [name];  Surgeon: Donnie Mesa, MD;  Location: [place];  Service: General;  Laterality: N/A;   POLYPECTOMY     PROSTATE BIOPSY  02/2001   repeat in 11/2002 Dr. Lottie Rater at [place]  01/03/2012   Procedure: BIOPSY TRANSRECTAL ULTRASONIC PROSTATE (TUBP);  Surgeon: Dutch Gray, MD;  Location: [facility];  Service: Urology;  Laterality: N/A;  45 MIN    PROSTATE BIOPSY N/A 10/04/2014   Procedure: BIOPSY TRANSRECTAL ULTRASONIC PROSTATE (TUBP);  Surgeon: Raynelle Bring, MD;  Location: [facility];  Service: Urology;  Laterality: N/A;   REFRACTIVE SURGERY     for glaucoma    TRANSURETHRAL RESECTION OF PROSTATE N/A 03/27/2012   Procedure: TRANSURETHRAL RESECTION OF THE PROSTATE WITH GYRUS INSTRUMENTS;  Surgeon: Dutch Gray, MD;  Location: [facility];  Service: Urology;  Laterality: N/A;    Current Medications: No outpatient medications have been marked as taking for the 02/28/22 encounter (Appointment) with Ann Maki, Lanice Schwab, NP.   Current Facility-Administered Medications for the 02/28/22 encounter (Appointment) with Lizbet Cirrincione, Lanice Schwab, NP  Medication   triamcinolone acetonide (KENALOG) 10 MG/ML injection 10 mg     Allergies:   Amlodipine besylate and Tamsulosin   Social History   Socioeconomic History   Marital status: Married    Spouse name: brenda Quickel   Number of children: 1   Years of education: Not on file   Highest education level: Not on file  Occupational History   Not on file  Tobacco Use   Smoking status: Former    Packs/day: 0.50    Years: 15.00    Total pack years: 7.50    Types: Cigarettes    Quit date: 02/12/1969    Years since quitting: 53.0   Smokeless tobacco: Never  Substance and Sexual Activity   Alcohol use: No    Alcohol/week: 0.0 standard drinks of alcohol    Comment: quit in 1980   Drug use: No    Comment: quit in  1980   Sexual activity: Yes  Other Topics Concern   Not on file  Social History Narrative   Not on file   Social Determinants of Health   Financial Resource Strain: Not on file  Food Insecurity: Not on file  Transportation Needs: Not on file  Physical Activity: Not on file  Stress: Not on file  Social Connections: Not on file     Family History: The patient's ***family history includes Diabetes in his father; Heart disease in his father. There is no history of Colon cancer,  Colon polyps, Esophageal cancer, Rectal cancer, or Stomach cancer.  ROS:   Please see the history of present illness.    *** All other systems reviewed and are negative.  Labs/Other Studies Reviewed:    The following studies were reviewed today:  Echo 12/25/21 1. Left ventricular ejection fraction, by estimation, is 55 to 60%. Left  ventricular ejection fraction by 3D volume is 57 %. The left ventricle has  normal function. The left ventricle has no regional wall motion  abnormalities. There is mild left  ventricular hypertrophy. Left ventricular diastolic parameters are  consistent with Grade I diastolic dysfunction (impaired relaxation).   2. Right ventricular systolic function is normal. The right ventricular  size is normal.   3. Left atrial size was mildly dilated.   4. The mitral valve is normal in structure. No evidence of mitral valve  regurgitation. No evidence of mitral stenosis.   5. The aortic valve is tricuspid. Aortic valve regurgitation is mild to  moderate. No aortic stenosis is present.   6. Aortic dilatation noted. There is mild dilatation of the aortic root,  measuring 41 mm.   7. The inferior vena cava is normal in size with greater than 50%  respiratory variability, suggesting right atrial pressure of 3 mmHg.   Cardiac Monitor 12/20/21 Patch wear time was 3 days   Predominant rhythm was NSR with average HR 73bpm   2 runs of NSVT with longest lating 11.1 seconds at rate 115bpm    53 runs of SVT with longest lasting 16 beats   Frequent SVE 24.2%, rare VE (<1%)   No Afib or significant pauses  Recent Labs: 02/03/2022: ALT 14; BUN 18; Creatinine, Ser 1.64; Hemoglobin 15.4; Platelets 121; Potassium 4.3; Sodium 142  Recent Lipid Panel    Component Value Date/Time   CHOL 180 08/29/2017 1059   TRIG 54.0 08/29/2017 1059   HDL 49.40 08/29/2017 1059   CHOLHDL 4 08/29/2017 1059   VLDL 10.8 08/29/2017 1059   LDLCALC 119 (H) 08/29/2017 1059   LDLDIRECT 140.5 01/23/2008 0926     Risk Assessment/Calculations:   {Does this patient have ATRIAL FIBRILLATION?[phone]}       Physical Exam:    VS:  There were no vitals taken for this visit.    Wt Readings from Last 3 Encounters:  11/30/21 239 lb (108.4 kg)  06/23/19 235 lb (106.6 kg)  06/09/19 235 lb (106.6 kg)     GEN: *** Well nourished, well developed in no acute distress HEENT: Normal NECK: No JVD; No carotid bruits CARDIAC: ***RRR, no murmurs, rubs, gallops RESPIRATORY:  Clear to auscultation without rales, wheezing or rhonchi  ABDOMEN: Soft, non-tender, non-distended MUSCULOSKELETAL:  No edema; No deformity. *** pedal pulses, ***bilaterally SKIN: Warm and dry NEUROLOGIC:  Alert and oriented x 3 PSYCHIATRIC:  Normal affect   EKG:  EKG is *** ordered today.  The ekg ordered today demonstrates ***  No BP recorded.  {Refresh Note OR Click here to enter BP  :1}***    Diagnoses:    No diagnosis found. Assessment and Plan:     Hypertension:  Chronic HFpEF: Echo 12/25/21 LVEF 55-60%, G1DD, mild LVH.   Aortic regurgitation: Mild to moderate aortic regurgitation on echo 12/25/2021.  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 Plan for yearly imaging. Precautions reviewed  {Are you ordering a CV Procedure (e.g. stress test, cath, DCCV, TEE, etc)?   Press F2        :  [id number]   Disposition:  Medication Adjustments/Labs and Tests Ordered: Current  medicines are reviewed at length with the patient today.  Concerns regarding medicines are outlined above.  No orders of the defined types were placed in this encounter.  No orders of the defined types were placed in this encounter.   There are no Patient Instructions on file for this visit.   Signed, Emmaline Life, NP  02/25/2022 3:06 PM    [place]

## 2022-02-26 ENCOUNTER — Other Ambulatory Visit: Payer: Self-pay

## 2022-02-26 DIAGNOSIS — R002 Palpitations: Secondary | ICD-10-CM

## 2022-02-26 DIAGNOSIS — E782 Mixed hyperlipidemia: Secondary | ICD-10-CM

## 2022-02-26 DIAGNOSIS — N1832 Chronic kidney disease, stage 3b: Secondary | ICD-10-CM

## 2022-02-26 DIAGNOSIS — Z79899 Other long term (current) drug therapy: Secondary | ICD-10-CM

## 2022-02-26 DIAGNOSIS — I5033 Acute on chronic diastolic (congestive) heart failure: Secondary | ICD-10-CM

## 2022-02-26 DIAGNOSIS — I10 Essential (primary) hypertension: Secondary | ICD-10-CM

## 2022-02-26 MED ORDER — POTASSIUM CHLORIDE CRYS ER 20 MEQ PO TBCR: 20.0000 meq | EXTENDED_RELEASE_TABLET | Freq: Every day | ORAL | 9 refills | Status: AC | PRN

## 2022-02-26 MED ORDER — FUROSEMIDE 40 MG PO TABS: 40.0000 mg | ORAL_TABLET | Freq: Every day | ORAL | 9 refills | Status: AC | PRN

## 2022-02-26 NOTE — Progress Notes (Signed)
  Subjective:  Patient ID: John [name], male    DOB: [birth year],  MRN: [id number]  Chief Complaint  Patient presents with   Nail Problem    (np) ingrown toenail-bil great    82 y.o. male presents with the above complaint. History confirmed with patient.  Nails are thick and elongated causing pain, the worst is the inside edge of the right great toe feels gets ingrown  Objective:  Physical Exam: warm, good capillary refill, no trophic changes or ulcerative lesions, normal DP and PT pulses, normal sensory exam, and distal ingrown of right hallux nail. Left Foot: dystrophic yellowed discolored nail plates with subungual debris Right Foot: dystrophic yellowed discolored nail plates with subungual debris   Assessment:   1. Pain due to onychomycosis of toenails of both feet   2. Ingrowing right great toenail      Plan:  Patient was evaluated and treated and all questions answered.  Discussed the etiology and treatment options for the condition in detail with the patient. Educated patient on the topical and oral treatment options for mycotic nails. Recommended debridement of the nails today. Sharp and mechanical debridement performed of all painful and mycotic nails today. Nails debrided in length and thickness using a nail nipper to level of comfort. Discussed treatment options including appropriate shoe gear. Follow up as needed for painful nails.  As part of this the medial border of the ingrowing part of the great toenail on the right side was debrided in a slant back fashion which alleviated pain and pressure.  Discussed the possibility of a partial permanent matricectomy.  Will reevaluate next visit if stable until then we will transition to routine regular care, otherwise can proceed with partial permanent matricectomy at that point    Return in about 3 months (around 05/24/2022) for painful thick fungal nails.

## 2022-02-28 ENCOUNTER — Ambulatory Visit: Payer: Medicare Other | Admitting: Nurse Practitioner

## 2022-03-16 ENCOUNTER — Ambulatory Visit: Payer: Medicare HMO | Attending: Interventional Cardiology | Admitting: Pharmacist

## 2022-03-16 VITALS — BP 140/60 | HR 69

## 2022-03-16 DIAGNOSIS — I10 Essential (primary) hypertension: Secondary | ICD-10-CM

## 2022-03-16 MED ORDER — CARVEDILOL 25 MG PO TABS: 25.0000 mg | ORAL_TABLET | Freq: Two times a day (BID) | ORAL | 3 refills | Status: DC

## 2022-03-16 NOTE — Assessment & Plan Note (Signed)
Assessment: Blood pressure is above goal of <130/80 No home readings available Patients last scr was a little above baseline but was from ER when he had UTI/prostatitis  Plan: Check BMP today. Consider increasing valsartan vs amlodipine based on results Patient did not have his calendar to schedule follow up- will need to be scheduled on Monday when I call him with labs Patient to bring his home cuff with him to next visit for [place] to show him how to use Increase exercise to 4-5 days per week

## 2022-03-16 NOTE — Patient Instructions (Addendum)
I will call you on Monday with your lab results Please have your medications available so we can confirm your medications For now continue valsartan '160mg'$  daily, amlodipine '5mg'$  daily, carvedilol '25mg'$  twice a day and furosemide '40mg'$  as needed  Try to increase exercise to 4-5 days a week

## 2022-03-16 NOTE — Progress Notes (Signed)
Patient ID: EMAURI KRYGIER                 DOB: 1940/03/23                      MRN: [id number]      HPI: John [name] is a 82 y.o. male referred by Dr. Johney Frame to HTN clinic. PMH is significant for HTN, IBS, anxiety, OSA not on CPAP, HLD, CKD IIIB, and sickle cell trait. Patient was referred to PharmD clinic in Nov 2023 for medication management. He did not show up to his apt 11/30. Has been rescheduled to today.  At apt with Dr. Johney Frame 10/19 his blood pressure was 170/90. He was asked to buy a blood pressure machine and check at home. HCTZ was changed to lasix prn with KCL for edema. Losartan was changed to valsartan '160mg'$  daily.   Patient presents today to clinic. He forgot to bring his home cuff. Does not have his medications with him. He knows for the most part what he is taking, but not 100% confident. He states he is no longer taking HCTZ. Has not needed his furosemide in awhile. Thinks he is taking carvedilol '25mg'$  BID. He does not know how to use his home BP cuff, therefore is not checking at home. He goes to the [facility] 2-3 times per week. Denies dizziness, lightheadedness, headache, blurred vision, SOB or swelling. He does admit to 1 episode where he woke up and stood up to fast and felt dizzy for 2 hr.    Current HTN meds: valsartan '160mg'$  daily, amlodipine '5mg'$  daily, carvedilol '25mg'$  twice a day, furosemide '40mg'$   Previously tried:  BP goal: <130/80  Family History: The patient's family history includes Diabetes in his father; Heart disease in his father. There is no history of Colon cancer, Colon polyps, Esophageal cancer, Rectal cancer, or Stomach cancer.   Social History: no tobacco, no etoh  Diet: little tea Eats out often for dinner sometimes Rotisserie chicken Salad and sandwich, soup and sandwich  Exercise:  Walks - 2-3 times a week walks a miles and then rides the bike for 2-3 miles Some weights Gym/ health club routine includes light weights, stationary bike, and  walking on track .  Home BP readings:  none  Wt Readings from Last 3 Encounters:  11/30/21 239 lb (108.4 kg)  06/23/19 235 lb (106.6 kg)  06/09/19 235 lb (106.6 kg)   BP Readings from Last 3 Encounters:  03/16/22 (!) 140/60  02/03/22 (!) 164/111  11/30/21 (!) 162/91   Pulse Readings from Last 3 Encounters:  03/16/22 69  02/03/22 88  11/30/21 76    Renal function: CrCl cannot be calculated (Patient's most recent lab result is older than the maximum 21 days allowed.).  Past Medical History:  Diagnosis Date   Allergic rhinitis    Allergy    Anxiety    no per pt    Aortic atherosclerosis (HCC)    Back pain    BPH (benign prostatic hyperplasia)    CHF (congestive heart failure) (HCC)    Chronic kidney disease    Glaucoma    Gynecomastia    Hemorrhoids    HLD (hyperlipidemia)    Hx of adenomatous colonic polyps    Hypercholesteremia    Hyperplasia of prostate with urinary obstruction    Hypertension NO MED--  WAS TAKES CADURA FOR  BOTH BP AND PROSTATE ---  WAS TOLD TO STOP BY DR Alinda Money  PER  WIFE   Overweight    PAD (peripheral artery disease) ([place])    Prediabetes    Premature ventricular contractions    Prostate cancer ([place]) INITIAL DX STAGE T1c Nx Mx  ADENOCARCINOMA JAN 2003 (PT DECIDED TO PROCEED W/ SURVEILLANCE)--  FOLLOWED BY DR LES [name] , UROLOGIST   PER PT AND WIFE ---  healed with holistic meds--  BY [facility] [facility]  MD UNTIL  RECENT SYMPTOMS OF URINARY RETENTION   [name] (small bowel obstruction) (HCC)    Seborrheic keratosis    Sickle cell trait ([place])    Sleep apnea    "doesn't wear mask" (12/07/2015)   URI (upper respiratory infection)    Urinary retention     Current Outpatient Medications on File Prior to Visit  Medication Sig Dispense Refill   amLODipine (NORVASC) 5 MG tablet Take 1 tablet (5 mg total) by mouth daily. 90 tablet 1   bimatoprost (LUMIGAN) 0.01 % SOLN Place 1 drop into both eyes at bedtime.     brimonidine (ALPHAGAN) 0.2 % ophthalmic  solution 1 drop daily.     dorzolamide (TRUSOPT) 2 % ophthalmic solution Apply to eye.     Multiple Vitamin (MULTIVITAMIN) tablet Take 1 tablet by mouth daily.     sildenafil (VIAGRA) 100 MG tablet Take 100 mg by mouth as directed.     tamsulosin (FLOMAX) 0.4 MG CAPS capsule TAKE 1 CAPSULE BY MOUTH EVERY DAY 30 MINUTES AFTER EVENING MEAL     valsartan (DIOVAN) 160 MG tablet Take 1 tablet (160 mg total) by mouth daily. 90 tablet 1   furosemide (LASIX) 40 MG tablet Take 1 tablet (40 mg total) by mouth daily as needed (lower extremity swelling). (Patient not taking: Reported on 03/16/2022) 30 tablet 9   latanoprost (XALATAN) 0.005 % ophthalmic solution SMARTSIG:1 Drop(s) In Eye(s) Every Evening (Patient not taking: Reported on 03/16/2022)     potassium chloride SA (KLOR-CON M) 20 MEQ tablet Take 1 tablet (20 mEq total) by mouth daily as needed (when you take your lasix.). (Patient not taking: Reported on 03/16/2022) 30 tablet 9   Current Facility-Administered Medications on File Prior to Visit  Medication Dose Route Frequency Provider Last Rate Last Admin   triamcinolone acetonide (KENALOG) 10 MG/ML injection 10 mg  10 mg Other Once Regal, Norman S, DPM        Allergies  Allergen Reactions   Amlodipine Besylate Other (See Comments)    REACTION: causes nightmares   Tamsulosin Other (See Comments)     dizziness    Blood pressure (!) 140/60, pulse 69.   Assessment/Plan:  1. Hypertension -  Essential hypertension Assessment: Blood pressure is above goal of <130/80 No home readings available Patients last scr was a little above baseline but was from ER when he had UTI/prostatitis  Plan: Check BMP today. Consider increasing valsartan vs amlodipine based on results Patient did not have his calendar to schedule follow up- will need to be scheduled on Monday when I call him with labs Patient to bring his home cuff with him to next visit for [place] to show him how to use Increase exercise to 4-5 days  per week   Thank you  Ramond Dial, Pharm.D, BCPS, CPP [facility] [address], [place], [place] [address]  Phone: [phone]; Fax: [phone]

## 2022-03-17 LAB — BASIC METABOLIC PANEL
BUN/Creatinine Ratio: 14 (ref 10–24)
BUN: 20 mg/dL (ref 8–27)
CO2: 26 mmol/L (ref 20–29)
Calcium: 9.2 mg/dL (ref 8.6–10.2)
Chloride: 107 mmol/L — ABNORMAL HIGH (ref 96–106)
Creatinine, Ser: 1.38 mg/dL — ABNORMAL HIGH (ref 0.76–1.27)
Glucose: 98 mg/dL (ref 70–99)
Potassium: 3.7 mmol/L (ref 3.5–5.2)
Sodium: 144 mmol/L (ref 134–144)
eGFR: 52 mL/min/{1.73_m2} — ABNORMAL LOW (ref >59)

## 2022-03-19 ENCOUNTER — Telehealth: Payer: Self-pay | Admitting: Pharmacist

## 2022-03-19 MED ORDER — VALSARTAN 320 MG PO TABS: 320.0000 mg | ORAL_TABLET | Freq: Every day | ORAL | 3 refills | Status: DC

## 2022-03-19 NOTE — Telephone Encounter (Signed)
Scr is at baseline and stable.  Will increase valsartan to '320mg'$  daily. Called pt and LVM for him to call back. He needs to have f/u scheduled in office in 2-3 weeks as well.

## 2022-03-19 NOTE — Telephone Encounter (Signed)
Spoke with patient. Reviewed results of labs. Will increase valsartan to '320mg'$  daily. He was in his car so he couldn't look at his calendar. He will call be back this afternoon to schedule follow up.

## 2022-04-09 NOTE — Telephone Encounter (Signed)
Pt returned Melissa's call from 2 weeks ago. F/u HTN appt scheduled next week at pt and clinic next available time.

## 2022-04-18 ENCOUNTER — Ambulatory Visit: Payer: Medicare HMO | Attending: Cardiology | Admitting: Pharmacist

## 2022-04-18 VITALS — BP 160/68

## 2022-04-18 DIAGNOSIS — I10 Essential (primary) hypertension: Secondary | ICD-10-CM

## 2022-04-18 NOTE — Assessment & Plan Note (Signed)
Assessment:  Patient had not taken his BP medications this morning or last night. He normally takes his medications in the morning. He reports sometimes not taking his evening carvedilol.  Patient did not bring his home cuff today. Reports not checking at home much.  Denies dizziness, lightheadedness, blurry vision, headaches, SOB, NSAID use. Denies concerns with increased valsartan from prior visit.   Plan:  Discussed importance of taking all medications and why carvedilol needs to be taken twice daily.  Created a plan for taking medications:  Morning: 7-8 AM - valsartan, amlodipine, carvedilol; Dinner: 6 PM - carvedilol  Reviewed home BP technique and how to use home cuff  Lab work done today  Follow-up Wednesday, March 27th at 11:30 AM

## 2022-04-18 NOTE — Patient Instructions (Addendum)
Every morning when you get up take: Valsartan '320mg'$ , amlodipine '5mg'$  daily and carvedilol '25mg'$    With dinner:  Take carvedilol '25mg'$    Please start checking blood pressure at home  Summary of today's discussion  Your blood pressure goal is <130/80  To check your pressure at home you will need to:  1. Sit up in a chair, with feet flat on the floor and back supported. Do not cross your ankles or legs. 2. Rest your left arm so that the cuff is about heart level. If the cuff goes on your upper arm,  then just relax the arm on the table, arm of the chair or your lap. If you have a wrist cuff, we  suggest relaxing your wrist against your chest (think of it as Pledging the Flag with the  wrong arm).  3. Place the cuff snugly around your arm, about 1 inch above the crook of your elbow. The  cords should be inside the groove of your elbow.  4. Sit quietly, with the cuff in place, for about 5 minutes. After that 5 minutes press the power  button to start a reading. 5. Do not talk or move while the reading is taking place.  6. Record your readings on a sheet of paper. Although most cuffs have a memory, it is often  easier to see a pattern developing when the numbers are all in front of you.  7. You can repeat the reading after 1-3 minutes if it is recommended  Make sure your bladder is empty and you have not had caffeine or tobacco within the last 30 min  Always bring your blood pressure log with you to your appointments. If you have not brought your monitor in to be double checked for accuracy, please bring it to your next appointment.  You can find a list of validated (accurate) blood pressure cuffs at [employer]   Important lifestyle changes to control high blood pressure  Intervention  Effect on the BP  Lose extra pounds and watch your waistline Weight loss is one of the most effective lifestyle changes for controlling blood pressure. If you're overweight or obese, losing even a  small amount of weight can help reduce blood pressure. Blood pressure might go down by about 1 millimeter of mercury (mm Hg) with each kilogram (about 2.2 pounds) of weight lost.  Exercise regularly As a general goal, aim for at least 30 minutes of moderate physical activity every day. Regular physical activity can lower high blood pressure by about 5 to 8 mm Hg.  Eat a healthy diet Eating a diet rich in whole grains, fruits, vegetables, and low-fat dairy products and low in saturated fat and cholesterol. A healthy diet can lower high blood pressure by up to 11 mm Hg.  Reduce salt (sodium) in your diet Even a small reduction of sodium in the diet can improve heart health and reduce high blood pressure by about 5 to 6 mm Hg.  Limit alcohol One drink equals 12 ounces of beer, 5 ounces of wine, or 1.5 ounces of 80-proof liquor.  Limiting alcohol to less than one drink a day for women or two drinks a day for men can help lower blood pressure by about 4 mm Hg.   Please call me at [phone] with any questions.

## 2022-04-18 NOTE — Progress Notes (Signed)
Patient ID: John [name]                 DOB: 02-08-1941                      MRN: [id number]      HPI: John [name] is a 82 y.o. male referred by Dr. Johney [name] to HTN clinic. PMH is significant for HTN, IBS, anxiety, OSA not on CPAP, HLD, CKD IIIB, and sickle cell trait. Patient was referred to PharmD clinic in Nov 2023 for medication management. He did not show up to his apt 11/30.   At apt with Dr. Johney [name] 10/19 his blood pressure was 170/90. He was asked to buy a blood pressure machine and check at home. HCTZ was changed to lasix prn with KCL for edema. Losartan was changed to valsartan '160mg'$  daily.   In visit on 03/16/2022 patient forgot to bring his home cuff and did not have his medications with him. He stated he was no longer taking HCTZ and had not needed his furosemide in awhile. He thought he was taking carvedilol '25mg'$  BID. He was not checking his BP at home due to not knowing how to use his cuff. He reported one episode of orthostatic hypotension where he felt dizzy for 2 hours. Valsartan was increased to '320mg'$  03/19/2022 after BMP results.   In today'[name] visit patient reports no issues with increased valsartan. Patient reports not checking his BP at home because he doesn't know how to use the cuff his PCP gave him. He forgot to bring his home cuff today. Denies dizziness, lightheadedness, blurry vision, headaches, SOB, NSAID use. Patient confirmed medication list was correct - still has not needed furosemide. Patient has not taken his BP medications today or last night. He takes most of his medications in the morning. He reports oversleeping this morning and felt rushed. Last night he was tired. Reports he sometimes does not take his evening carvedilol due to feeling like he does not need it.    Current HTN meds: valsartan '320mg'$  daily, amlodipine '5mg'$  daily, carvedilol '25mg'$  twice a day, furosemide '40mg'$   Previously tried:  BP goal: <130/80  Family History: The patient'[name] family history  includes Diabetes in his father; Heart disease in his father. There is no history of Colon cancer, Colon polyps, Esophageal cancer, Rectal cancer, or Stomach cancer.   Social History: no tobacco, no etoh  Diet: little tea Eats out often for dinner sometimes Rotisserie chicken Salad and sandwich, soup and sandwich  Exercise:  Walks - 2-3 times a week walks a miles and then rides the bike for 2-3 miles Some weights Gym/ health club routine includes light weights, stationary bike, and walking on track .  Home BP readings:    Wt Readings from Last 3 Encounters:  11/30/21 239 lb (108.4 kg)  06/23/19 235 lb (106.6 kg)  06/09/19 235 lb (106.6 kg)   BP Readings from Last 3 Encounters:  04/18/22 (!) 160/68  03/16/22 (!) 140/60  02/03/22 (!) 164/111   Pulse Readings from Last 3 Encounters:  03/16/22 69  02/03/22 88  11/30/21 76    Renal function: CrCl cannot be calculated (Patient'[name] most recent lab result is older than the maximum 21 days allowed.).  Past Medical History:  Diagnosis Date   Allergic rhinitis    Allergy    Anxiety    no per pt    Aortic atherosclerosis (HCC)    Back pain    BPH (benign  prostatic hyperplasia)    CHF (congestive heart failure) (HCC)    Chronic kidney disease    Glaucoma    Gynecomastia    Hemorrhoids    HLD (hyperlipidemia)    Hx of adenomatous colonic polyps    Hypercholesteremia    Hyperplasia of prostate with urinary obstruction    Hypertension NO MED--  WAS TAKES CADURA FOR  BOTH BP AND PROSTATE ---  WAS TOLD TO STOP BY DR John [name]  PER WIFE   Overweight    PAD (peripheral artery disease) (HCC)    Prediabetes    Premature ventricular contractions    Prostate cancer ([place]) INITIAL DX STAGE T1c Nx Mx  ADENOCARCINOMA JAN 2003 (PT DECIDED TO PROCEED W/ SURVEILLANCE)--  FOLLOWED BY DR LES [name] , UROLOGIST   PER PT AND WIFE ---  healed with holistic meds--  BY [facility]  MD UNTIL  RECENT SYMPTOMS OF URINARY RETENTION   [name] (small bowel  obstruction) (HCC)    Seborrheic keratosis    Sickle cell trait (HCC)    Sleep apnea    "doesn't wear mask" (12/07/2015)   URI (upper respiratory infection)    Urinary retention     Current Outpatient Medications on File Prior to Visit  Medication Sig Dispense Refill   amLODipine (NORVASC) 5 MG tablet Take 1 tablet (5 mg total) by mouth daily. 90 tablet 1   bimatoprost (LUMIGAN) 0.01 % SOLN Place 1 drop into both eyes at bedtime.     brimonidine (ALPHAGAN) 0.2 % ophthalmic solution 1 drop daily.     carvedilol (COREG) 25 MG tablet Take 1 tablet (25 mg total) by mouth 2 (two) times daily. 180 tablet 3   dorzolamide (TRUSOPT) 2 % ophthalmic solution Apply to eye.     furosemide (LASIX) 40 MG tablet Take 1 tablet (40 mg total) by mouth daily as needed (lower extremity swelling). (Patient not taking: Reported on 03/16/2022) 30 tablet 9   latanoprost (XALATAN) 0.005 % ophthalmic solution SMARTSIG:1 Drop([name]) In Eye([name]) Every Evening (Patient not taking: Reported on 03/16/2022)     Multiple Vitamin (MULTIVITAMIN) tablet Take 1 tablet by mouth daily.     potassium chloride SA (KLOR-CON M) 20 MEQ tablet Take 1 tablet (20 mEq total) by mouth daily as needed (when you take your lasix.). (Patient not taking: Reported on 03/16/2022) 30 tablet 9   sildenafil (VIAGRA) 100 MG tablet Take 100 mg by mouth as directed.     tamsulosin (FLOMAX) 0.4 MG CAPS capsule TAKE 1 CAPSULE BY MOUTH EVERY DAY 30 MINUTES AFTER EVENING MEAL     valsartan (DIOVAN) 320 MG tablet Take 1 tablet (320 mg total) by mouth daily. 90 tablet 3   Current Facility-Administered Medications on File Prior to Visit  Medication Dose Route Frequency Provider Last Rate Last Admin   triamcinolone acetonide (KENALOG) 10 MG/ML injection 10 mg  10 mg Other Once Regal, John [name], DPM        Allergies  Allergen Reactions   Amlodipine Besylate Other (See Comments)    REACTION: causes nightmares   Tamsulosin Other (See Comments)     dizziness     Blood pressure (!) 160/68.   Assessment/Plan:  1. Hypertension -  Essential hypertension Assessment:  Patient had not taken his BP medications this morning or last night. He normally takes his medications in the morning. He reports sometimes not taking his evening carvedilol.  Patient did not bring his home cuff today. Reports not checking at home much.  Denies dizziness,  lightheadedness, blurry vision, headaches, SOB, NSAID use. Denies concerns with increased valsartan from prior visit.   Plan:  Discussed importance of taking all medications and why carvedilol needs to be taken twice daily.  Created a plan for taking medications:  Morning: 7-8 AM - valsartan, amlodipine, carvedilol; Dinner: 6 PM - carvedilol  Reviewed home BP technique and how to use home cuff  Lab work done today  Follow-up Wednesday, March 27th at 11:30 AM    Thank you  Wallene Huh, PharmD Candidate   Ramond Dial, Pharm.D, BCPS, CPP [facility] A Division of [facility] [address] [place], [place] [address]  Phone: [phone]; Fax: [phone]

## 2022-04-19 LAB — BASIC METABOLIC PANEL
BUN/Creatinine Ratio: 17 (ref 10–24)
BUN: 22 mg/dL (ref 8–27)
CO2: 25 mmol/L (ref 20–29)
Calcium: 9.3 mg/dL (ref 8.6–10.2)
Chloride: 104 mmol/L (ref 96–106)
Creatinine, Ser: 1.31 mg/dL — ABNORMAL HIGH (ref 0.76–1.27)
Glucose: 111 mg/dL — ABNORMAL HIGH (ref 70–99)
Potassium: 4.1 mmol/L (ref 3.5–5.2)
Sodium: 142 mmol/L (ref 134–144)
eGFR: 55 mL/min/{1.73_m2} — ABNORMAL LOW (ref >59)

## 2022-05-09 ENCOUNTER — Ambulatory Visit: Payer: Medicare HMO

## 2022-05-24 ENCOUNTER — Ambulatory Visit: Payer: Medicare HMO | Admitting: Podiatry

## 2022-05-24 DIAGNOSIS — M79675 Pain in left toe(s): Secondary | ICD-10-CM

## 2022-05-24 DIAGNOSIS — M79674 Pain in right toe(s): Secondary | ICD-10-CM

## 2022-05-24 DIAGNOSIS — B351 Tinea unguium: Secondary | ICD-10-CM

## 2022-05-27 NOTE — Progress Notes (Signed)
  Subjective:  Patient ID: John [name], male    DOB: January 10, 1941,  MRN: [id number]  Chief Complaint  Patient presents with   Nail Problem    Thick painful toenails, 3 month follow up    82 y.o. male presents with the above complaint. History confirmed with patient.  Nails are thick and elongated causing pain, the last debridement was quite helpful  Objective:  Physical Exam: warm, good capillary refill, no trophic changes or ulcerative lesions, normal DP and PT pulses, normal sensory exam, and distal ingrown of right hallux nail. Left Foot: dystrophic yellowed discolored nail plates with subungual debris Right Foot: dystrophic yellowed discolored nail plates with subungual debris   Assessment:   1. Pain due to onychomycosis of toenails of both feet      Plan:  Patient was evaluated and treated and all questions answered.  Discussed the etiology and treatment options for the condition in detail with the patient. Educated patient on the topical and oral treatment options for mycotic nails. Recommended debridement of the nails today. Sharp and mechanical debridement performed of all painful and mycotic nails today. Nails debrided in length and thickness using a nail nipper to level of comfort. Discussed treatment options including appropriate shoe gear. Follow up in 3 months or as needed for painful nails.     Return in about 3 months (around 08/23/2022) for painful thick fungal nails.

## 2022-06-12 ENCOUNTER — Ambulatory Visit: Payer: Medicare HMO | Attending: Student

## 2022-06-12 NOTE — Progress Notes (Deleted)
Patient ID: John [name]                 DOB: [birth year]                      MRN: [id number]      HPI: John [name] is a 82 y.o. male referred by Dr. Shari Prows to HTN clinic. PMH is significant for HTN, IBS, anxiety, OSA not on CPAP, HLD, CKD IIIB, and sickle cell trait. Patient was referred to PharmD clinic in Nov 2023 for medication management. He did not show up to his apt 11/30.   At apt with Dr. Shari Prows 10/19 his blood pressure was 170/90. He was asked to buy a blood pressure machine and check at home. HCTZ was changed to lasix prn with KCL for edema. Losartan was changed to valsartan 160mg  daily.   In visit on 03/16/2022 patient forgot to bring his home cuff and did not have his medications with him. He stated he was no longer taking HCTZ and had not needed his furosemide in awhile. He thought he was taking carvedilol 25mg  BID. He was not checking his BP at home due to not knowing how to use his cuff. He reported one episode of orthostatic hypotension where he felt dizzy for 2 hours. Valsartan was increased to 320mg  03/19/2022 after BMP results.   At last visit, patient was not checking his blood pressure at home because he did not know how to use his cuff, but he forgot to bring it with him. He also had not taken his medications the day of visit or night before. He also reported sometimes not taking his evening dose of carvedilol. Blood pressure was 160/68 in clinic  In today's visit patient reports no issues with increased valsartan. Patient reports not checking his BP at home because he doesn't know how to use the cuff his PCP gave him. He forgot to bring his home cuff today. Denies dizziness, lightheadedness, blurry vision, headaches, SOB, NSAID use. Patient confirmed medication list was correct - still has not needed furosemide. Patient has not taken his BP medications today or last night. He takes most of his medications in the morning. He reports oversleeping this morning and felt  rushed. Last night he was tired. Reports he sometimes does not take his evening carvedilol due to feeling like he does not need it.    Current HTN meds: valsartan 320mg  daily, amlodipine 5mg  daily, carvedilol 25mg  twice a day, furosemide 40mg   Previously tried:  BP goal: <130/80  Family History: The patient's family history includes Diabetes in his father; Heart disease in his father. There is no history of Colon cancer, Colon polyps, Esophageal cancer, Rectal cancer, or Stomach cancer.   Social History: no tobacco, no etoh  Diet: little tea Eats out often for dinner sometimes Rotisserie chicken Salad and sandwich, soup and sandwich  Exercise:  Walks - 2-3 times a week walks a miles and then rides the bike for 2-3 miles Some weights Gym/ health club routine includes light weights, stationary bike, and walking on track .  Home BP readings:    Wt Readings from Last 3 Encounters:  11/30/21 239 lb (108.4 kg)  06/23/19 235 lb (106.6 kg)  06/09/19 235 lb (106.6 kg)   BP Readings from Last 3 Encounters:  04/18/22 (!) 160/68  03/16/22 (!) 140/60  02/03/22 (!) 164/111   Pulse Readings from Last 3 Encounters:  03/16/22 69  02/03/22 88  11/30/21 76  Renal function: CrCl cannot be calculated (Patient's most recent lab result is older than the maximum 21 days allowed.).  Past Medical History:  Diagnosis Date   Allergic rhinitis    Allergy    Anxiety    no per pt    Aortic atherosclerosis (HCC)    Back pain    BPH (benign prostatic hyperplasia)    CHF (congestive heart failure) (HCC)    Chronic kidney disease    Glaucoma    Gynecomastia    Hemorrhoids    HLD (hyperlipidemia)    Hx of adenomatous colonic polyps    Hypercholesteremia    Hyperplasia of prostate with urinary obstruction    Hypertension NO MED--  WAS TAKES CADURA FOR  BOTH BP AND PROSTATE ---  WAS TOLD TO STOP BY DR Laverle Patter  PER WIFE   Overweight    PAD (peripheral artery disease) (HCC)    Prediabetes     Premature ventricular contractions    Prostate cancer (HCC) INITIAL DX STAGE T1c Nx Mx  ADENOCARCINOMA JAN 2003 (PT DECIDED TO PROCEED W/ SURVEILLANCE)--  FOLLOWED BY DR LES [name] , UROLOGIST   PER PT AND WIFE ---  healed with holistic meds--  BY [facility]  MD UNTIL  RECENT SYMPTOMS OF URINARY RETENTION   [name] (small bowel obstruction) (HCC)    Seborrheic keratosis    Sickle cell trait (HCC)    Sleep apnea    "doesn't wear mask" (12/07/2015)   URI (upper respiratory infection)    Urinary retention     Current Outpatient Medications on File Prior to Visit  Medication Sig Dispense Refill   amLODipine (NORVASC) 5 MG tablet Take 1 tablet (5 mg total) by mouth daily. 90 tablet 1   bimatoprost (LUMIGAN) 0.01 % SOLN Place 1 drop into both eyes at bedtime.     brimonidine (ALPHAGAN) 0.2 % ophthalmic solution 1 drop daily.     carvedilol (COREG) 25 MG tablet Take 1 tablet (25 mg total) by mouth 2 (two) times daily. 180 tablet 3   dorzolamide (TRUSOPT) 2 % ophthalmic solution Apply to eye.     furosemide (LASIX) 40 MG tablet Take 1 tablet (40 mg total) by mouth daily as needed (lower extremity swelling). (Patient not taking: Reported on 03/16/2022) 30 tablet 9   latanoprost (XALATAN) 0.005 % ophthalmic solution SMARTSIG:1 Drop(s) In Eye(s) Every Evening (Patient not taking: Reported on 03/16/2022)     Multiple Vitamin (MULTIVITAMIN) tablet Take 1 tablet by mouth daily.     potassium chloride SA (KLOR-CON M) 20 MEQ tablet Take 1 tablet (20 mEq total) by mouth daily as needed (when you take your lasix.). (Patient not taking: Reported on 03/16/2022) 30 tablet 9   sildenafil (VIAGRA) 100 MG tablet Take 100 mg by mouth as directed.     tamsulosin (FLOMAX) 0.4 MG CAPS capsule TAKE 1 CAPSULE BY MOUTH EVERY DAY 30 MINUTES AFTER EVENING MEAL     valsartan (DIOVAN) 320 MG tablet Take 1 tablet (320 mg total) by mouth daily. 90 tablet 3   Current Facility-Administered Medications on File Prior to Visit  Medication  Dose Route Frequency Provider Last Rate Last Admin   triamcinolone acetonide (KENALOG) 10 MG/ML injection 10 mg  10 mg Other Once Regal, Norman S, DPM        Allergies  Allergen Reactions   Amlodipine Besylate Other (See Comments)    REACTION: causes nightmares   Tamsulosin Other (See Comments)     dizziness    There were  no vitals taken for this visit.   Assessment/Plan:  1. Hypertension -  No problem-specific Assessment & Plan notes found for this encounter.     Thank you  Delena Serve, PharmD Candidate   Olene Floss, Pharm.D, BCPS, CPP [facility] A Division of [facility] [address], [place], [place] [address]  Phone: [phone]; Fax: [phone]

## 2022-06-15 ENCOUNTER — Telehealth: Payer: Self-pay | Admitting: Pharmacist

## 2022-06-15 NOTE — Telephone Encounter (Signed)
Patient missed his apt earlier this week. He did call and leave a VM prior to the apt that he was not going to be able to make it. Called pt and LVM to reschedule.

## 2022-07-21 ENCOUNTER — Emergency Department (HOSPITAL_COMMUNITY)
Admission: EM | Admit: 2022-07-21 | Discharge: 2022-07-22 | Disposition: A | Discharge status: Home / Self Care | Payer: Medicare HMO | Attending: Emergency Medicine | Admitting: Emergency Medicine

## 2022-07-21 ENCOUNTER — Other Ambulatory Visit: Payer: Self-pay

## 2022-07-21 ENCOUNTER — Encounter (HOSPITAL_COMMUNITY): Payer: Self-pay

## 2022-07-21 DIAGNOSIS — Y92410 Unspecified street and highway as the place of occurrence of the external cause: Secondary | ICD-10-CM | POA: Diagnosis not present

## 2022-07-21 DIAGNOSIS — R519 Headache, unspecified: Secondary | ICD-10-CM | POA: Insufficient documentation

## 2022-07-21 DIAGNOSIS — Z5321 Procedure and treatment not carried out due to patient leaving prior to being seen by health care provider: Secondary | ICD-10-CM | POA: Insufficient documentation
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
## 2022-07-22 NOTE — ED Notes (Signed)
Pt called X3 to go back to a room. Pt could not be found.  

## 2022-09-06 ENCOUNTER — Ambulatory Visit: Payer: Medicare HMO | Admitting: Podiatry

## 2022-09-06 DIAGNOSIS — B351 Tinea unguium: Secondary | ICD-10-CM | POA: Diagnosis not present

## 2022-09-06 DIAGNOSIS — M79674 Pain in right toe(s): Secondary | ICD-10-CM | POA: Diagnosis not present

## 2022-09-06 DIAGNOSIS — R600 Localized edema: Secondary | ICD-10-CM | POA: Diagnosis not present

## 2022-09-06 DIAGNOSIS — M79675 Pain in left toe(s): Secondary | ICD-10-CM
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
## 2022-10-01 ENCOUNTER — Emergency Department (HOSPITAL_COMMUNITY): Payer: Medicare HMO

## 2022-10-01 ENCOUNTER — Emergency Department (HOSPITAL_COMMUNITY)
Admission: EM | Admit: 2022-10-01 | Discharge: 2022-10-02 | Discharge status: Against Medical Advice | Payer: Medicare HMO | Attending: Emergency Medicine | Admitting: Emergency Medicine

## 2022-10-01 ENCOUNTER — Other Ambulatory Visit: Payer: Self-pay

## 2022-10-01 ENCOUNTER — Encounter (HOSPITAL_COMMUNITY): Payer: Self-pay

## 2022-10-01 DIAGNOSIS — I10 Essential (primary) hypertension: Secondary | ICD-10-CM | POA: Diagnosis not present

## 2022-10-01 DIAGNOSIS — Z5321 Procedure and treatment not carried out due to patient leaving prior to being seen by health care provider: Secondary | ICD-10-CM | POA: Insufficient documentation

## 2022-10-01 LAB — BASIC METABOLIC PANEL
Anion gap: 9 (ref 5–15)
BUN: 22 mg/dL (ref 8–23)
CO2: 25 mmol/L (ref 22–32)
Calcium: 8.9 mg/dL (ref 8.9–10.3)
Chloride: 106 mmol/L (ref 98–111)
Creatinine, Ser: 1.39 mg/dL — ABNORMAL HIGH (ref 0.61–1.24)
GFR, Estimated: 52 mL/min — ABNORMAL LOW (ref >60)
Glucose, Bld: 123 mg/dL — ABNORMAL HIGH (ref 70–99)
Potassium: 3.4 mmol/L — ABNORMAL LOW (ref 3.5–5.1)
Sodium: 140 mmol/L (ref 135–145)

## 2022-10-01 LAB — TROPONIN I (HIGH SENSITIVITY): Troponin I (High Sensitivity): 32 ng/L — ABNORMAL HIGH (ref <18)

## 2022-10-01 LAB — CBC
HCT: 41.3 % (ref 39.0–52.0)
Hemoglobin: 13.9 g/dL (ref 13.0–17.0)
MCH: 26.4 pg (ref 26.0–34.0)
MCHC: 33.7 g/dL (ref 30.0–36.0)
MCV: 78.5 fL — ABNORMAL LOW (ref 80.0–100.0)
Platelets: 111 10*3/uL — ABNORMAL LOW (ref 150–400)
RBC: 5.26 MIL/uL (ref 4.22–5.81)
RDW: 15.5 % (ref 11.5–15.5)
WBC: 4.6 10*3/uL (ref 4.0–10.5)
nRBC: 0 % (ref 0.0–0.2)

## 2022-10-01 NOTE — ED Triage Notes (Signed)
Pt reports his BP at home was reading 230/112. States he just didn't feel right which prompted him to check. Pt denies chest pain, headache, dizziness or nausea.

## 2022-10-02 ENCOUNTER — Emergency Department (HOSPITAL_COMMUNITY): Payer: Medicare HMO

## 2022-10-02 ENCOUNTER — Encounter (HOSPITAL_COMMUNITY): Payer: Self-pay

## 2022-10-02 ENCOUNTER — Observation Stay (HOSPITAL_COMMUNITY)
Admission: EM | Admit: 2022-10-02 | Discharge: 2022-10-04 | Disposition: A | Discharge status: Home / Self Care | Payer: Medicare HMO | Attending: Internal Medicine | Admitting: Internal Medicine

## 2022-10-02 DIAGNOSIS — I872 Venous insufficiency (chronic) (peripheral): Secondary | ICD-10-CM | POA: Diagnosis present

## 2022-10-02 DIAGNOSIS — Z79899 Other long term (current) drug therapy: Secondary | ICD-10-CM | POA: Insufficient documentation

## 2022-10-02 DIAGNOSIS — I5033 Acute on chronic diastolic (congestive) heart failure: Secondary | ICD-10-CM | POA: Diagnosis not present

## 2022-10-02 DIAGNOSIS — N1831 Chronic kidney disease, stage 3a: Secondary | ICD-10-CM | POA: Diagnosis not present

## 2022-10-02 DIAGNOSIS — Z8546 Personal history of malignant neoplasm of prostate: Secondary | ICD-10-CM | POA: Diagnosis not present

## 2022-10-02 DIAGNOSIS — I10 Essential (primary) hypertension: Secondary | ICD-10-CM | POA: Diagnosis present

## 2022-10-02 DIAGNOSIS — Z87891 Personal history of nicotine dependence: Secondary | ICD-10-CM | POA: Diagnosis not present

## 2022-10-02 DIAGNOSIS — I130 Hypertensive heart and chronic kidney disease with heart failure and stage 1 through stage 4 chronic kidney disease, or unspecified chronic kidney disease: Principal | ICD-10-CM | POA: Insufficient documentation

## 2022-10-02 DIAGNOSIS — N139 Obstructive and reflux uropathy, unspecified: Secondary | ICD-10-CM | POA: Diagnosis present

## 2022-10-02 DIAGNOSIS — I161 Hypertensive emergency: Secondary | ICD-10-CM

## 2022-10-02 LAB — RAPID URINE DRUG SCREEN, HOSP PERFORMED
Amphetamines: NOT DETECTED
Barbiturates: NOT DETECTED
Benzodiazepines: NOT DETECTED
Cocaine: NOT DETECTED
Opiates: NOT DETECTED
Tetrahydrocannabinol: NOT DETECTED

## 2022-10-02 LAB — CBC
HCT: 43.1 % (ref 39.0–52.0)
Hemoglobin: 14.9 g/dL (ref 13.0–17.0)
MCH: 27.1 pg (ref 26.0–34.0)
MCHC: 34.6 g/dL (ref 30.0–36.0)
MCV: 78.5 fL — ABNORMAL LOW (ref 80.0–100.0)
Platelets: 117 10*3/uL — ABNORMAL LOW (ref 150–400)
RBC: 5.49 MIL/uL (ref 4.22–5.81)
RDW: 15.5 % (ref 11.5–15.5)
WBC: 5.6 10*3/uL (ref 4.0–10.5)
nRBC: 0 % (ref 0.0–0.2)

## 2022-10-02 LAB — TROPONIN I (HIGH SENSITIVITY)
Troponin I (High Sensitivity): 28 ng/L — ABNORMAL HIGH (ref <18)
Troponin I (High Sensitivity): 28 ng/L — ABNORMAL HIGH (ref <18)

## 2022-10-02 LAB — BASIC METABOLIC PANEL
Anion gap: 11 (ref 5–15)
BUN: 20 mg/dL (ref 8–23)
CO2: 26 mmol/L (ref 22–32)
Calcium: 9.4 mg/dL (ref 8.9–10.3)
Chloride: 104 mmol/L (ref 98–111)
Creatinine, Ser: 1.36 mg/dL — ABNORMAL HIGH (ref 0.61–1.24)
GFR, Estimated: 53 mL/min — ABNORMAL LOW (ref >60)
Glucose, Bld: 104 mg/dL — ABNORMAL HIGH (ref 70–99)
Potassium: 3.6 mmol/L (ref 3.5–5.1)
Sodium: 141 mmol/L (ref 135–145)

## 2022-10-02 MED ORDER — HYDRALAZINE HCL 20 MG/ML IJ SOLN
10.0000 mg | Freq: Once | INTRAMUSCULAR | Status: AC
  Administered 2022-10-02: 10 mg via INTRAVENOUS
  Filled 2022-10-02: qty 1

## 2022-10-02 NOTE — ED Notes (Signed)
Pt name called for updated vitals, no response 

## 2022-10-02 NOTE — ED Notes (Signed)
Called pt 2X for 2nd trop no answer

## 2022-10-02 NOTE — ED Provider Notes (Signed)
[place] EMERGENCY DEPARTMENT AT [facility] Provider Note   CSN: [id number] Arrival date & time: 10/02/22  1557     History  Chief Complaint  Patient presents with   Hypertension   Abnormal ECG    John [name] is a 82 y.o. male.   Hypertension     82 year old male with medical history significant for HTN, HLD, PVCs, urinary retention, OSA, small bowel obstruction, CHF, CKD, PAD who presents to the emergency department with a chief complaint of hypertension.  The patient states that his son recently passed away from an MI 3 weeks ago in [place] and was in the ICU for period of time.  He has been under a lot of stress recently.  He has been intermittently compliant with his outpatient antihypertensives.  He initially presented to the emergency department at the urging of his PCP yesterday and left prior to being seen and was found to have an elevated troponin of 32.  He denies any current chest pain or shortness of breath.  He was encouraged to present back to the emergency department.  He states that he has not been taking his Coreg or valsartan for a few weeks now.  He denies any lower extremity swelling, no fever, chills, cough, chest pain or shortness of breath.  Headaches, focal numbness or weakness, facial droop.  Home Medications Prior to Admission medications   Medication Sig Start Date End Date Taking? Authorizing Provider  bimatoprost (LUMIGAN) 0.01 % SOLN Place 1 drop into both eyes at bedtime.   Yes [provider]  brimonidine (ALPHAGAN) 0.2 % ophthalmic solution Place 1 drop into both eyes daily. 06/04/19  Yes [provider]  carvedilol (COREG) 25 MG tablet Take 1 tablet (25 mg total) by mouth 2 (two) times daily. 03/16/22  Yes Meriam Sprague, MD  dorzolamide (TRUSOPT) 2 % ophthalmic solution Place 1 drop into both eyes 2 (two) times daily. 02/07/22  Yes [provider]  furosemide (LASIX) 40 MG tablet Take 1 tablet (40  mg total) by mouth daily as needed (lower extremity swelling). 02/26/22  Yes Meriam Sprague, MD  Multiple Vitamin (MULTIVITAMIN) tablet Take 1 tablet by mouth daily.   Yes [provider]  OVER THE COUNTER MEDICATION Take 1 capsule by mouth daily. Omega 3 Black   Yes [provider]  potassium chloride SA (KLOR-CON M) 20 MEQ tablet Take 1 tablet (20 mEq total) by mouth daily as needed (when you take your lasix.). 02/26/22  Yes Meriam Sprague, MD  sildenafil (VIAGRA) 100 MG tablet Take 100 mg by mouth as directed. 02/16/22  Yes [provider]  tamsulosin (FLOMAX) 0.4 MG CAPS capsule TAKE 1 CAPSULE BY MOUTH EVERY DAY 30 MINUTES AFTER EVENING MEAL 09/20/21  Yes [provider]  valsartan (DIOVAN) 320 MG tablet Take 1 tablet (320 mg total) by mouth daily. 03/19/22  Yes Meriam Sprague, MD      Allergies    Amlodipine besylate    Review of Systems   Review of Systems  All other systems reviewed and are negative.   Physical Exam Updated Vital Signs BP (!) 169/81   Pulse 68   Temp 97.9 F (36.6 C) (Oral)   Resp 15   Ht 6' 2.5" (1.892 m)   Wt 103.4 kg   SpO2 98%   BMI 28.88 kg/m  Physical Exam Vitals and nursing note reviewed.  Constitutional:      General: He is not in acute distress.  Appearance: He is well-developed.  HENT:     Head: Normocephalic and atraumatic.  Eyes:     Conjunctiva/sclera: Conjunctivae normal.  Cardiovascular:     Rate and Rhythm: Normal rate and regular rhythm.     Heart sounds: No murmur heard. Pulmonary:     Effort: Pulmonary effort is normal. No respiratory distress.     Breath sounds: Normal breath sounds.  Abdominal:     Palpations: Abdomen is soft.     Tenderness: There is no abdominal tenderness.  Musculoskeletal:        General: No swelling.     Cervical back: Neck supple.     Right lower leg: No edema.     Left lower leg: No edema.  Skin:    General: Skin is warm and dry.     Capillary  Refill: Capillary refill takes less than 2 seconds.  Neurological:     Mental Status: He is alert.     Cranial Nerves: No cranial nerve deficit.     Sensory: No sensory deficit.     Motor: No weakness.     Gait: Gait normal.  Psychiatric:        Mood and Affect: Mood normal.     ED Results / Procedures / Treatments   Labs (all labs ordered are listed, but only abnormal results are displayed) Labs Reviewed  BASIC METABOLIC PANEL - Abnormal; Notable for the following components:      Result Value   Glucose, Bld 104 (*)    Creatinine, Ser 1.36 (*)    GFR, Estimated 53 (*)    All other components within normal limits  CBC - Abnormal; Notable for the following components:   MCV 78.5 (*)    Platelets 117 (*)    All other components within normal limits  TROPONIN I (HIGH SENSITIVITY) - Abnormal; Notable for the following components:   Troponin I (High Sensitivity) 28 (*)    All other components within normal limits  TROPONIN I (HIGH SENSITIVITY) - Abnormal; Notable for the following components:   Troponin I (High Sensitivity) 28 (*)    All other components within normal limits  RAPID URINE DRUG SCREEN, HOSP PERFORMED    EKG EKG Interpretation Date/Time:  Tuesday October 02 2022 23:18:51 EDT Ventricular Rate:  63 PR Interval:  194 QRS Duration:  123 QT Interval:  516 QTC Calculation: 529 R Axis:   -74  Text Interpretation: Sinus rhythm Atrial premature complexes Probable left atrial enlargement Nonspecific IVCD with LAD Left ventricular hypertrophy Probable anterior infarct, age indeterminate Nonspecific ST abnormality Confirmed by Ernie Avena ([id number]) on 10/03/2022 12:12:57 AM  Radiology DG Chest Portable 1 View  Result Date: 10/02/2022 CLINICAL DATA:  Hypertensive crisis EXAM: PORTABLE CHEST 1 VIEW COMPARISON:  10/01/2022 FINDINGS: Lungs are clear. No pneumothorax or pleural effusion. Cardiac size within normal limits. No pneumothorax or pleural effusion. Mild eventration of  the right hemidiaphragm. No acute bone abnormality. IMPRESSION: 1. No active disease. Electronically Signed   By: Helyn Numbers M.D.   On: 10/02/2022 20:59   DG Chest 2 View  Result Date: 10/02/2022 CLINICAL DATA:  Chest x-ray 09/03/2017 EXAM: CHEST - 2 VIEW COMPARISON:  None Available. FINDINGS: The heart size and mediastinal contours are within normal limits. Both lungs are clear. The visualized skeletal structures are unremarkable. IMPRESSION: No active cardiopulmonary disease. Electronically Signed   By: Darliss Cheney M.D.   On: 10/02/2022 00:01    Procedures Procedures    Medications Ordered in ED Medications  nitroGLYCERIN (NITROGLYN) 2 % ointment 0.5 inch (has no administration in time range)  hydrALAZINE (APRESOLINE) injection 10 mg (10 mg Intravenous Given 10/02/22 2046)  hydrALAZINE (APRESOLINE) injection 10 mg (10 mg Intravenous Given 10/02/22 2312)    ED Course/ Medical Decision Making/ A&P Clinical Course as of 10/03/22 0015  Tue Oct 02, 2022  2011 Troponin I (High Sensitivity)(!): 28 [JL]  2114 BP(!): 203/96 [JL]  2114 BP(!): 183/92 [JL]    Clinical Course User Index [JL] Ernie Avena, MD                                 Medical Decision Making Amount and/or Complexity of Data Reviewed Labs: ordered. Decision-making details documented in ED Course. Radiology: ordered.  Risk Prescription drug management. Decision regarding hospitalization.    82 year old male with medical history significant for HTN, HLD, PVCs, urinary retention, OSA, small bowel obstruction, CHF, CKD, PAD who presents to the emergency department with a chief complaint of hypertension.  The patient states that his son recently passed away from an MI 3 weeks ago in [place] and was in the ICU for period of time.  He has been under a lot of stress recently.  He has been intermittently compliant with his outpatient antihypertensives.  He initially presented to the emergency department at the  urging of his PCP yesterday and left prior to being seen and was found to have an elevated troponin of 32.  He denies any current chest pain or shortness of breath.  He was encouraged to present back to the emergency department.  He states that he has not been taking his Coreg or valsartan for a few weeks now.  He denies any lower extremity swelling, no fever, chills, cough, chest pain or shortness of breath.  Headaches, focal numbness or weakness, facial droop.  On arrival, the patient was afebrile, not tachycardic or tachypneic, hypertensive BP 203/96, saturating 100% on room air.  Sinus rhythm noted on cardiac telemetry.  Patient presenting with uncontrolled hypertension in the setting of noncompliance outpatient with his home antihypertensive, recent stressors of a death in the family.  Blood pressure as high as 230s systolic at home.  Patient without vision changes, no focal neurologic deficits on exam.  Denies any chest pain or shortness of breath.  Initial troponin was positive yesterday prior to the patient leaving without being seen.  Patient has nonspecific EKG changes, no obvious STEMI.  Patient presenting with concern for hypertensive emergency.  No active chest pain or shortness of breath.  UDS negative, initial troponin elevated at 28, repeat flat at 28.  CBC without a leukocytosis or anemia and BMP with a creatinine at baseline at 1.36.  Patient was administered IV hydralazine with improvement in his blood pressure initially however he began to develop persistent hypertension with blood pressures in the 200s again and was administered IV hydralazine another time.  A chest x-ray performed revealed no evidence of pulmonary edema.  Low concern for ACS.  Concern for uncontrolled hypertension in the setting of medication noncompliance outpatient and recent stressors in his life.  Nitropaste applied after discussion with Dr. Imogene Burn.  I discussed plan for inpatient admission, Dr. Imogene Burn accepting.  Final  Clinical Impression(s) / ED Diagnoses Final diagnoses:  Hypertensive emergency    Rx / DC Orders ED Discharge Orders     None         Ernie Avena, MD 10/03/22 [phone]

## 2022-10-02 NOTE — ED Notes (Signed)
Per Dr. Silverio Lay, pt to take his home BP med

## 2022-10-02 NOTE — ED Triage Notes (Signed)
Pt arrives via EMS his doctor's office. Pt was seen last night and did not complete his workup. Pt was sent to the ED by his PCP due to EKG changes and continued elevated BP. Pt is AxOx4. Pt denies and symptoms at this time.

## 2022-10-02 NOTE — ED Provider Triage Note (Signed)
Emergency Medicine Provider Triage Evaluation Note  John [name] , a 82 y.o. male  was evaluated in triage.  Pt complains of hypertension.  Patient states that he is under a lot of stress right now.  He states that his son recently had MI and he has not been taking his blood pressure medicines.  He came in last night and had a troponin of 32.  He left without being seen.  He then follow-up with his PCP.  He was noted to have some sinus arrhythmia and left bundle branch block.  PCP sent him for further evaluation.  Patient states that he has not been taking his carvedilol and valsartan for several weeks now.  He states that he has a with him  Review of Systems  Positive: Hypertension Negative: Chest pain or shortness of breath   Physical Exam  BP (!) 203/96   Pulse 67   Temp 98.6 F (37 C)   Resp 18   Ht 6' 2.5" (1.892 m)   Wt 103.4 kg   SpO2 100%   BMI 28.88 kg/m  Gen:   Awake, no distress   Resp:  Normal effort  MSK:   Moves extremities without difficulty  Other:    Medical Decision Making  Medically screening exam initiated at 4:10 PM.  Appropriate orders placed.  John [name] was informed that the remainder of the evaluation will be completed by another provider, this initial triage assessment does not replace that evaluation, and the importance of remaining in the ED until their evaluation is complete.  John [name] is a 82 y.o. male here presenting with symptomatic hypertension.  I reviewed labs from yesterday and troponin is 32 likely from longstanding hypertension.  He has nonspecific EKG changes and has sinus arrhythmia but no obvious ischemic changes.  Will get a repeat troponin.  He has his home BP meds with him and I told him to take them while he waits in triage.  Anticipate that if his troponin is stable, he can follow-up with his PCP outpatient.    Charlynne Pander, MD 10/02/22 [phone]

## 2022-10-02 NOTE — ED Provider Notes (Incomplete)
[place] EMERGENCY DEPARTMENT AT [facility] Provider Note   CSN: [id number] Arrival date & time: 10/02/22  1557     History  Chief Complaint  Patient presents with  . Hypertension  . Abnormal ECG    John [name] is a 82 y.o. male.   Hypertension     82 year old male with medical history significant for HTN, HLD, PVCs, urinary retention, OSA, small bowel obstruction, CHF, CKD, PAD who presents to the emergency department with a chief complaint of hypertension.  The patient states that his son recently passed away from an MI 3 weeks ago in [place] and was in the ICU for period of time.  He has been under a lot of stress recently.  He has been intermittently compliant with his outpatient antihypertensives.  He initially presented to the emergency department at the urging of his PCP yesterday and left prior to being seen and was found to have an elevated troponin of 32.  He denies any current chest pain or shortness of breath.  He was encouraged to present back to the emergency department.  He states that he has not been taking his Coreg or valsartan for a few weeks now.  He denies any lower extremity swelling, no fever, chills, cough, chest pain or shortness of breath.  Headaches, focal numbness or weakness, facial droop.  Home Medications Prior to Admission medications   Medication Sig Start Date End Date Taking? Authorizing Provider  bimatoprost (LUMIGAN) 0.01 % SOLN Place 1 drop into both eyes at bedtime.   Yes [provider]  brimonidine (ALPHAGAN) 0.2 % ophthalmic solution Place 1 drop into both eyes daily. 06/04/19  Yes [provider]  carvedilol (COREG) 25 MG tablet Take 1 tablet (25 mg total) by mouth 2 (two) times daily. 03/16/22  Yes Meriam Sprague, MD  dorzolamide (TRUSOPT) 2 % ophthalmic solution Place 1 drop into both eyes 2 (two) times daily. 02/07/22  Yes [provider]  furosemide (LASIX) 40 MG tablet Take 1 tablet  (40 mg total) by mouth daily as needed (lower extremity swelling). 02/26/22  Yes Meriam Sprague, MD  Multiple Vitamin (MULTIVITAMIN) tablet Take 1 tablet by mouth daily.   Yes [provider]  OVER THE COUNTER MEDICATION Take 1 capsule by mouth daily. Omega 3 Black   Yes [provider]  potassium chloride SA (KLOR-CON M) 20 MEQ tablet Take 1 tablet (20 mEq total) by mouth daily as needed (when you take your lasix.). 02/26/22  Yes Meriam Sprague, MD  sildenafil (VIAGRA) 100 MG tablet Take 100 mg by mouth as directed. 02/16/22  Yes [provider]  tamsulosin (FLOMAX) 0.4 MG CAPS capsule TAKE 1 CAPSULE BY MOUTH EVERY DAY 30 MINUTES AFTER EVENING MEAL 09/20/21  Yes [provider]  valsartan (DIOVAN) 320 MG tablet Take 1 tablet (320 mg total) by mouth daily. 03/19/22  Yes Meriam Sprague, MD      Allergies    Amlodipine besylate    Review of Systems   Review of Systems  All other systems reviewed and are negative.   Physical Exam Updated Vital Signs BP (!) 169/81   Pulse 68   Temp 97.9 F (36.6 C) (Oral)   Resp 15   Ht 6' 2.5" (1.892 m)   Wt 103.4 kg   SpO2 98%   BMI 28.88 kg/m  Physical Exam Vitals and nursing note reviewed.  Constitutional:      General: He is not in acute distress.  Appearance: He is well-developed.  HENT:     Head: Normocephalic and atraumatic.  Eyes:     Conjunctiva/sclera: Conjunctivae normal.  Cardiovascular:     Rate and Rhythm: Normal rate and regular rhythm.     Heart sounds: No murmur heard. Pulmonary:     Effort: Pulmonary effort is normal. No respiratory distress.     Breath sounds: Normal breath sounds.  Abdominal:     Palpations: Abdomen is soft.     Tenderness: There is no abdominal tenderness.  Musculoskeletal:        General: No swelling.     Cervical back: Neck supple.     Right lower leg: No edema.     Left lower leg: No edema.  Skin:    General: Skin is warm and dry.     Capillary  Refill: Capillary refill takes less than 2 seconds.  Neurological:     Mental Status: He is alert.     Cranial Nerves: No cranial nerve deficit.     Sensory: No sensory deficit.     Motor: No weakness.     Gait: Gait normal.  Psychiatric:        Mood and Affect: Mood normal.     ED Results / Procedures / Treatments   Labs (all labs ordered are listed, but only abnormal results are displayed) Labs Reviewed  BASIC METABOLIC PANEL - Abnormal; Notable for the following components:      Result Value   Glucose, Bld 104 (*)    Creatinine, Ser 1.36 (*)    GFR, Estimated 53 (*)    All other components within normal limits  CBC - Abnormal; Notable for the following components:   MCV 78.5 (*)    Platelets 117 (*)    All other components within normal limits  TROPONIN I (HIGH SENSITIVITY) - Abnormal; Notable for the following components:   Troponin I (High Sensitivity) 28 (*)    All other components within normal limits  TROPONIN I (HIGH SENSITIVITY) - Abnormal; Notable for the following components:   Troponin I (High Sensitivity) 28 (*)    All other components within normal limits  RAPID URINE DRUG SCREEN, HOSP PERFORMED    EKG None  Radiology DG Chest Portable 1 View  Result Date: 10/02/2022 CLINICAL DATA:  Hypertensive crisis EXAM: PORTABLE CHEST 1 VIEW COMPARISON:  10/01/2022 FINDINGS: Lungs are clear. No pneumothorax or pleural effusion. Cardiac size within normal limits. No pneumothorax or pleural effusion. Mild eventration of the right hemidiaphragm. No acute bone abnormality. IMPRESSION: 1. No active disease. Electronically Signed   By: Helyn Numbers M.D.   On: 10/02/2022 20:59   DG Chest 2 View  Result Date: 10/02/2022 CLINICAL DATA:  Chest x-ray 09/03/2017 EXAM: CHEST - 2 VIEW COMPARISON:  None Available. FINDINGS: The heart size and mediastinal contours are within normal limits. Both lungs are clear. The visualized skeletal structures are unremarkable. IMPRESSION: No  active cardiopulmonary disease. Electronically Signed   By: Darliss Cheney M.D.   On: 10/02/2022 00:01    Procedures Procedures    Medications Ordered in ED Medications  hydrALAZINE (APRESOLINE) injection 10 mg (10 mg Intravenous Given 10/02/22 2046)  hydrALAZINE (APRESOLINE) injection 10 mg (10 mg Intravenous Given 10/02/22 2312)    ED Course/ Medical Decision Making/ A&P Clinical Course as of 10/02/22 2359  Tue Oct 02, 2022  2011 Troponin I (High Sensitivity)(!): 28 [JL]  2114 BP(!): 203/96 [JL]  2114 BP(!): 183/92 [JL]    Clinical Course User Index [  JL] Ernie Avena, MD                                 Medical Decision Making Amount and/or Complexity of Data Reviewed Labs: ordered. Decision-making details documented in ED Course. Radiology: ordered.  Risk Prescription drug management. Decision regarding hospitalization.    82 year old male with medical history significant for HTN, HLD, PVCs, urinary retention, OSA, small bowel obstruction, CHF, CKD, PAD who presents to the emergency department with a chief complaint of hypertension.  The patient states that his son recently passed away from an MI 3 weeks ago in [place] and was in the ICU for period of time.  He has been under a lot of stress recently.  He has been intermittently compliant with his outpatient antihypertensives.  He initially presented to the emergency department at the urging of his PCP yesterday and left prior to being seen and was found to have an elevated troponin of 32.  He denies any current chest pain or shortness of breath.  He was encouraged to present back to the emergency department.  He states that he has not been taking his Coreg or valsartan for a few weeks now.  He denies any lower extremity swelling, no fever, chills, cough, chest pain or shortness of breath.  Headaches, focal numbness or weakness, facial droop.  On arrival, the patient was afebrile, not tachycardic or tachypneic, hypertensive  BP 203/96, saturating 100% on room air.  Sinus rhythm noted on cardiac telemetry.  Patient presenting with uncontrolled hypertension in the setting of noncompliance outpatient with his home antihypertensive, recent stressors of a death in the family.  Blood pressure as high as 230s systolic at home.  Patient without vision changes, no focal neurologic deficits on exam.  Denies any chest pain or shortness of breath.  Initial troponin was positive yesterday prior to the patient leaving without being seen.  Patient has nonspecific EKG changes, no obvious STEMI  {Document critical care time when appropriate:1} {Document review of labs and clinical decision tools ie heart score, Chads2Vasc2 etc:1}  {Document your independent review of radiology images, and any outside records:1} {Document your discussion with family members, caretakers, and with consultants:1} {Document social determinants of health affecting pt's care:1} {Document your decision making why or why not admission, treatments were needed:1} Final Clinical Impression(s) / ED Diagnoses Final diagnoses:  Hypertensive emergency    Rx / DC Orders ED Discharge Orders     None

## 2022-10-03 ENCOUNTER — Observation Stay (HOSPITAL_BASED_OUTPATIENT_CLINIC_OR_DEPARTMENT_OTHER): Payer: Medicare HMO

## 2022-10-03 DIAGNOSIS — I872 Venous insufficiency (chronic) (peripheral): Secondary | ICD-10-CM | POA: Diagnosis not present

## 2022-10-03 DIAGNOSIS — N1831 Chronic kidney disease, stage 3a: Secondary | ICD-10-CM | POA: Diagnosis not present

## 2022-10-03 DIAGNOSIS — I351 Nonrheumatic aortic (valve) insufficiency: Secondary | ICD-10-CM

## 2022-10-03 DIAGNOSIS — N139 Obstructive and reflux uropathy, unspecified: Secondary | ICD-10-CM | POA: Diagnosis not present

## 2022-10-03 DIAGNOSIS — I10 Essential (primary) hypertension: Secondary | ICD-10-CM | POA: Diagnosis not present

## 2022-10-03 LAB — ECHOCARDIOGRAM COMPLETE
AV Vena cont: 0.3 cm
Area-P 1/2: 3.42 cm2
Calc EF: 48.8 %
Height: 74.5 in
P 1/2 time: 923 msec
S' Lateral: 3.7 cm
Single Plane A2C EF: 52.1 %
Single Plane A4C EF: 50.4 %
Weight: 3648 oz

## 2022-10-03 LAB — HEMOGLOBIN A1C
Hgb A1c MFr Bld: 6.2 % — ABNORMAL HIGH (ref 4.8–5.6)
Mean Plasma Glucose: 131.24 mg/dL

## 2022-10-03 MED ORDER — MELATONIN 5 MG PO TABS: 10.0000 mg | ORAL_TABLET | Freq: Every evening | ORAL | Status: DC | PRN

## 2022-10-03 MED ORDER — POTASSIUM CHLORIDE CRYS ER 20 MEQ PO TBCR
40.0000 meq | EXTENDED_RELEASE_TABLET | Freq: Once | ORAL | Status: AC
  Administered 2022-10-03: 40 meq via ORAL
  Filled 2022-10-03: qty 2

## 2022-10-03 MED ORDER — CARVEDILOL 12.5 MG PO TABS
12.5000 mg | ORAL_TABLET | Freq: Two times a day (BID) | ORAL | Status: DC
  Administered 2022-10-03 – 2022-10-04 (×4): 12.5 mg via ORAL
  Filled 2022-10-03 (×4): qty 1

## 2022-10-03 MED ORDER — ACETAMINOPHEN 325 MG PO TABS: 650.0000 mg | ORAL_TABLET | Freq: Four times a day (QID) | ORAL | Status: DC | PRN

## 2022-10-03 MED ORDER — IRBESARTAN 300 MG PO TABS
150.0000 mg | ORAL_TABLET | Freq: Every day | ORAL | Status: DC
  Administered 2022-10-03: 150 mg via ORAL
  Filled 2022-10-03: qty 1

## 2022-10-03 MED ORDER — HEPARIN SODIUM (PORCINE) 5000 UNIT/ML IJ SOLN: 5000.0000 [IU] | Freq: Three times a day (TID) | INTRAMUSCULAR | Status: DC

## 2022-10-03 MED ORDER — ONDANSETRON HCL 4 MG PO TABS: 4.0000 mg | ORAL_TABLET | Freq: Four times a day (QID) | ORAL | Status: DC | PRN

## 2022-10-03 MED ORDER — HEPARIN SODIUM (PORCINE) 5000 UNIT/ML IJ SOLN
5000.0000 [IU] | Freq: Three times a day (TID) | INTRAMUSCULAR | Status: DC
  Administered 2022-10-03 – 2022-10-04 (×3): 5000 [IU] via SUBCUTANEOUS
  Filled 2022-10-03 (×2): qty 1

## 2022-10-03 MED ORDER — ACETAMINOPHEN 650 MG RE SUPP: 650.0000 mg | Freq: Four times a day (QID) | RECTAL | Status: DC | PRN

## 2022-10-03 MED ORDER — ONDANSETRON HCL 4 MG/2ML IJ SOLN: 4.0000 mg | Freq: Four times a day (QID) | INTRAMUSCULAR | Status: DC | PRN

## 2022-10-03 MED ORDER — TAMSULOSIN HCL 0.4 MG PO CAPS
0.4000 mg | ORAL_CAPSULE | Freq: Every day | ORAL | Status: DC
  Filled 2022-10-03: qty 1

## 2022-10-03 MED ORDER — FUROSEMIDE 40 MG PO TABS
40.0000 mg | ORAL_TABLET | Freq: Every day | ORAL | Status: DC
  Administered 2022-10-03 – 2022-10-04 (×2): 40 mg via ORAL
  Filled 2022-10-03: qty 2
  Filled 2022-10-03: qty 1

## 2022-10-03 MED ORDER — NITROGLYCERIN 2 % TD OINT
0.5000 [in_us] | TOPICAL_OINTMENT | Freq: Once | TRANSDERMAL | Status: AC
  Administered 2022-10-03: 0.5 [in_us] via TOPICAL
  Filled 2022-10-03: qty 1

## 2022-10-03 NOTE — Assessment & Plan Note (Signed)
Observation med/tele bed. Restart his BP meds. Pt states his PCP told him today to reduce his coreg dose by half. Was previously on 25 mg bid. Pt has been off HTN meds for several years while his son(who died recently) was in the hospital. Pt does take Viagra occasionally for ED so long acting NTG is not an option for BP control. Continue with coreg 12.5 mg bid, lasix prn. Will reduce dose of diovan to 160 mg daily due to CKD. This can be uptitrated in the PCP office.
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
## 2022-10-03 NOTE — Progress Notes (Signed)
Echocardiogram 2D Echocardiogram has been performed.  John [name] 10/03/2022, 1:43 PM
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
## 2022-10-03 NOTE — Assessment & Plan Note (Signed)
Continue with prn lasix.

## 2022-10-03 NOTE — ED Notes (Signed)
ED TO INPATIENT HANDOFF REPORT  ED Nurse Name and Phone #: Dennison Mcdaid [phone]  S Name/Age/Gender John [name] 82 y.o. male Room/Bed: 043C/043C  Code Status   Code Status: Full Code  Home/SNF/Other Home Patient oriented to: self, place, time, and situation Is this baseline? Yes   Triage Complete: Triage complete  Chief Complaint Accelerated hypertension [I10]  Triage Note Pt arrives via EMS his doctor's office. Pt was seen last night and did not complete his workup. Pt was sent to the ED by his PCP due to EKG changes and continued elevated BP. Pt is AxOx4. Pt denies and symptoms at this time.    Allergies Allergies  Allergen Reactions   Amlodipine Besylate Other (See Comments)    REACTION: causes nightmares    Level of Care/Admitting Diagnosis ED Disposition     ED Disposition  Admit   Condition  --   Comment  Hospital Area: [facility] [[id number]]  Level of Care: Telemetry Medical [104]  May place patient in observation at [facility] or [facility] if equivalent level of care is available:: No  Covid Evaluation: Asymptomatic - no recent exposure (last 10 days) testing not required  Diagnosis: Accelerated hypertension [409811]  Admitting Physician: Imogene Burn, ERIC [[id number]]  Attending Physician: Imogene Burn, ERIC [[id number]]          B Medical/Surgery History Past Medical History:  Diagnosis Date   Allergic rhinitis    Allergy    Anxiety    no per pt    Aortic atherosclerosis (HCC)    Back pain    BPH (benign prostatic hyperplasia)    CHF (congestive heart failure) (HCC)    Chronic kidney disease    Glaucoma    Gynecomastia    Hemorrhoids    HLD (hyperlipidemia)    Hx of adenomatous colonic polyps    Hypercholesteremia    Hyperplasia of prostate with urinary obstruction    Hypertension NO MED--  WAS TAKES CADURA FOR  BOTH BP AND PROSTATE ---  WAS TOLD TO STOP BY DR Laverle Patter  PER WIFE   Overweight    PAD (peripheral artery disease) (HCC)    Prediabetes     Premature ventricular contractions    Prostate cancer (HCC) INITIAL DX STAGE T1c Nx Mx  ADENOCARCINOMA JAN 2003 (PT DECIDED TO PROCEED W/ SURVEILLANCE)--  FOLLOWED BY DR LES [name] , UROLOGIST   PER PT AND WIFE ---  healed with holistic meds--  BY [facility] [facility]  MD UNTIL  RECENT SYMPTOMS OF URINARY RETENTION   [name] (small bowel obstruction) (HCC)    Seborrheic keratosis    Sickle cell trait (HCC)    Sleep apnea    "doesn't wear mask" (12/07/2015)   URI (upper respiratory infection)    Urinary retention    Past Surgical History:  Procedure Laterality Date   ARM SURGERY  AGE 24   patient denies at preop appt of 05/20/2014     COLONOSCOPY     COLONOSCOPY W/ BIOPSIES AND POLYPECTOMY  ~ 2002   CYSTOSCOPY  01/03/2012   Procedure: CYSTOSCOPY FLEXIBLE;  Surgeon: Crecencio Mc, MD;  Location: [facility];  Service: Urology;  Laterality: N/A;   CYSTOSCOPY N/A 03/27/2012   Procedure: CYSTOSCOPY;  Surgeon: Crecencio Mc, MD;  Location: [facility];  Service: Urology;  Laterality: N/A;   CYSTOSCOPY N/A 10/04/2014   Procedure: CYSTOSCOPY FLEXIBLE;  Surgeon: Heloise Purpura, MD;  Location: [facility];  Service: Urology;  Laterality: N/A;   LAPAROTOMY N/A 12/10/2015  Procedure: EXPLORATORY LAPAROTOMY FOR [name];  Surgeon: Manus Rudd, MD;  Location: [facility];  Service: General;  Laterality: N/A;   POLYPECTOMY     PROSTATE BIOPSY  02/2001   repeat in 11/2002 Dr. Sinclair Grooms at [facility]   PROSTATE BIOPSY  01/03/2012   Procedure: BIOPSY TRANSRECTAL ULTRASONIC PROSTATE (TUBP);  Surgeon: Crecencio Mc, MD;  Location: [facility];  Service: Urology;  Laterality: N/A;  45 MIN    PROSTATE BIOPSY N/A 10/04/2014   Procedure: BIOPSY TRANSRECTAL ULTRASONIC PROSTATE (TUBP);  Surgeon: Heloise Purpura, MD;  Location: [facility];  Service: Urology;  Laterality: N/A;   REFRACTIVE SURGERY     for glaucoma    TRANSURETHRAL RESECTION OF PROSTATE N/A 03/27/2012   Procedure: TRANSURETHRAL RESECTION OF THE PROSTATE WITH GYRUS  INSTRUMENTS;  Surgeon: Crecencio Mc, MD;  Location: [facility];  Service: Urology;  Laterality: N/A;     A IV Location/Drains/Wounds Patient Lines/Drains/Airways Status     Active Line/Drains/Airways     Name Placement date Placement time Site Days   Peripheral IV 10/02/22 18 G Anterior;Distal;Left Forearm 10/02/22  2046  Forearm  1            Intake/Output Last 24 hours No intake or output data in the 24 hours ending 10/03/22 1323  Labs/Imaging Results for orders placed or performed during the hospital encounter of 10/02/22 (from the past 48 hour(s))  Basic metabolic panel     Status: Abnormal   Collection Time: 10/02/22  4:11 PM  Result Value Ref Range   Sodium 141 135 - 145 mmol/L   Potassium 3.6 3.5 - 5.1 mmol/L   Chloride 104 98 - 111 mmol/L   CO2 26 22 - 32 mmol/L   Glucose, Bld 104 (H) 70 - 99 mg/dL    Comment: Glucose reference range applies only to samples taken after fasting for at least 8 hours.   BUN 20 8 - 23 mg/dL   Creatinine, Ser 2.72 (H) 0.61 - 1.24 mg/dL   Calcium 9.4 8.9 - 53.6 mg/dL   GFR, Estimated 53 (L) >60 mL/min    Comment: (NOTE) Calculated using the CKD-EPI Creatinine Equation (2021)    Anion gap 11 5 - 15    Comment: Performed at [facility], [address] [place], [place] [address]  CBC     Status: Abnormal   Collection Time: 10/02/22  4:11 PM  Result Value Ref Range   WBC 5.6 4.0 - 10.5 K/uL   RBC 5.49 4.22 - 5.81 MIL/uL   Hemoglobin 14.9 13.0 - 17.0 g/dL   HCT 47.4 25.9 - 56.3 %   MCV 78.5 (L) 80.0 - 100.0 fL   MCH 27.1 26.0 - 34.0 pg   MCHC 34.6 30.0 - 36.0 g/dL   RDW 87.5 64.3 - 32.9 %   Platelets 117 (L) 150 - 400 K/uL    Comment: REPEATED TO VERIFY   nRBC 0.0 0.0 - 0.2 %    Comment: Performed at [facility], [address] [place], [place] [address]  Troponin I (High Sensitivity)     Status: Abnormal   Collection Time: 10/02/22  4:11 PM  Result Value Ref Range   Troponin I (High Sensitivity) 28 (H) <18 ng/L     Comment: (NOTE) Elevated high sensitivity troponin I (hsTnI) values and significant  changes across serial measurements may suggest ACS but many other  chronic and acute conditions are known to elevate hsTnI results.  Refer to the "Links" section for  chest pain algorithms and additional  guidance. Performed at [facility], [address] [place], [place] [address]   Hemoglobin A1c     Status: Abnormal   Collection Time: 10/02/22  4:44 PM  Result Value Ref Range   Hgb A1c MFr Bld 6.2 (H) 4.8 - 5.6 %    Comment: (NOTE) Pre diabetes:          5.7%-6.4%  Diabetes:              >6.4%  Glycemic control for   <7.0% adults with diabetes    Mean Plasma Glucose 131.24 mg/dL    Comment: Performed at [facility], [address], [place], [place] [address]  Troponin I (High Sensitivity)     Status: Abnormal   Collection Time: 10/02/22  6:40 PM  Result Value Ref Range   Troponin I (High Sensitivity) 28 (H) <18 ng/L    Comment: (NOTE) Elevated high sensitivity troponin I (hsTnI) values and significant  changes across serial measurements may suggest ACS but many other  chronic and acute conditions are known to elevate hsTnI results.  Refer to the "Links" section for chest pain algorithms and additional  guidance. Performed at [facility], [address], [place], [place] [address]   Rapid urine drug screen (hospital performed)     Status: None   Collection Time: 10/02/22  8:25 PM  Result Value Ref Range   Opiates NONE DETECTED NONE DETECTED   Cocaine NONE DETECTED NONE DETECTED   Benzodiazepines NONE DETECTED NONE DETECTED   Amphetamines NONE DETECTED NONE DETECTED   Tetrahydrocannabinol NONE DETECTED NONE DETECTED   Barbiturates NONE DETECTED NONE DETECTED    Comment: (NOTE) DRUG SCREEN FOR MEDICAL PURPOSES ONLY.  IF CONFIRMATION IS NEEDED FOR ANY PURPOSE, NOTIFY LAB WITHIN 5 DAYS.  LOWEST DETECTABLE LIMITS FOR URINE DRUG SCREEN Drug Class                      Cutoff (ng/mL) Amphetamine and metabolites    1000 Barbiturate and metabolites    200 Benzodiazepine                 200 Opiates and metabolites        300 Cocaine and metabolites        300 THC                            50 Performed at [facility], [address] [place], [place] [address]    DG Chest Portable 1 View  Result Date: 10/02/2022 CLINICAL DATA:  Hypertensive crisis EXAM: PORTABLE CHEST 1 VIEW COMPARISON:  10/01/2022 FINDINGS: Lungs are clear. No pneumothorax or pleural effusion. Cardiac size within normal limits. No pneumothorax or pleural effusion. Mild eventration of the right hemidiaphragm. No acute bone abnormality. IMPRESSION: 1. No active disease. Electronically Signed   By: Helyn Numbers M.D.   On: 10/02/2022 20:59   DG Chest 2 View  Result Date: 10/02/2022 CLINICAL DATA:  Chest x-ray 09/03/2017 EXAM: CHEST - 2 VIEW COMPARISON:  None Available. FINDINGS: The heart size and mediastinal contours are within normal limits. Both lungs are clear. The visualized skeletal structures are unremarkable. IMPRESSION: No active cardiopulmonary disease. Electronically Signed   By: Darliss Cheney M.D.   On: 10/02/2022 00:01    Pending Labs [employer] (From admission, onward)     Start     Ordered  10/04/22 0500  Basic metabolic panel  Tomorrow morning,   R        10/03/22 1122   10/03/22 0114  TSH  Add-on,   AD        10/03/22 0113   10/03/22 0114  Lipid panel  Add-on,   AD        10/03/22 0113            Vitals/Pain Today's Vitals   10/03/22 1051 10/03/22 1100 10/03/22 1130 10/03/22 1200  BP:  (!) 176/79 (!) 148/78 (!) 170/85  Pulse:  66 (!) 54 60  Resp:  18 16 18   Temp: (!) 97.5 F (36.4 C)     TempSrc:      SpO2:  100% 98% 99%  Weight:      Height:      PainSc:        Isolation Precautions No active isolations  Medications Medications  tamsulosin (FLOMAX) capsule 0.4 mg (0.4 mg Oral Patient Refused/Not Given 10/03/22 0126)  carvedilol  (COREG) tablet 12.5 mg (12.5 mg Oral Given 10/03/22 0943)  irbesartan (AVAPRO) tablet 150 mg (150 mg Oral Given 10/03/22 0942)  acetaminophen (TYLENOL) tablet 650 mg (has no administration in time range)    Or  acetaminophen (TYLENOL) suppository 650 mg (has no administration in time range)  ondansetron (ZOFRAN) tablet 4 mg (has no administration in time range)    Or  ondansetron (ZOFRAN) injection 4 mg (has no administration in time range)  melatonin tablet 10 mg (has no administration in time range)  heparin injection 5,000 Units (5,000 Units Subcutaneous Given 10/03/22 0943)  furosemide (LASIX) tablet 40 mg (40 mg Oral Given 10/03/22 1152)  hydrALAZINE (APRESOLINE) injection 10 mg (10 mg Intravenous Given 10/02/22 2046)  hydrALAZINE (APRESOLINE) injection 10 mg (10 mg Intravenous Given 10/02/22 2312)  nitroGLYCERIN (NITROGLYN) 2 % ointment 0.5 inch (0.5 inches Topical Given 10/03/22 0039)  potassium chloride SA (KLOR-CON M) CR tablet 40 mEq (40 mEq Oral Given 10/03/22 1152)    Mobility walks     Focused Assessments Cardiac Assessment Handoff:    No results found for: "CKTOTAL", "CKMB", "CKMBINDEX", "TROPONINI" No results found for: "DDIMER" Does the Patient currently have chest pain? No    R Recommendations: See Admitting Provider Note  Report given to:   Additional Notes:

## 2022-10-03 NOTE — Assessment & Plan Note (Signed)
Continue with flomax 0.4 mg
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
## 2022-10-03 NOTE — Progress Notes (Signed)
   10/03/22 1559  TOC Brief Assessment  Insurance and Status Reviewed Administrator  Medicare HMO)  Patient has primary care physician Yes Jacinto Reap, [place], NP)  Home environment has been reviewed From home  Prior level of function: independent  Prior/Current Home Services No current home services  Social Determinants of Health Reivew SDOH reviewed needs interventions  Readmission risk has been reviewed Yes (N/A listed)  Transition of care needs transition of care needs identified, TOC will continue to follow

## 2022-10-03 NOTE — ED Notes (Signed)
Nitroglycerin paste removed at this time per verbal from Dr. Imogene Burn.

## 2022-10-03 NOTE — Assessment & Plan Note (Signed)
baseline SCr 1.3-1.6. reduce dose of diovan to 160 mg daily since he has been off this medication for several weeks and his Scr is 1.3. Pcp can uptitrate in the office after repeat BMP

## 2022-10-04 ENCOUNTER — Other Ambulatory Visit (HOSPITAL_COMMUNITY): Payer: Self-pay

## 2022-10-04 DIAGNOSIS — N139 Obstructive and reflux uropathy, unspecified: Secondary | ICD-10-CM | POA: Diagnosis not present

## 2022-10-04 DIAGNOSIS — I10 Essential (primary) hypertension: Secondary | ICD-10-CM | POA: Diagnosis not present

## 2022-10-04 DIAGNOSIS — N1831 Chronic kidney disease, stage 3a: Secondary | ICD-10-CM | POA: Diagnosis not present

## 2022-10-04 LAB — BASIC METABOLIC PANEL
Anion gap: 9 (ref 5–15)
BUN: 22 mg/dL (ref 8–23)
CO2: 24 mmol/L (ref 22–32)
Calcium: 9.1 mg/dL (ref 8.9–10.3)
Chloride: 107 mmol/L (ref 98–111)
Creatinine, Ser: 1.58 mg/dL — ABNORMAL HIGH (ref 0.61–1.24)
GFR, Estimated: 44 mL/min — ABNORMAL LOW (ref >60)
Glucose, Bld: 125 mg/dL — ABNORMAL HIGH (ref 70–99)
Potassium: 3.7 mmol/L (ref 3.5–5.1)
Sodium: 140 mmol/L (ref 135–145)

## 2022-10-04 MED ORDER — IRBESARTAN 75 MG PO TABS
75.0000 mg | ORAL_TABLET | Freq: Every day | ORAL | Status: DC
  Administered 2022-10-04: 75 mg via ORAL
  Filled 2022-10-04: qty 1

## 2022-10-04 MED ORDER — HYDROCHLOROTHIAZIDE 25 MG PO TABS
25.0000 mg | ORAL_TABLET | Freq: Every day | ORAL | Status: DC
  Administered 2022-10-04: 25 mg via ORAL
  Filled 2022-10-04: qty 1

## 2022-10-04 MED ORDER — VALSARTAN 160 MG PO TABS
160.0000 mg | ORAL_TABLET | Freq: Every day | ORAL | 0 refills | Status: AC
  Filled 2022-10-04: qty 30, 30d supply, fill #0

## 2022-10-04 MED ORDER — CARVEDILOL 12.5 MG PO TABS
12.5000 mg | ORAL_TABLET | Freq: Two times a day (BID) | ORAL | 0 refills | Status: DC
  Filled 2022-10-04: qty 60, 30d supply, fill #0

## 2022-10-04 MED ORDER — HYDROCHLOROTHIAZIDE 25 MG PO TABS
25.0000 mg | ORAL_TABLET | Freq: Every day | ORAL | 0 refills | Status: AC
  Filled 2022-10-04: qty 30, 30d supply, fill #0

## 2022-10-04 NOTE — TOC Transition Note (Signed)
Transition of Care [facility]) - CM/SW Discharge Note   Patient Details  Name: PHUOC PELLICANO MRN: [id number] Date of Birth: [birth year]  [facility]) CM/SW Contact:  Gordy Clement, RN Phone Number: 10/04/2022, 11:12 AM   Clinical Narrative:     Patient to DC to home today . There are no TOC needs. Daughter in [place] will be transporting but cannot be here until after 4:00  She has his car and keys to get in so TOC cannot provide cab to get home with no way of patient getting in.            Patient Goals and CMS Choice      Discharge Placement                         Discharge Plan and Services Additional resources added to the After Visit Summary for                                       Social Determinants of Health (SDOH) Interventions SDOH Screenings   Food Insecurity: No Food Insecurity (10/03/2022)  Housing: Low Risk  (10/03/2022)  Transportation Needs: No Transportation Needs (10/03/2022)  Utilities: Not At Risk (10/03/2022)  Tobacco Use: Medium Risk (10/02/2022)     Readmission Risk Interventions     No data to display

## 2022-10-04 NOTE — Discharge Instructions (Signed)
Follow with Primary MD Cipriano Mile, NP in 7 days   Get CBC, CMP,  checked  by Primary MD next visit.    Activity: As tolerated with Full fall precautions use walker/cane & assistance as needed   Disposition Home    Diet: Heart Healthy /carb modified .   On your next visit with your primary care physician please Get Medicines reviewed and adjusted.   Please request your Prim.MD to go over all Hospital Tests and Procedure/Radiological results at the follow up, please get all Hospital records sent to your Prim MD by signing hospital release before you go home.   If you experience worsening of your admission symptoms, develop shortness of breath, life threatening emergency, suicidal or homicidal thoughts you must seek medical attention immediately by calling 911 or calling your MD immediately  if symptoms less severe.  You Must read complete instructions/literature along with all the possible adverse reactions/side effects for all the Medicines you take and that have been prescribed to you. Take any new Medicines after you have completely understood and accpet all the possible adverse reactions/side effects.   Do not drive, operating heavy machinery, perform activities at heights, swimming or participation in water activities or provide baby sitting services if your were admitted for syncope or siezures until you have seen by Primary MD or a Neurologist and advised to do so again.  Do not drive when taking Pain medications.    Do not take more than prescribed Pain, Sleep and Anxiety Medications  Special Instructions: If you have smoked or chewed Tobacco  in the last 2 yrs please stop smoking, stop any regular Alcohol  and or any Recreational drug use.  Wear Seat belts while driving.   Please note  You were cared for by a hospitalist during your hospital stay. If you have any questions about your discharge medications or the care you received while you were in the hospital after  you are discharged, you can call the unit and asked to speak with the hospitalist on call if the hospitalist that took care of you is not available. Once you are discharged, your primary care physician will handle any further medical issues. Please note that NO REFILLS for any discharge medications will be authorized once you are discharged, as it is imperative that you return to your primary care physician (or establish a relationship with a primary care physician if you do not have one) for your aftercare needs so that they can reassess your need for medications and monitor your lab values.

## 2022-10-04 NOTE — Care Management Obs Status (Signed)
MEDICARE OBSERVATION STATUS NOTIFICATION   Patient Details  Name: John [name] MRN: [id number] Date of Birth: [birth year]   Medicare Observation Status Notification Given:  Yes    Gordy Clement, RN 10/04/2022, 10:23 AM
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
## 2022-10-15 NOTE — Progress Notes (Unsigned)
Cardiology Office Note    Patient Name: John [name] Date of Encounter: 10/17/2022  Primary Care Provider:  Hillery Aldo, NP Primary Cardiologist:  None Primary Electrophysiologist: None   Past Medical History    Past Medical History:  Diagnosis Date   Allergic rhinitis    Allergy    Anxiety    no per pt    Aortic atherosclerosis (HCC)    Back pain    BPH (benign prostatic hyperplasia)    CHF (congestive heart failure) (HCC)    Chronic kidney disease    Glaucoma    Gynecomastia    Hemorrhoids    HLD (hyperlipidemia)    Hx of adenomatous colonic polyps    Hypercholesteremia    Hyperplasia of prostate with urinary obstruction    Hypertension NO MED--  WAS TAKES CADURA FOR  BOTH BP AND PROSTATE ---  WAS TOLD TO STOP BY DR Laverle [name]  PER WIFE   Overweight    PAD (peripheral artery disease) (HCC)    Prediabetes    Premature ventricular contractions    Prostate cancer (HCC) INITIAL DX STAGE T1c Nx Mx  ADENOCARCINOMA JAN 2003 (PT DECIDED TO PROCEED W/ SURVEILLANCE)--  FOLLOWED BY DR LES [name] , UROLOGIST   PER PT AND WIFE ---  healed with holistic meds--  BY [facility] [facility]  MD UNTIL  RECENT SYMPTOMS OF URINARY RETENTION   [name] (small bowel obstruction) (HCC)    Seborrheic keratosis    Sickle cell trait (HCC)    Sleep apnea    "doesn't wear mask" (12/07/2015)   URI (upper respiratory infection)    Urinary retention     History of Present Illness  John [name] is a 82 y.o. male with a PMH of chronic HFpEF, hypertension, anxiety, ADD, OSA (not on CPAP), sickle cell trait, CKD stage IIIb, HLD, frequent PACs who presents today for posthospital follow-up of hypertension.  John [name] was previously followed by John [name] and was seen initially 11/2021 by referral of PCP due to HFpEF.  2D echo was completed showing EF of 55-60% with mild LVH and grade 1 DD with mildly dilated LA mild aortic root dilation.  Patient also had complaints of palpitations and wore event monitor  showed brief runs of VT and SVT. Patient was continued on carvedilol for management.  He presented to the ED recently on 10/02/2022 due to elevated blood pressure.  He reported not taking his medications for several weeks due to the recent passing of his son from an MI 3 weeks prior.  Patient's blood pressure was 203/96 and troponins were elevated likely due to demand ischemia.  He was administered IV hydralazine with improvement to BP.  Chest x-ray was completed and normal low concern for ACS.  He was noted to have AKI and baseline creatinine of 1.58.  Diovan was decreased to 160 mg and patient was started back on carvedilol 12.5 mg due to bradycardia.  During today's visit the patient reports doing better with his blood pressures however they are was 140/64 today.  He reports compliance with his current medications and denies any adverse reactions.  He has some extremity swelling contributes to possible indiscretions with food.  He eats lots of rotisserie chickens and canned foods.  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  He also played baseball reports not sleeping well and does not feel that CPAP would work well with him.  We discussed the importance of managing salt intake and  medication compliance.  He is euvolemic today on exam but does have +1 lower extremity edema that is improved with rest.  Patient denies chest pain, palpitations, dyspnea, PND, orthopnea, nausea, vomiting, dizziness, syncope, edema, weight gain, or early satiety.  Review of Systems  Please see the history of present illness.    All other systems reviewed and are otherwise negative except as noted above.  Physical Exam    Wt Readings from Last 3 Encounters:  10/17/22 235 lb 9.6 oz (106.9 kg)  10/04/22 227 lb 1.2 oz (103 kg)  10/01/22 228 lb (103.4 kg)   VS: Vitals:   10/17/22 0812  BP: (!) 140/64  Pulse: (!) 50  SpO2: 98%  ,Body mass index is 29.84 kg/m. GEN: Well nourished, well  developed in no acute distress Neck: No JVD; No carotid bruits Pulmonary: Clear to auscultation without rales, wheezing or rhonchi  Cardiovascular: Normal rate. Regular rhythm. Normal S1. Normal S2.   Murmurs: There is no murmur.  ABDOMEN: Soft, non-tender, non-distended EXTREMITIES:  No edema; No deformity   EKG/LABS/ Recent Cardiac Studies   ECG personally reviewed by me today -none completed today     Lab Results  Component Value Date   WBC 5.6 10/02/2022   HGB 14.9 10/02/2022   HCT 43.1 10/02/2022   MCV 78.5 (L) 10/02/2022   PLT 117 (L) 10/02/2022   Lab Results  Component Value Date   CREATININE 1.58 (H) 10/04/2022   BUN 22 10/04/2022   NA 140 10/04/2022   K 3.7 10/04/2022   CL 107 10/04/2022   CO2 24 10/04/2022   Lab Results  Component Value Date   CHOL 180 08/29/2017   HDL 49.40 08/29/2017   LDLCALC 119 (H) 08/29/2017   LDLDIRECT 140.5 01/23/2008   TRIG 54.0 08/29/2017   CHOLHDL 4 08/29/2017    Lab Results  Component Value Date   HGBA1C 6.2 (H) 10/02/2022   Assessment & Plan    1.  Essential hypertension: -Patient's blood pressure today was 140/64 -Patient was advised to continue carvedilol 12.5 mg twice daily, HCTZ 25 mg daily, valsartan 160 mg daily -He will continue to monitor his blood pressures for now and we will plan to increase medications if blood pressures remain elevated.  He will submit readings to our office in 2 weeks. -He was also advised to look at the DASHdiet plan to assist in male management and to decrease sodium consumption.  2.  HFpEF: -2D echo completed 09/2022 with EF of 50-55% with no RWMA and mild concentric LVH with grade 2 DD mild aortic dilation of 40 mm -Patient will need repeat surveillance CT or echo. -Today patient is euvolemic with lower extremity edema present. -We will have him take Lasix 40 mg x 2 days along with potassium 20 mEq and return back to as needed. -We will check BMET and BNP today today -Low sodium diet,  fluid restriction <2L, and daily weights encouraged. Educated to contact our office for weight gain of 2 lbs overnight or 5 lbs in one week.   3.  CKD stage IIIa: -Patient's last creatinine was 1.5 -We discussed possible referral to nephrology and patient would like to think about this further. -We will check BMET today  4.  Palpitations: -Today patient reports occasional palpitations but currently are reduced. -Continue carvedilol 12.5 mg twice daily  Disposition: Follow-up with None or APP in 1 months    Signed, Napoleon Form, Leodis Rains, NP 10/17/2022, 8:52 AM [facility]

## 2022-10-17 ENCOUNTER — Encounter: Payer: Self-pay | Admitting: Nurse Practitioner

## 2022-10-17 ENCOUNTER — Ambulatory Visit: Payer: Medicare HMO | Attending: Nurse Practitioner | Admitting: Nurse Practitioner

## 2022-10-17 VITALS — BP 140/64 | HR 50 | Ht 74.5 in | Wt 235.6 lb

## 2022-10-17 DIAGNOSIS — I5032 Chronic diastolic (congestive) heart failure: Secondary | ICD-10-CM

## 2022-10-17 DIAGNOSIS — I10 Essential (primary) hypertension: Secondary | ICD-10-CM

## 2022-10-17 DIAGNOSIS — R002 Palpitations: Secondary | ICD-10-CM

## 2022-10-17 DIAGNOSIS — N1831 Chronic kidney disease, stage 3a: Secondary | ICD-10-CM

## 2022-10-17 NOTE — Patient Instructions (Addendum)
Medication Instructions:  INCREASE Lasix to 40mg  daily for 2 days then go back to as needed make sure you take potassium when taking Lasix  *If you need a refill on your cardiac medications before your next appointment, please call your pharmacy*   Lab Work: TODAY-BMET & BNP If you have labs (blood work) drawn today and your tests are completely normal, you will receive your results only by: MyChart Message (if you have MyChart) OR A paper copy in the mail If you have any lab test that is abnormal or we need to change your treatment, we will call you to review the results.   Testing/Procedures: NONE ORDERED   Follow-Up: At [facility], you and your health needs are our priority.  As part of our continuing mission to provide you with exceptional heart care, we have created designated Provider Care Teams.  These Care Teams include your primary Cardiologist (physician) and Advanced Practice Providers (APPs -  Physician Assistants and Nurse Practitioners) who all work together to provide you with the care you need, when you need it.  We recommend signing up for the patient portal called "MyChart".  Sign up information is provided on this After Visit Summary.  MyChart is used to connect with patients for Virtual Visits (Telemedicine).  Patients are able to view lab/test results, encounter notes, upcoming appointments, etc.  Non-urgent messages can be sent to your provider as well.   To learn more about what you can do with MyChart, go to [email or web address].    Your next appointment:   1 month(s)  Provider:   Robin Searing, NP     Then, Donato Schultz, MD will plan to see you again in 6 month(s).    Other Instructions You have been referred to Kidney specialist.  Check your blood pressure daily for 2 weeks, then contact the office with your readings.  Make sure to check 2 hours after your medications.   AVOID these things for 30 minutes before checking your blood pressure: No  Drinking caffeine. No Drinking alcohol. No Eating. No Smoking. No Exercising.  Five minutes before checking your blood pressure: Pee. Sit in a dining chair. Avoid sitting in a soft couch or armchair. Be quiet. Do not talk.

## 2022-10-18 LAB — BASIC METABOLIC PANEL
BUN/Creatinine Ratio: 15 (ref 10–24)
BUN: 23 mg/dL (ref 8–27)
CO2: 27 mmol/L (ref 20–29)
Calcium: 9.6 mg/dL (ref 8.6–10.2)
Chloride: 102 mmol/L (ref 96–106)
Creatinine, Ser: 1.56 mg/dL — ABNORMAL HIGH (ref 0.76–1.27)
Glucose: 113 mg/dL — ABNORMAL HIGH (ref 70–99)
Potassium: 3.8 mmol/L (ref 3.5–5.2)
Sodium: 141 mmol/L (ref 134–144)
eGFR: 45 mL/min/{1.73_m2} — ABNORMAL LOW (ref >59)

## 2022-10-18 LAB — PRO B NATRIURETIC PEPTIDE: NT-Pro BNP: 426 pg/mL (ref 0–486)

## 2022-11-18 NOTE — Progress Notes (Unsigned)
Cardiology Office Note    Patient Name: John [name] Date of Encounter: 11/20/2022  Primary Care Provider:  Hillery Aldo, NP Primary Cardiologist:  None Primary Electrophysiologist: None   Past Medical History    Past Medical History:  Diagnosis Date   Allergic rhinitis    Allergy    Anxiety    no per pt    Aortic atherosclerosis (HCC)    Back pain    BPH (benign prostatic hyperplasia)    CHF (congestive heart failure) (HCC)    Chronic kidney disease    Glaucoma    Gynecomastia    Hemorrhoids    HLD (hyperlipidemia)    Hx of adenomatous colonic polyps    Hypercholesteremia    Hyperplasia of prostate with urinary obstruction    Hypertension NO MED--  WAS TAKES CADURA FOR  BOTH BP AND PROSTATE ---  WAS TOLD TO STOP BY DR Laverle Patter  PER WIFE   Overweight    PAD (peripheral artery disease) (HCC)    Prediabetes    Premature ventricular contractions    Prostate cancer (HCC) INITIAL DX STAGE T1c Nx Mx  ADENOCARCINOMA JAN 2003 (PT DECIDED TO PROCEED W/ SURVEILLANCE)--  FOLLOWED BY DR LES [name] , UROLOGIST   PER PT AND WIFE ---  healed with holistic meds--  BY [place]  MD UNTIL  RECENT SYMPTOMS OF URINARY RETENTION   [name] (small bowel obstruction) (HCC)    Seborrheic keratosis    Sickle cell trait (HCC)    Sleep apnea    "doesn't wear mask" (12/07/2015)   URI (upper respiratory infection)    Urinary retention     History of Present Illness  John [name] is a 82 y.o. male with a PMH of chronic HFpEF, hypertension, anxiety, ADD, OSA (not on CPAP), sickle cell trait, CKD stage IIIb, HLD, frequent PACs who presents today for 1 month follow-up of hypertension.  John [name] was seen last on 10/17/22 after being seen in the ED for elevated BP.  During his visit his blood pressure was elevated at 140/64 and patient reported dietary indiscretions with salt.  We discussed sleep apnea however patient felt that CPAP would not work well for him.  He was advised to monitor BP over 2  weeks with plan to increase carvedilol if remaining elevated.  He was also referred to nephrology due to elevated creatinine.  During today's visit the patient reports that he has been doing well with no new cardiac complaints since previous follow-up.  His blood pressures today were initially elevated at 150/64 and was 138/80 on recheck.  He reports his BP was elevated due to rushing here from home to the clinic.  He reported compliance with his current medications however in reviewing patient had not been taking his evening dose of carvedilol.  We reviewed his current regimen and patient was provided a pillbox to remind him for evening doses.  Has been staying active with exercise and is working out at home cardio and sit ups.  He is also going to the gym and walking.  He is euvolemic on examination today and weight is down 10 pounds from previous follow-up.  He reports improvement with his diet and has been cognizant of his salt intake.  He does report experiencing palpitations that are more prominent with laying on his left side and reposition.  Patient denies chest pain, palpitations, dyspnea, PND, orthopnea, nausea, vomiting, dizziness, syncope, edema, weight gain, or early satiety.   Review of Systems  Please  see the history of present illness.    All other systems reviewed and are otherwise negative except as noted above.  Physical Exam    Wt Readings from Last 3 Encounters:  11/20/22 230 lb 12.8 oz (104.7 kg)  10/17/22 235 lb 9.6 oz (106.9 kg)  10/04/22 227 lb 1.2 oz (103 kg)   VS: Vitals:   11/20/22 1004  BP: (!) 150/64  Pulse: 68  SpO2: 98%  ,Body mass index is 29.24 kg/m. GEN: Well nourished, well developed in no acute distress Neck: No JVD; No carotid bruits Pulmonary: Clear to auscultation without rales, wheezing or rhonchi  Cardiovascular: Normal rate. Regular rhythm. Normal S1. Normal S2.   Murmurs: There is no murmur.  ABDOMEN: Soft, non-tender,  non-distended EXTREMITIES:  No edema; No deformity   EKG/LABS/ Recent Cardiac Studies   ECG personally reviewed by me today -none completed today  Risk Assessment/Calculations:          Lab Results  Component Value Date   WBC 5.6 10/02/2022   HGB 14.9 10/02/2022   HCT 43.1 10/02/2022   MCV 78.5 (L) 10/02/2022   PLT 117 (L) 10/02/2022   Lab Results  Component Value Date   CREATININE 1.56 (H) 10/17/2022   BUN 23 10/17/2022   NA 141 10/17/2022   K 3.8 10/17/2022   CL 102 10/17/2022   CO2 27 10/17/2022   Lab Results  Component Value Date   CHOL 180 08/29/2017   HDL 49.40 08/29/2017   LDLCALC 119 (H) 08/29/2017   LDLDIRECT 140.5 01/23/2008   TRIG 54.0 08/29/2017   CHOLHDL 4 08/29/2017    Lab Results  Component Value Date   HGBA1C 6.2 (H) 10/02/2022   Assessment & Plan    1..  Essential hypertension: -Patient's blood pressure today was initially elevated at 150/64 and was 138/80 on recheck. -Patient reports at home his blood pressures have been running in the 130s over 160s systolically. -In reviewing his medication regimen he was not taking his evening dose of carvedilol and was advised to get a pillbox to remind him of his evening dose. -Continue carvedilol 12.5 mg twice daily, HCTZ 25 mg daily and valsartan 160 mg daily -Patient advised to abstain from excess salt in diet and continue exercise.  2.  HFpEF: -2D echo completed 09/2022 with EF of 50-55% with no RWMA and mild concentric LVH with grade 2 DD mild aortic dilation of 40 mm -Patient has improvement to volume status and is down 10 pounds from previous follow-up. -We will have him continue to take Lasix 40 mg as needed and also potassium 20 mEq as needed -Low sodium diet, fluid restriction <2L, and daily weights encouraged. Educated to contact our office for weight gain of 2 lbs overnight or 5 lbs in one week.    3.  CKD stage IIIa: -Patient's last creatinine was 1.5 -He was referred to nephrology however  has not received a call and was provided contact information and advised to give them a call today.  4.  Palpitations: -Today patient reports occasional palpitations but currently are reduced. -Patient reports increase in palpitations while laying on his left side with repositioning. -Have patient wear a 7-day ZIO to evaluate for arrhythmia. -Continue carvedilol 12.5 mg twice daily  5.  Dilated aortic root: -Patient's previous 2D echo completed 09/2022 with plan to complete surveillance CT or echo -Patient will repeat 2D echo in 1 year.  Disposition: Follow-up with None or APP in 6 months    Signed, Louanne Skye  Leavy Cella, NP 11/20/2022, 10:27 AM  [facility]

## 2022-11-20 ENCOUNTER — Encounter: Payer: Self-pay | Admitting: Nurse Practitioner

## 2022-11-20 ENCOUNTER — Ambulatory Visit (INDEPENDENT_AMBULATORY_CARE_PROVIDER_SITE_OTHER): Payer: Medicare HMO

## 2022-11-20 ENCOUNTER — Ambulatory Visit: Payer: Medicare HMO | Attending: Nurse Practitioner | Admitting: Nurse Practitioner

## 2022-11-20 VITALS — BP 138/80 | HR 68 | Ht 74.5 in | Wt 230.8 lb

## 2022-11-20 DIAGNOSIS — I77810 Thoracic aortic ectasia: Secondary | ICD-10-CM

## 2022-11-20 DIAGNOSIS — N1831 Chronic kidney disease, stage 3a: Secondary | ICD-10-CM | POA: Diagnosis not present

## 2022-11-20 DIAGNOSIS — I10 Essential (primary) hypertension: Secondary | ICD-10-CM | POA: Diagnosis not present

## 2022-11-20 DIAGNOSIS — R002 Palpitations: Secondary | ICD-10-CM

## 2022-11-20 DIAGNOSIS — I5032 Chronic diastolic (congestive) heart failure: Secondary | ICD-10-CM

## 2022-11-20 NOTE — Progress Notes (Unsigned)
Applied a 7 day Zio XT monitor to patient in the in the office  [place] to read

## 2022-11-20 NOTE — Patient Instructions (Addendum)
Medication Instructions:  Pill organizer given during appointment  Your physician recommends that you continue on your current medications as directed. Please refer to the Current Medication list given to you today. *If you need a refill on your cardiac medications before your next appointment, please call your pharmacy*   Lab Work: None ordered   Testing/Procedures: ZIO XT- Long Term Monitor Instructions  Your physician has requested you wear a ZIO patch monitor for 14 days.  This is a single patch monitor. [employer] supplies one patch monitor per enrollment. Additional stickers are not available. Please do not apply patch if you will be having a Nuclear Stress Test,  Echocardiogram, Cardiac CT, MRI, or Chest Xray during the period you would be wearing the  monitor. The patch cannot be worn during these tests. You cannot remove and re-apply the  ZIO XT patch monitor.  Your ZIO patch monitor will be mailed 3 day USPS to your address on file. It may take 3-5 days  to receive your monitor after you have been enrolled.  Once you have received your monitor, please review the enclosed instructions. Your monitor  has already been registered assigning a specific monitor serial # to you.  Billing and Patient Assistance Program Information  We have supplied [employer] with any of your insurance information on file for billing purposes. [employer] offers a sliding scale Patient Assistance Program for patients that do not have  insurance, or whose insurance does not completely cover the cost of the ZIO monitor.  You must apply for the Patient Assistance Program to qualify for this discounted rate.  To apply, please call [employer] at [phone], select option 4, select option 2, ask to apply for  Patient Assistance Program. Meredeth Ide will ask your household income, and how many people  are in your household. They will quote your out-of-pocket cost based on that information.  [employer] will also be able to  set up a 35-month, interest-free payment plan if needed.  Applying the monitor   Shave hair from upper left chest.  Hold abrader disc by orange tab. Rub abrader in 40 strokes over the upper left chest as  indicated in your monitor instructions.  Clean area with 4 enclosed alcohol pads. Let dry.  Apply patch as indicated in monitor instructions. Patch will be placed under collarbone on left  side of chest with arrow pointing upward.  Rub patch adhesive wings for 2 minutes. Remove white label marked "1". Remove the white  label marked "2". Rub patch adhesive wings for 2 additional minutes.  While looking in a mirror, press and release button in center of patch. A small green light will  flash 3-4 times. This will be your only indicator that the monitor has been turned on.  Do not shower for the first 24 hours. You may shower after the first 24 hours.  Press the button if you feel a symptom. You will hear a small click. Record Date, Time and  Symptom in the Patient Logbook.  When you are ready to remove the patch, follow instructions on the last 2 pages of Patient  Logbook. Stick patch monitor onto the last page of Patient Logbook.  Place Patient Logbook in the blue and white box. Use locking tab on box and tape box closed  securely. The blue and white box has prepaid postage on it. Please place it in the mailbox as  soon as possible. Your physician should have your test results approximately 7 days after the  monitor has  been mailed back to [place].  Call [facility] Customer Care at [phone] if you have questions regarding  your ZIO XT patch monitor. Call them immediately if you see an orange light blinking on your  monitor.  If your monitor falls off in less than 4 days, contact our Monitor department at [phone].  If your monitor becomes loose or falls off after 4 days call [employer] at [phone] for  suggestions on securing your monitor    Follow-Up: At [facility], you and your health needs are our priority.  As part of our continuing mission to provide you with exceptional heart care, we have created designated Provider Care Teams.  These Care Teams include your primary Cardiologist (physician) and Advanced Practice Providers (APPs -  Physician Assistants and Nurse Practitioners) who all work together to provide you with the care you need, when you need it.  We recommend signing up for the patient portal called "MyChart".  Sign up information is provided on this After Visit Summary.  MyChart is used to connect with patients for Virtual Visits (Telemedicine).  Patients are able to view lab/test results, encounter notes, upcoming appointments, etc.  Non-urgent messages can be sent to your provider as well.   To learn more about what you can do with MyChart, go to [email or web address].    Your next appointment:   6 month(s)  Provider:   Donato Schultz, MD   Other Instructions [facility] [phone]  Bring your blood pressure cuff to your next appointment    Please check your weight daily. Please contact the office if you gain more than 2lbs in a day or 5lbs in a week.  Limit your salt intake to 1500-2000mg  per day or 500mg  of Sodium per meal.  Check your blood pressure daily for 2 weeks, then contact the office with your readings.  Make sure to check 2 hours after your medications.   AVOID these things for 30 minutes before checking your blood pressure: No Drinking caffeine. No Drinking alcohol. No Eating. No Smoking. No Exercising.  Five minutes before checking your blood pressure: Pee. Sit in a dining chair. Avoid sitting in a soft couch or armchair. Be quiet. Do not talk.

## 2022-12-06 ENCOUNTER — Ambulatory Visit: Payer: Medicare HMO | Admitting: Podiatry

## 2022-12-06 ENCOUNTER — Encounter: Payer: Self-pay | Admitting: Podiatry

## 2022-12-06 DIAGNOSIS — B351 Tinea unguium: Secondary | ICD-10-CM

## 2022-12-06 DIAGNOSIS — M79674 Pain in right toe(s): Secondary | ICD-10-CM | POA: Diagnosis not present

## 2022-12-06 DIAGNOSIS — M79675 Pain in left toe(s): Secondary | ICD-10-CM | POA: Diagnosis not present

## 2022-12-06 NOTE — Progress Notes (Signed)
Subjective:  Patient ID: John [name], male    DOB: [birth year],  MRN: [id number]  Chief Complaint  Patient presents with   Debridement    Trim toenails    82 y.o. male presents with the above complaint. History confirmed with patient.  Nails are thick and elongated causing pain, the last debridement was quite helpful.   Objective:  Physical Exam: warm, good capillary refill, no trophic changes or ulcerative lesions, normal DP and PT pulses, normal sensory exam, and distal ingrown of right hallux nail.  No pain  Left Foot: dystrophic yellowed discolored nail plates with subungual debris Right Foot: dystrophic yellowed discolored nail plates with subungual debris   Assessment:   1. Pain due to onychomycosis of toenails of both feet      Plan:  Patient was evaluated and treated and all questions answered.  Discussed the etiology and treatment options for the condition in detail with the patient.  Prior debridements have been helpful. Recommended debridement of the nails today. Sharp and mechanical debridement performed of all painful and mycotic nails today. Nails debrided in length and thickness using a nail nipper to level of comfort. Discussed treatment options including appropriate shoe gear. Follow up in 3 months or as needed for painful nails.      Return in about 3 months (around 03/08/2023) for painful thick fungal nails.

## 2022-12-07 ENCOUNTER — Other Ambulatory Visit (HOSPITAL_COMMUNITY): Payer: Self-pay

## 2022-12-07 ENCOUNTER — Other Ambulatory Visit: Payer: Self-pay

## 2022-12-07 MED ORDER — CARVEDILOL 25 MG PO TABS
25.0000 mg | ORAL_TABLET | Freq: Two times a day (BID) | ORAL | 3 refills | Status: AC
Start: 2022-12-07 — End: ?
  Filled 2022-12-07: qty 60, 30d supply, fill #0

## 2022-12-19 ENCOUNTER — Other Ambulatory Visit (HOSPITAL_COMMUNITY): Payer: Self-pay

## 2023-01-27 LAB — LAB REPORT - SCANNED
Creatinine, POC: 141.4 mg/dL
EGFR: 44

## 2023-01-28 ENCOUNTER — Other Ambulatory Visit: Payer: Self-pay | Admitting: Nephrology

## 2023-01-28 ENCOUNTER — Other Ambulatory Visit: Payer: Self-pay | Admitting: Registered Nurse

## 2023-01-28 DIAGNOSIS — R3129 Other microscopic hematuria: Secondary | ICD-10-CM

## 2023-01-28 DIAGNOSIS — Z87438 Personal history of other diseases of male genital organs: Secondary | ICD-10-CM

## 2023-01-28 DIAGNOSIS — N1831 Chronic kidney disease, stage 3a: Secondary | ICD-10-CM

## 2023-03-05 ENCOUNTER — Ambulatory Visit (INDEPENDENT_AMBULATORY_CARE_PROVIDER_SITE_OTHER): Payer: Medicare HMO | Admitting: Podiatry

## 2023-03-05 VITALS — Ht 74.25 in

## 2023-03-05 DIAGNOSIS — M21612 Bunion of left foot: Secondary | ICD-10-CM

## 2023-03-05 DIAGNOSIS — M2012 Hallux valgus (acquired), left foot: Secondary | ICD-10-CM | POA: Diagnosis not present

## 2023-03-05 DIAGNOSIS — B351 Tinea unguium: Secondary | ICD-10-CM

## 2023-03-05 NOTE — Progress Notes (Signed)
  Subjective:  Patient ID: John [name], male    DOB: 10-19-1940,  MRN: [id number]  Chief Complaint  Patient presents with   Nail Problem    He is here for a nail trim today,     83 y.o. male presents with the above complaint. History confirmed with patient.  Nails are thick and elongated causing pain, the last debridement was quite helpful.  Today he has a new issue of a bunion developing on the left foot that is becoming painful.  Worse with pressure from shoes  Objective:  Physical Exam: warm, good capillary refill, no trophic changes or ulcerative lesions, normal DP and PT pulses, normal sensory exam, and left he has hallux valgus deformity with a medial bunion.  Pain over the medial eminence.  Good range of motion of joint without pain. Left Foot: dystrophic yellowed discolored nail plates with subungual debris Right Foot: dystrophic yellowed discolored nail plates with subungual debris   Assessment:   1. Hallux valgus with bunions, left   2. Pain due to onychomycosis of toenails of both feet       Plan:  Patient was evaluated and treated and all questions answered.  Discussed the etiology and treatment options for the condition in detail with the patient.  Prior debridements have been helpful. Recommended debridement of the nails today. Sharp and mechanical debridement performed of all painful and mycotic nails today. Nails debrided in length and thickness using a nail nipper to level of comfort. Discussed treatment options including appropriate shoe gear. Follow up in 3 months or as needed for painful nails.   Today we also had a new issue that we discussed separately of a painful bunion the left foot.  I discussed nonoperative treatment with him utilizing a bunion shield we discussed appropriate shoes and utilizing OTC anti-inflammatories Tylenol and Voltaren gel.  He will let me know if this worsens.   Return in about 3 months (around 06/03/2023) for at risk diabetic foot  care.

## 2023-03-05 NOTE — Patient Instructions (Signed)
A bunion shield can be purchased on [employer] or at [employer] and covers over the bump to take pressure off of it from shoes    Look for Voltaren gel at the pharmacy over the counter or online (also known as diclofenac 1% gel). Apply to the painful areas 3-4x daily with the supplied dosing card. Allow to dry for 10 minutes before going into socks/shoes

## 2023-06-04 ENCOUNTER — Ambulatory Visit (INDEPENDENT_AMBULATORY_CARE_PROVIDER_SITE_OTHER): Payer: Medicare HMO | Admitting: Podiatry

## 2023-06-04 ENCOUNTER — Encounter: Payer: Self-pay | Admitting: Podiatry

## 2023-06-04 DIAGNOSIS — M79675 Pain in left toe(s): Secondary | ICD-10-CM | POA: Diagnosis not present

## 2023-06-04 DIAGNOSIS — B351 Tinea unguium: Secondary | ICD-10-CM | POA: Diagnosis not present

## 2023-06-04 DIAGNOSIS — M79674 Pain in right toe(s): Secondary | ICD-10-CM | POA: Diagnosis not present

## 2023-06-04 NOTE — Progress Notes (Signed)
  Subjective:  Patient ID: John [name], male    DOB: July 23, 1940,  MRN: [id number]  Chief Complaint  Patient presents with   Diabetes    Patient is here for [facility]    83 y.o. male presents with the above complaint. History confirmed with patient.  Nails are thick and elongated causing pain, the last debridement was quite helpful.    Objective:  Physical Exam: warm, good capillary refill, no trophic changes or ulcerative lesions, normal DP and PT pulses, normal sensory exam, and left he has hallux valgus deformity with a medial bunion.  Pain over the medial eminence.  Good range of motion of joint without pain. Left Foot: dystrophic yellowed discolored nail plates with subungual debris Right Foot: dystrophic yellowed discolored nail plates with subungual debris   Assessment:   1. Pain due to onychomycosis of toenails of both feet       Plan:  Patient was evaluated and treated and all questions answered.  Discussed the etiology and treatment options for the condition in detail with the patient.  Prior debridements have been helpful. Recommended debridement of the nails today. Sharp and mechanical debridement performed of all painful and mycotic nails today. Nails debrided in length and thickness using a nail nipper to level of comfort. Discussed treatment options including appropriate shoe gear. Follow up in 3 months or as needed for painful nails.      Return in about 3 months (around 09/03/2023) for at risk diabetic foot care.

## 2023-09-03 ENCOUNTER — Ambulatory Visit (INDEPENDENT_AMBULATORY_CARE_PROVIDER_SITE_OTHER): Admitting: Podiatry

## 2023-09-03 VITALS — Ht 74.25 in | Wt 230.0 lb

## 2023-09-03 DIAGNOSIS — M79674 Pain in right toe(s): Secondary | ICD-10-CM | POA: Diagnosis not present

## 2023-09-03 DIAGNOSIS — M79675 Pain in left toe(s): Secondary | ICD-10-CM | POA: Diagnosis not present

## 2023-09-03 DIAGNOSIS — B351 Tinea unguium: Secondary | ICD-10-CM

## 2023-09-04 NOTE — Progress Notes (Signed)
  Subjective:  Patient ID: John [name], male    DOB: 04/26/1940,  MRN: [id number]  Chief Complaint  Patient presents with   Flat Foot   Nail Problem    Rm 4 Patient is here for diabetic foot care and nail trimmings.    83 y.o. male presents with the above complaint. History confirmed with patient.  Nails are thick and elongated causing pain, the last debridement was quite helpful.    Objective:  Physical Exam: warm, good capillary refill, no trophic changes or ulcerative lesions, normal DP and PT pulses, normal sensory exam Left Foot: dystrophic yellowed discolored nail plates with subungual debris Right Foot: dystrophic yellowed discolored nail plates with subungual debris   Assessment:   1. Pain due to onychomycosis of toenails of both feet       Plan:  Patient was evaluated and treated and all questions answered.  Discussed the etiology and treatment options for the condition in detail with the patient.  Prior debridements have been helpful. Recommended debridement of the nails today. Sharp and mechanical debridement performed of all painful and mycotic nails today. Nails debrided in length and thickness using a nail nipper to level of comfort. Discussed treatment options including appropriate shoe gear. Follow up in 3 months or as needed for painful nails.      Return in about 3 months (around 12/04/2023) for at risk diabetic foot care.

## 2023-12-03 ENCOUNTER — Ambulatory Visit: Admitting: Podiatry

## 2024-01-23 ENCOUNTER — Ambulatory Visit: Admitting: Podiatry

## 2024-01-23 VITALS — Ht 74.25 in | Wt 230.0 lb

## 2024-01-23 DIAGNOSIS — B351 Tinea unguium: Secondary | ICD-10-CM

## 2024-01-23 DIAGNOSIS — M79674 Pain in right toe(s): Secondary | ICD-10-CM | POA: Diagnosis not present

## 2024-01-23 DIAGNOSIS — M79675 Pain in left toe(s): Secondary | ICD-10-CM | POA: Diagnosis not present

## 2024-01-28 NOTE — Progress Notes (Signed)
°  Subjective:  Patient ID: John [name], male    DOB: 1940-02-25,  MRN: [id number]  Chief Complaint  Patient presents with   Ingrown Toenail    RM 8 Patient is here for bilateral ingrown toe nails. Pt states on both borders of the right/left hallux. Pt would like the right 2nd and third left toenail trimmed.    83 y.o. male presents with the above complaint. History confirmed with patient.  Nails are thick and elongated causing pain, the last debridement was quite helpful.    Objective:  Physical Exam: warm, good capillary refill, no trophic changes or ulcerative lesions, normal DP and PT pulses, normal sensory exam Left Foot: dystrophic yellowed discolored nail plates with subungual debris Right Foot: dystrophic yellowed discolored nail plates with subungual debris   Assessment:   1. Pain due to onychomycosis of toenails of both feet       Plan:  Patient was evaluated and treated and all questions answered.  Discussed the etiology and treatment options for the condition in detail with the patient.  Prior debridements have been helpful. Recommended debridement of the nails today. Sharp and mechanical debridement performed of all painful and mycotic nails today. Nails debrided in length and thickness using a nail nipper to level of comfort. Discussed treatment options including appropriate shoe gear. Follow up in 3 months or as needed for painful nails.      Return in about 3 months (around 04/22/2024) for RFC.

## 2024-04-21 ENCOUNTER — Ambulatory Visit: Admitting: Podiatry
